# Patient Record
Sex: Female | Born: 1995 | Race: White | Hispanic: No | Marital: Single | Smoking: Never smoker
Health system: Southern US, Community
[De-identification: ages and names within clinical notes are randomized; demographics above are authoritative.]

## PROBLEM LIST (undated history)

## (undated) DIAGNOSIS — G90A Postural orthostatic tachycardia syndrome (POTS): Secondary | ICD-10-CM

## (undated) DIAGNOSIS — I498 Other specified cardiac arrhythmias: Secondary | ICD-10-CM

## (undated) DIAGNOSIS — R000 Tachycardia, unspecified: Secondary | ICD-10-CM

## (undated) DIAGNOSIS — I951 Orthostatic hypotension: Secondary | ICD-10-CM

## (undated) DIAGNOSIS — N390 Urinary tract infection, site not specified: Secondary | ICD-10-CM

## (undated) DIAGNOSIS — K3184 Gastroparesis: Secondary | ICD-10-CM

## (undated) DIAGNOSIS — R1115 Cyclical vomiting syndrome unrelated to migraine: Secondary | ICD-10-CM

## (undated) DIAGNOSIS — J45909 Unspecified asthma, uncomplicated: Secondary | ICD-10-CM

## (undated) DIAGNOSIS — J349 Unspecified disorder of nose and nasal sinuses: Secondary | ICD-10-CM

## (undated) DIAGNOSIS — Z9889 Other specified postprocedural states: Secondary | ICD-10-CM

## (undated) DIAGNOSIS — T8859XA Other complications of anesthesia, initial encounter: Secondary | ICD-10-CM

## (undated) DIAGNOSIS — Q796 Ehlers-Danlos syndrome, unspecified: Secondary | ICD-10-CM

## (undated) DIAGNOSIS — R112 Nausea with vomiting, unspecified: Secondary | ICD-10-CM

## (undated) DIAGNOSIS — R109 Unspecified abdominal pain: Secondary | ICD-10-CM

## (undated) HISTORY — PX: ADENOIDECTOMY: SHX3020

## (undated) HISTORY — DX: Unspecified disorder of nose and nasal sinuses: J34.9

## (undated) HISTORY — PX: WISDOM TOOTH EXTRACTION: SHX21

## (undated) HISTORY — PX: TONSILLECTOMY: SUR1361

## (undated) HISTORY — DX: Unspecified abdominal pain: R10.9

## (undated) HISTORY — DX: Other specified cardiac arrhythmias: I49.8

## (undated) HISTORY — PX: OTHER SURGICAL HISTORY: SHX169

---
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
## 2017-07-04 DIAGNOSIS — J45909 Unspecified asthma, uncomplicated: Secondary | ICD-10-CM | POA: Insufficient documentation

## 2017-07-04 DIAGNOSIS — Q796 Ehlers-Danlos syndrome, unspecified: Secondary | ICD-10-CM | POA: Insufficient documentation

## 2017-08-03 NOTE — Telephone Encounter (Signed)
Release of medical records.

## 2017-08-22 ENCOUNTER — Encounter: Payer: Self-pay | Admitting: Emergency Medicine

## 2017-08-22 DIAGNOSIS — I498 Other specified cardiac arrhythmias: Secondary | ICD-10-CM | POA: Diagnosis present

## 2017-08-22 DIAGNOSIS — Z87440 Personal history of urinary (tract) infections: Secondary | ICD-10-CM

## 2017-08-22 DIAGNOSIS — Z8249 Family history of ischemic heart disease and other diseases of the circulatory system: Secondary | ICD-10-CM | POA: Diagnosis not present

## 2017-08-22 DIAGNOSIS — Z30432 Encounter for removal of intrauterine contraceptive device: Secondary | ICD-10-CM

## 2017-08-22 DIAGNOSIS — R112 Nausea with vomiting, unspecified: Secondary | ICD-10-CM | POA: Diagnosis present

## 2017-08-22 DIAGNOSIS — E860 Dehydration: Secondary | ICD-10-CM | POA: Diagnosis present

## 2017-08-22 DIAGNOSIS — N3000 Acute cystitis without hematuria: Secondary | ICD-10-CM | POA: Diagnosis present

## 2017-08-22 LAB — COMPREHENSIVE METABOLIC PANEL
ALBUMIN: 5.1 g/dL — AB (ref 3.5–5.0)
ALT: 17 U/L (ref 14–54)
ANION GAP: 16 — AB (ref 5–15)
AST: 27 U/L (ref 15–41)
Alkaline Phosphatase: 75 U/L (ref 38–126)
BUN: 11 mg/dL (ref 6–20)
CHLORIDE: 100 mmol/L — AB (ref 101–111)
CO2: 20 mmol/L — ABNORMAL LOW (ref 22–32)
Calcium: 9.8 mg/dL (ref 8.9–10.3)
Creatinine, Ser: 0.87 mg/dL (ref 0.44–1.00)
GFR calc Af Amer: >60 mL/min (ref >60)
GLUCOSE: 112 mg/dL — AB (ref 65–99)
POTASSIUM: 3.6 mmol/L (ref 3.5–5.1)
Sodium: 136 mmol/L (ref 135–145)
Total Bilirubin: 1.4 mg/dL — ABNORMAL HIGH (ref 0.3–1.2)
Total Protein: 8.1 g/dL (ref 6.5–8.1)

## 2017-08-22 LAB — CBC
HEMATOCRIT: 43.3 % (ref 35.0–47.0)
HEMOGLOBIN: 14.7 g/dL (ref 12.0–16.0)
MCH: 29.6 pg (ref 26.0–34.0)
MCHC: 33.9 g/dL (ref 32.0–36.0)
MCV: 87.4 fL (ref 80.0–100.0)
Platelets: 321 10*3/uL (ref 150–440)
RBC: 4.96 MIL/uL (ref 3.80–5.20)
RDW: 12.9 % (ref 11.5–14.5)
WBC: 7.1 10*3/uL (ref 3.6–11.0)

## 2017-08-22 LAB — LIPASE, BLOOD: Lipase: 25 U/L (ref 11–51)

## 2017-08-22 NOTE — ED Triage Notes (Signed)
Pt reports she has had N/V x3 days as well as cramping. Pt reports she had IUD placed 2 weeks ago and the cramping feels related. Pt denies vaginal bleeding or discharge.

## 2017-08-23 ENCOUNTER — Encounter: Payer: Self-pay | Admitting: Emergency Medicine

## 2017-08-23 ENCOUNTER — Observation Stay
Admission: EM | Admit: 2017-08-23 | Discharge: 2017-08-26 | DRG: 392 | Disposition: A | Discharge status: Home / Self Care | Payer: 59 | Attending: Internal Medicine | Admitting: Internal Medicine

## 2017-08-23 ENCOUNTER — Emergency Department: Payer: 59

## 2017-08-23 ENCOUNTER — Other Ambulatory Visit: Payer: Self-pay

## 2017-08-23 DIAGNOSIS — R112 Nausea with vomiting, unspecified: Secondary | ICD-10-CM | POA: Diagnosis present

## 2017-08-23 DIAGNOSIS — N3000 Acute cystitis without hematuria: Secondary | ICD-10-CM

## 2017-08-23 DIAGNOSIS — R1030 Lower abdominal pain, unspecified: Secondary | ICD-10-CM

## 2017-08-23 HISTORY — DX: Tachycardia, unspecified: R00.0

## 2017-08-23 HISTORY — DX: Postural orthostatic tachycardia syndrome (POTS): G90.A

## 2017-08-23 HISTORY — DX: Other specified cardiac arrhythmias: I49.8

## 2017-08-23 HISTORY — DX: Orthostatic hypotension: I95.1

## 2017-08-23 LAB — TSH: TSH: 1.388 u[IU]/mL (ref 0.350–4.500)

## 2017-08-23 LAB — URINALYSIS, COMPLETE (UACMP) WITH MICROSCOPIC
Bilirubin Urine: NEGATIVE
Glucose, UA: NEGATIVE mg/dL
Ketones, ur: 80 mg/dL — AB
LEUKOCYTES UA: NEGATIVE
Nitrite: NEGATIVE
PH: 5 (ref 5.0–8.0)
PROTEIN: NEGATIVE mg/dL
Specific Gravity, Urine: 1.019 (ref 1.005–1.030)

## 2017-08-23 LAB — AMYLASE: Amylase: 54 U/L (ref 28–100)

## 2017-08-23 LAB — POCT PREGNANCY, URINE: Preg Test, Ur: NEGATIVE

## 2017-08-23 LAB — URINE DRUG SCREEN, QUALITATIVE (ARMC ONLY)
Amphetamines, Ur Screen: NOT DETECTED
BARBITURATES, UR SCREEN: NOT DETECTED
Benzodiazepine, Ur Scrn: NOT DETECTED
Cannabinoid 50 Ng, Ur ~~LOC~~: NOT DETECTED
Cocaine Metabolite,Ur ~~LOC~~: NOT DETECTED
MDMA (Ecstasy)Ur Screen: NOT DETECTED
METHADONE SCREEN, URINE: NOT DETECTED
OPIATE, UR SCREEN: NOT DETECTED
PHENCYCLIDINE (PCP) UR S: NOT DETECTED
Tricyclic, Ur Screen: NOT DETECTED

## 2017-08-23 LAB — LIPASE, BLOOD: Lipase: 26 U/L (ref 11–51)

## 2017-08-23 MED ORDER — ONDANSETRON HCL 4 MG/2ML IJ SOLN
4.0000 mg | Freq: Four times a day (QID) | INTRAMUSCULAR | Status: DC | PRN
  Administered 2017-08-23 – 2017-08-26 (×6): 4 mg via INTRAVENOUS
  Filled 2017-08-23 (×6): qty 2

## 2017-08-23 MED ORDER — METOCLOPRAMIDE HCL 5 MG/ML IJ SOLN
INTRAMUSCULAR | Status: AC
  Administered 2017-08-23: 10 mg via INTRAVENOUS
  Filled 2017-08-23: qty 2

## 2017-08-23 MED ORDER — HYDROCODONE-ACETAMINOPHEN 5-325 MG PO TABS: 1.0000 | ORAL_TABLET | ORAL | Status: DC | PRN

## 2017-08-23 MED ORDER — ACETAMINOPHEN 650 MG RE SUPP: 650.0000 mg | Freq: Four times a day (QID) | RECTAL | Status: DC | PRN

## 2017-08-23 MED ORDER — SODIUM CHLORIDE 0.9 % IV BOLUS (SEPSIS)
1000.0000 mL | Freq: Once | INTRAVENOUS | Status: AC
  Administered 2017-08-23: 1000 mL via INTRAVENOUS

## 2017-08-23 MED ORDER — ACETAMINOPHEN 325 MG PO TABS: 650.0000 mg | ORAL_TABLET | Freq: Four times a day (QID) | ORAL | Status: DC | PRN

## 2017-08-23 MED ORDER — SODIUM CHLORIDE 0.9 % IV SOLN
INTRAVENOUS | Status: DC
  Administered 2017-08-23 – 2017-08-25 (×7): via INTRAVENOUS

## 2017-08-23 MED ORDER — DEXTROSE 5 % IV SOLN
2.0000 g | INTRAVENOUS | Status: DC
  Administered 2017-08-24 – 2017-08-25 (×2): 2 g via INTRAVENOUS
  Filled 2017-08-23 (×2): qty 2

## 2017-08-23 MED ORDER — METOCLOPRAMIDE HCL 5 MG/ML IJ SOLN
10.0000 mg | Freq: Once | INTRAMUSCULAR | Status: AC
  Administered 2017-08-23: 10 mg via INTRAVENOUS

## 2017-08-23 MED ORDER — CEFTRIAXONE SODIUM IN DEXTROSE 20 MG/ML IV SOLN
1.0000 g | Freq: Once | INTRAVENOUS | Status: AC
  Administered 2017-08-23: 1 g via INTRAVENOUS
  Filled 2017-08-23: qty 50

## 2017-08-23 MED ORDER — PROMETHAZINE HCL 25 MG/ML IJ SOLN
25.0000 mg | Freq: Four times a day (QID) | INTRAMUSCULAR | Status: DC | PRN
  Administered 2017-08-23 – 2017-08-24 (×5): 25 mg via INTRAVENOUS
  Filled 2017-08-23 (×6): qty 1

## 2017-08-23 MED ORDER — ONDANSETRON HCL 4 MG PO TABS: 4.0000 mg | ORAL_TABLET | Freq: Four times a day (QID) | ORAL | Status: DC | PRN

## 2017-08-23 MED ORDER — IOPAMIDOL (ISOVUE-300) INJECTION 61%
75.0000 mL | Freq: Once | INTRAVENOUS | Status: AC | PRN
  Administered 2017-08-23: 75 mL via INTRAVENOUS

## 2017-08-23 MED ORDER — ONDANSETRON HCL 4 MG/2ML IJ SOLN
4.0000 mg | Freq: Once | INTRAMUSCULAR | Status: AC
  Administered 2017-08-23: 4 mg via INTRAVENOUS
  Filled 2017-08-23: qty 2

## 2017-08-23 MED ORDER — SODIUM CHLORIDE 0.9 % IV SOLN
8.0000 mg | Freq: Once | INTRAVENOUS | Status: AC
  Administered 2017-08-23: 8 mg via INTRAVENOUS
  Filled 2017-08-23: qty 4

## 2017-08-23 NOTE — ED Notes (Signed)
Pt reports pelvic pain that began 3 days ago, also states since then has had N/V. Pt states emesis is green in color at this time. Pt states she had IUD placed 2 weeks ago and reports "I don't know if that has something to do with it." Pt states hx of ovarian cyst rupture and POTS. Pt states yest she was told she had a UTI and was started on antibiotic yesterday. Pt alert and able to answer questions at this time. Pt's friends are at bedside and pt talking with them.

## 2017-08-23 NOTE — ED Notes (Signed)
Pt able to drink ginger ale, pt states she continues to have dry heaves however has not thrown up the ginger ale.

## 2017-08-23 NOTE — Progress Notes (Signed)
Same day rounding progress note  22 year old female patient with history of pots syndrome admitted with Intractable nausea and vomiting.  1.  Intractable nausea vomiting: unknown etio, zofran didn't help, added phenergan Mother concerned about IUD (Gyn Etio) and requestion 2nd opinion - d/w Dr Elesa MassedWard who will see pt - for now symptomatic mgmt. CT abd-Pelvis neg for acute patho - UDS neg - amylase, lipase normal 2.  Dehydration: continue IVFs till she can tolerate PO nutrition 3.  Acute cystitis: IV rocephin 4.  Pots syndrome: HR under control  D/W Mother at bedside.  Time spent: 35 mins

## 2017-08-23 NOTE — ED Notes (Signed)
Pt ambulated to toilet to urinate with little assistance.

## 2017-08-23 NOTE — ED Notes (Addendum)
Pt's mother called to inquire over wait and to voice concerns over pt's hx of pots; mother assured pt has been triaged and protocols completed, including labwork; also informed her that pt is going to next available exam room; pt is sitting in in w/c in lobby with no distress noted, talking with friends

## 2017-08-23 NOTE — ED Notes (Signed)
Pt reports she is dry heaving, pt has emesis bag by face however bag is empty at this time. EDP notified, see MAR for follow up.

## 2017-08-23 NOTE — Progress Notes (Signed)
Pharmacy Antibiotic Note  Tina Barr is a 22 y.o. female admitted on 08/23/2017 with UTI.  Pharmacy has been consulted for ceftriaxone dosing.  Plan: Ceftriaxone 2 grams q 24 hours ordered.  Height: 5\' 4"  (162.6 cm) Weight: 112 lb (50.8 kg) IBW/kg (Calculated) : 54.7  Temp (24hrs), Avg:97.9 F (36.6 C), Min:97.9 F (36.6 C), Max:97.9 F (36.6 C)  Recent Labs  Lab 08/22/17 2326  WBC 7.1  CREATININE 0.87    Estimated Creatinine Clearance: 83.4 mL/min (by C-G formula based on SCr of 0.87 mg/dL).    No Known Allergies  Antimicrobials this admission: Ceftriaxone 1/8  >>    >>   Dose adjustments this admission:   Microbiology results: No micro      1/8 UA: LE(-) NO2(-)  WBC 6-30 Thank you for allowing pharmacy to be a part of this patient's care.  Tina Barr S 08/23/2017 7:11 AM

## 2017-08-23 NOTE — H&P (Signed)
[facility] [place] [facility]   PATIENT NAME: Tina RedderMolly Barr    MR#:  [id number]  DATE OF BIRTH:  May 16, 1996  DATE OF ADMISSION:  08/23/2017  PRIMARY CARE PHYSICIAN: System, Pcp Not In   REQUESTING/REFERRING PHYSICIAN:   CHIEF COMPLAINT:   Chief Complaint  Patient presents with  . Emesis    HISTORY OF PRESENT ILLNESS: Tina RedderMolly Barr  is a 22 y.o. female with a known history of postural orthostatic tachycardia syndrome presented to the emergency room with nausea and vomiting for the last 2 days.  Patient has intractable nausea and vomiting.  Unable to eat any food and drink any fluids because of the nausea and vomiting.  She also had dysuria and was being treated with oral Bactrim for urinary tract infection as outpatient.  She [facility] [employer].  No complaints of any abdominal pain.  Generalized weakness present .  Appears dry and dehydrated.  Patient was worked up with CT abdomen which showed bladder wall thickening and cystitis.  Intrauterine device noted. hospitalist service was consulted.  PAST MEDICAL HISTORY:   Past Medical History:  Diagnosis Date  . POTS (postural orthostatic tachycardia syndrome)     PAST SURGICAL HISTORY:  Past Surgical History:  Procedure Laterality Date  . none      SOCIAL HISTORY:  Social History   Tobacco Use  . Smoking status: Never Smoker  . Smokeless tobacco: Never Used  Substance Use Topics  . Alcohol use: Yes    FAMILY HISTORY:  Family History  Problem Relation Age of Onset  . Heart disease Father   . Diabetes Mellitus II Neg Hx   . Hypertension Neg Hx     DRUG ALLERGIES: No Known Allergies  REVIEW OF SYSTEMS:   CONSTITUTIONAL: No fever, has weakness.  EYES: No blurred or double vision.  EARS, NOSE, AND THROAT: No tinnitus or ear pain.  RESPIRATORY: No cough, shortness of breath, wheezing or hemoptysis.  CARDIOVASCULAR: No chest pain, orthopnea, edema.   GASTROINTESTINAL: Has nausea, vomiting,  No diarrhea or abdominal pain.  GENITOURINARY: Has dysuria, no hematuria.  ENDOCRINE: No polyuria, nocturia,  HEMATOLOGY: No anemia, easy bruising or bleeding SKIN: No rash or lesion. MUSCULOSKELETAL: No joint pain or arthritis.   NEUROLOGIC: No tingling, numbness, weakness.  PSYCHIATRY: No anxiety or depression.   MEDICATIONS AT HOME:  Prior to Admission medications   Medication Sig Start Date End Date Taking? Authorizing Provider  Levonorgestrel (KYLEENA) 19.5 MG IUD Kyleena 17.5 mcg/24 hour (5 years) intrauterine device  Take 1 device by intrauterine route.   Yes [provider]  nitrofurantoin, macrocrystal-monohydrate, (MACROBID) 100 MG capsule Take 100 mg by mouth 2 (two) times daily. 08/21/17  Yes [provider]  ondansetron (ZOFRAN-ODT) 4 MG disintegrating tablet Take 4 mg by mouth every 8 (eight) hours. 08/12/17  Yes [provider]  metoprolol succinate (TOPROL-XL) 25 MG 24 hr tablet Take 1 tablet by mouth daily.    [provider]  NORTREL 1/35, 21, tablet Take 1 tablet by mouth daily. 07/18/17   [provider]      PHYSICAL EXAMINATION:   VITAL SIGNS: Blood pressure (!) 130/94, pulse (!) 108, temperature 97.9 F (36.6 C), temperature source Oral, resp. rate 18, height 5\' 4"  (1.626 m), weight 50.8 kg (112 lb), SpO2 99 %.  GENERAL:  22 y.o.-year-old patient lying in the bed with no acute distress.  EYES: Pupils equal, round, reactive to light and  accommodation. No scleral icterus. Extraocular muscles intact.  HEENT: Head atraumatic, normocephalic. Oropharynx dry and nasopharynx clear.  NECK:  Supple, no jugular venous distention. No thyroid enlargement, no tenderness.  LUNGS: Normal breath sounds bilaterally, no wheezing, rales,rhonchi or crepitation. No use of accessory muscles of respiration.  CARDIOVASCULAR: S1, S2 tachycardia noted. No murmurs, rubs, or gallops.  ABDOMEN: Soft,  nontender, nondistended. Bowel sounds present. No organomegaly or mass.  EXTREMITIES: No pedal edema, cyanosis, or clubbing.  NEUROLOGIC: Cranial nerves II through XII are intact. Muscle strength 5/5 in all extremities. Sensation intact. Gait not checked.  PSYCHIATRIC: The patient is alert and oriented x 3.  SKIN: No obvious rash, lesion, or ulcer.   LABORATORY PANEL:   CBC Recent Labs  Lab 08/22/17 2326  WBC 7.1  HGB 14.7  HCT 43.3  PLT 321  MCV 87.4  MCH 29.6  MCHC 33.9  RDW 12.9   ------------------------------------------------------------------------------------------------------------------  Chemistries  Recent Labs  Lab 08/22/17 2326  NA 136  K 3.6  CL 100*  CO2 20*  GLUCOSE 112*  BUN 11  CREATININE 0.87  CALCIUM 9.8  AST 27  ALT 17  ALKPHOS 75  BILITOT 1.4*   ------------------------------------------------------------------------------------------------------------------ estimated creatinine clearance is 83.4 mL/min (by C-G formula based on SCr of 0.87 mg/dL). ------------------------------------------------------------------------------------------------------------------ No results for input(s): TSH, T4TOTAL, T3FREE, THYROIDAB in the last 72 hours.  Invalid input(s): FREET3   Coagulation profile No results for input(s): INR, PROTIME in the last 168 hours. ------------------------------------------------------------------------------------------------------------------- No results for input(s): DDIMER in the last 72 hours. -------------------------------------------------------------------------------------------------------------------  Cardiac Enzymes No results for input(s): CKMB, TROPONINI, MYOGLOBIN in the last 168 hours.  Invalid input(s): CK ------------------------------------------------------------------------------------------------------------------ Invalid input(s):  POCBNP  ---------------------------------------------------------------------------------------------------------------  Urinalysis    Component Value Date/Time   COLORURINE YELLOW (A) 08/23/2017 0230   APPEARANCEUR HAZY (A) 08/23/2017 0230   LABSPEC 1.019 08/23/2017 0230   PHURINE 5.0 08/23/2017 0230   GLUCOSEU NEGATIVE 08/23/2017 0230   HGBUR SMALL (A) 08/23/2017 0230   BILIRUBINUR NEGATIVE 08/23/2017 0230   KETONESUR 80 (A) 08/23/2017 0230   PROTEINUR NEGATIVE 08/23/2017 0230   NITRITE NEGATIVE 08/23/2017 0230   LEUKOCYTESUR NEGATIVE 08/23/2017 0230     RADIOLOGY: Ct Abdomen Pelvis W Contrast  Result Date: 08/23/2017 CLINICAL DATA:  22 y/o F; pelvic pain beginning 3 days ago with nausea and vomiting. IUD placed 2 weeks ago. Currently being treated for urinary tract infection. EXAM: CT ABDOMEN AND PELVIS WITH CONTRAST TECHNIQUE: Multidetector CT imaging of the abdomen and pelvis was performed using the standard protocol following bolus administration of intravenous contrast. CONTRAST:  75mL ISOVUE-300 IOPAMIDOL (ISOVUE-300) INJECTION 61% COMPARISON:  None. FINDINGS: Lower chest: No acute abnormality. Hepatobiliary: No focal liver abnormality is seen. No gallstones, gallbladder wall thickening, or biliary dilatation. Pancreas: Unremarkable. No pancreatic ductal dilatation or surrounding inflammatory changes. Spleen: Normal in size without focal abnormality. Adrenals/Urinary Tract: Adrenal glands are unremarkable. Kidneys are normal, without renal calculi, focal lesion, or hydronephrosis. Mild wall thickening of the urinary bladder. Stomach/Bowel: Stomach is within normal limits. Appendix appears normal. No evidence of bowel wall thickening, distention, or inflammatory changes. Vascular/Lymphatic: No significant vascular findings are present. No enlarged abdominal or pelvic lymph nodes. Reproductive: Uterus and bilateral adnexa are unremarkable. IUD is well seated within the uterine fundus.  Other: No abdominal wall hernia or abnormality. No abdominopelvic ascites. Musculoskeletal: No acute or significant osseous findings. IMPRESSION: 1. Mild wall thickening of the urinary bladder may represent cystitis. 2. IUD well-seated in uterine fundus. 3. Otherwise unremarkable CT  of abdomen and pelvis. Electronically Signed   By: Mitzi Hansen M.D.   On: 08/23/2017 03:12    EKG: No orders found for this or any previous visit.  IMPRESSION AND PLAN: 22 year old female patient with history of pots syndrome presented to the emergency room with nausea and vomiting.  Admitting diagnosis 1.  Intractable nausea vomiting 2.  Dehydration 3.  Acute cystitis 4.  Pots syndrome Treatment plan Admit patient to medical floor observation bed IV fluid hydration Antiemetics IV Rocephin antibiotic Supportive care  All the records are reviewed and case discussed with ED provider. Management plans discussed with the patient, family and they are in agreement.  CODE STATUS: Code Status History    This patient does not have a recorded code status. Please follow your organizational policy for patients in this situation.       TOTAL TIME TAKING CARE OF THIS PATIENT: 50 minutes.    Ihor Austin M.D on 08/23/2017 at 6:48 AM  Between 7am to 6pm - Pager - [phone]  After 6pm go to [email or web address] - password [email or web address]  [facility]  Office  [phone]  CC: Primary care physician; System, Pcp Not In

## 2017-08-23 NOTE — ED Provider Notes (Signed)
[facility] Emergency Department Provider Note   ____________________________________________   First MD Initiated Contact with Patient 08/23/17 0134     (approximate)  I have reviewed the triage vital signs and the nursing notes.   HISTORY  Chief Complaint Emesis    HPI Tina Barr is a 22 y.o. female who comes into the hospital today with some nausea vomiting.  The patient states that it started 3 days ago.  She has an IUD and she reports that the cramps is in her lower abdomen near her IUD.  She denies any diarrhea and reports that her pain is currently a 5 out of 10 in intensity.  The patient states that she has vomited innumerable amounts of time and its green-looking emesis.  The patient has not had a bowel movement in the last few days.  She states that she has had a ruptured ovarian cyst in the past and some endometriosis.  The patient denies any sick contacts.  The patient states that she was diagnosed with a UTI yesterday and she is currently under treatment with Bactrim.  She is here today as she is been unable to keep anything down at home.   Past Medical History:  Diagnosis Date  . POTS (postural orthostatic tachycardia syndrome)     Patient Active Problem List   Diagnosis Date Noted  . Nausea & vomiting 08/23/2017    Past Surgical History:  Procedure Laterality Date  . none      Prior to Admission medications   Medication Sig Start Date End Date Taking? Authorizing Provider  Levonorgestrel (KYLEENA) 19.5 MG IUD Kyleena 17.5 mcg/24 hour (5 years) intrauterine device  Take 1 device by intrauterine route.   Yes [provider]  nitrofurantoin, macrocrystal-monohydrate, (MACROBID) 100 MG capsule Take 100 mg by mouth 2 (two) times daily. 08/21/17  Yes [provider]  ondansetron (ZOFRAN-ODT) 4 MG disintegrating tablet Take 4 mg by mouth every 8 (eight) hours. 08/12/17  Yes [provider]  metoprolol succinate  (TOPROL-XL) 25 MG 24 hr tablet Take 1 tablet by mouth daily.    [provider]  NORTREL 1/35, 21, tablet Take 1 tablet by mouth daily. 07/18/17   [provider]    Allergies Patient has no known allergies.  Family History  Problem Relation Age of Onset  . Heart disease Father   . Diabetes Mellitus II Neg Hx   . Hypertension Neg Hx     Social History Social History   Tobacco Use  . Smoking status: Never Smoker  . Smokeless tobacco: Never Used  Substance Use Topics  . Alcohol use: Yes  . Drug use: No    Review of Systems  Constitutional: No fever/chills Eyes: No visual changes. ENT: No sore throat. Cardiovascular: Denies chest pain. Respiratory: Denies shortness of breath. Gastrointestinal:  abdominal pain. ausea, vomiting.  No diarrhea.  No constipation. Genitourinary: Negative for dysuria. Musculoskeletal: Negative for back pain. Skin: Negative for rash. Neurological: Negative for headaches, focal weakness or numbness.   ____________________________________________   PHYSICAL EXAM:  VITAL SIGNS: ED Triage Vitals  Enc Vitals Group     BP 08/22/17 2330 123/66     Pulse Rate 08/22/17 2330 (!) 110     Resp 08/22/17 2330 18     Temp 08/22/17 2330 97.9 F (36.6 C)     Temp Source 08/22/17 2330 Oral     SpO2 08/22/17 2330 100 %     Weight 08/22/17 2330 112 lb (50.8 kg)  Height 08/22/17 2330 5\' 4"  (1.626 m)     Head Circumference --      Peak Flow --      Pain Score 08/23/17 0136 5     Pain Loc --      Pain Edu? --      Excl. in GC? --     Constitutional: Alert and oriented. Well appearing and in moderate distress. Eyes: Conjunctivae are normal. PERRL. EOMI. Head: Atraumatic. Nose: No congestion/rhinnorhea. Mouth/Throat: Mucous membranes are moist.  Oropharynx non-erythematous. Cardiovascular: Tachycardia, regular rhythm. Grossly normal heart sounds.  Good peripheral circulation. Respiratory: Normal respiratory effort.  No  retractions. Lungs CTAB. Gastrointestinal: Soft with some mild mid lower abdominal tenderness to palpation. No distention.  Positive bowel sounds Musculoskeletal: No lower extremity tenderness nor edema.   Neurologic:  Normal speech and language.  Skin:  Skin is warm, dry and intact. Psychiatric: Mood and affect are normal.   ____________________________________________   LABS (all labs ordered are listed, but only abnormal results are displayed)  Labs Reviewed  COMPREHENSIVE METABOLIC PANEL - Abnormal; Notable for the following components:      Result Value   Chloride 100 (*)    CO2 20 (*)    Glucose, Bld 112 (*)    Albumin 5.1 (*)    Total Bilirubin 1.4 (*)    Anion gap 16 (*)    All other components within normal limits  URINALYSIS, COMPLETE (UACMP) WITH MICROSCOPIC - Abnormal; Notable for the following components:   Color, Urine YELLOW (*)    APPearance HAZY (*)    Hgb urine dipstick SMALL (*)    Ketones, ur 80 (*)    Bacteria, UA RARE (*)    Squamous Epithelial / LPF 6-30 (*)    All other components within normal limits  LIPASE, BLOOD  CBC  POC URINE PREG, ED  POCT PREGNANCY, URINE   ____________________________________________  EKG  none ____________________________________________  RADIOLOGY  Ct Abdomen Pelvis W Contrast  Result Date: 08/23/2017 CLINICAL DATA:  22 y/o F; pelvic pain beginning 3 days ago with nausea and vomiting. IUD placed 2 weeks ago. Currently being treated for urinary tract infection. EXAM: CT ABDOMEN AND PELVIS WITH CONTRAST TECHNIQUE: Multidetector CT imaging of the abdomen and pelvis was performed using the standard protocol following bolus administration of intravenous contrast. CONTRAST:  75mL ISOVUE-300 IOPAMIDOL (ISOVUE-300) INJECTION 61% COMPARISON:  None. FINDINGS: Lower chest: No acute abnormality. Hepatobiliary: No focal liver abnormality is seen. No gallstones, gallbladder wall thickening, or biliary dilatation. Pancreas:  Unremarkable. No pancreatic ductal dilatation or surrounding inflammatory changes. Spleen: Normal in size without focal abnormality. Adrenals/Urinary Tract: Adrenal glands are unremarkable. Kidneys are normal, without renal calculi, focal lesion, or hydronephrosis. Mild wall thickening of the urinary bladder. Stomach/Bowel: Stomach is within normal limits. Appendix appears normal. No evidence of bowel wall thickening, distention, or inflammatory changes. Vascular/Lymphatic: No significant vascular findings are present. No enlarged abdominal or pelvic lymph nodes. Reproductive: Uterus and bilateral adnexa are unremarkable. IUD is well seated within the uterine fundus. Other: No abdominal wall hernia or abnormality. No abdominopelvic ascites. Musculoskeletal: No acute or significant osseous findings. IMPRESSION: 1. Mild wall thickening of the urinary bladder may represent cystitis. 2. IUD well-seated in uterine fundus. 3. Otherwise unremarkable CT of abdomen and pelvis. Electronically Signed   By: Mitzi HansenLance  Furusawa-Stratton M.D.   On: 08/23/2017 03:12    ____________________________________________   PROCEDURES  Procedure(s) performed: None  Procedures  Critical Care performed: No  ____________________________________________   INITIAL IMPRESSION /  ASSESSMENT AND PLAN / ED COURSE  As part of my medical decision making, I reviewed the following data within the electronic MEDICAL RECORD NUMBER Notes from prior ED visits and  Controlled Substance Database   This is a 22 year old female who comes into the hospital today with some nausea and vomiting.  The patient also has some lower abdominal pain.  Differential diagnosis includes colitis, gastroenteritis, bowel obstruction, pyelonephritis.  We did check some blood work on the patient which was unremarkable.  The patient did have an anion gap of 16 which is concerning for possible dehydration.  Her ketones were also 80.  She was tachycardic as well  on evaluation.  I initially gave the patient some Zofran and then I did give the patient some Reglan.  She received a liter of normal saline and we tried to do a p.o. trial.  The patient started having dry heaves after the p.o. trial.  I gave her Reglan but she still was having a lot of nausea.  The patient received a dose of ceftriaxone to help treat her UTI.  I feel that the patient may have some nausea and vomiting due to her urinary tract infection.  She received a CT scan which showed some thickening of her bladder concerning for cystitis.  Since patient is still having nausea and she will be admitted to the hospitalist service.      ____________________________________________   FINAL CLINICAL IMPRESSION(S) / ED DIAGNOSES  Final diagnoses:  Acute cystitis without hematuria  Intractable vomiting with nausea, unspecified vomiting type  Lower abdominal pain     ED Discharge Orders    None       Note:  This document was prepared using Dragon voice recognition software and may include unintentional dictation errors.    Rebecka Apley, MD 08/23/17 [phone]

## 2017-08-23 NOTE — ED Notes (Signed)
Patient transported to CT 

## 2017-08-23 NOTE — Progress Notes (Signed)
Notified Dr. Sherryll BurgerShah that patient's primary care physician had recommended zofran 8mg  instead of the phenergan. Per Dr. Sherryll BurgerShah okay to place 1 time dose of zofran 8mg  at the next time her PRN dose would be due to try.

## 2017-08-23 NOTE — ED Notes (Signed)
Pt aware urine sample is needed, verbalizes understanding.

## 2017-08-24 LAB — BASIC METABOLIC PANEL
ANION GAP: 11 (ref 5–15)
BUN: <5 mg/dL — ABNORMAL LOW (ref 6–20)
CHLORIDE: 105 mmol/L (ref 101–111)
CO2: 20 mmol/L — ABNORMAL LOW (ref 22–32)
Calcium: 8.9 mg/dL (ref 8.9–10.3)
Creatinine, Ser: 0.64 mg/dL (ref 0.44–1.00)
GFR calc Af Amer: >60 mL/min (ref >60)
GLUCOSE: 78 mg/dL (ref 65–99)
POTASSIUM: 3.4 mmol/L — AB (ref 3.5–5.1)
Sodium: 136 mmol/L (ref 135–145)

## 2017-08-24 LAB — URINE CULTURE
Culture: NO GROWTH
Special Requests: NORMAL

## 2017-08-24 LAB — URINALYSIS, ROUTINE W REFLEX MICROSCOPIC
Bilirubin Urine: NEGATIVE
GLUCOSE, UA: NEGATIVE mg/dL
Ketones, ur: 20 mg/dL — AB
Leukocytes, UA: NEGATIVE
Nitrite: NEGATIVE
PH: 5 (ref 5.0–8.0)
Protein, ur: NEGATIVE mg/dL
Specific Gravity, Urine: 1.01 (ref 1.005–1.030)

## 2017-08-24 LAB — CBC
HEMATOCRIT: 37.7 % (ref 35.0–47.0)
HEMOGLOBIN: 12.7 g/dL (ref 12.0–16.0)
MCH: 30 pg (ref 26.0–34.0)
MCHC: 33.7 g/dL (ref 32.0–36.0)
MCV: 89 fL (ref 80.0–100.0)
Platelets: 258 10*3/uL (ref 150–440)
RBC: 4.24 MIL/uL (ref 3.80–5.20)
RDW: 12.9 % (ref 11.5–14.5)
WBC: 7.3 10*3/uL (ref 3.6–11.0)

## 2017-08-24 LAB — MAGNESIUM: Magnesium: 1.7 mg/dL (ref 1.7–2.4)

## 2017-08-24 LAB — PHOSPHORUS: Phosphorus: 3.9 mg/dL (ref 2.5–4.6)

## 2017-08-24 LAB — HIV ANTIBODY (ROUTINE TESTING W REFLEX): HIV SCREEN 4TH GENERATION: NONREACTIVE

## 2017-08-24 MED ORDER — ENSURE ENLIVE PO LIQD: 237.0000 mL | Freq: Two times a day (BID) | ORAL | Status: DC

## 2017-08-24 MED ORDER — NORETHIN-ETH ESTRADIOL-FE 0.4-35 MG-MCG PO CHEW
1.0000 | CHEWABLE_TABLET | Freq: Every day | ORAL | Status: DC
  Administered 2017-08-25: 1 via ORAL
  Filled 2017-08-24: qty 1

## 2017-08-24 MED ORDER — METOCLOPRAMIDE HCL 5 MG/ML IJ SOLN
10.0000 mg | Freq: Four times a day (QID) | INTRAMUSCULAR | Status: DC
  Administered 2017-08-24 – 2017-08-26 (×9): 10 mg via INTRAVENOUS
  Filled 2017-08-24 (×9): qty 2

## 2017-08-24 MED ORDER — KETOROLAC TROMETHAMINE 15 MG/ML IJ SOLN
15.0000 mg | Freq: Four times a day (QID) | INTRAMUSCULAR | Status: DC
  Administered 2017-08-24 – 2017-08-25 (×4): 15 mg via INTRAVENOUS
  Filled 2017-08-24 (×4): qty 1

## 2017-08-24 NOTE — Progress Notes (Signed)
1        [facility] [place] [facility]   PATIENT NAME: Tina RedderMolly Barr    MR#:  [id number]  DATE OF BIRTH:  [birth year]  SUBJECTIVE:  CHIEF COMPLAINT:   Chief Complaint  Patient presents with  . Emesis  Patient was feeling somewhat better this morning but again worse this afternoon REVIEW OF SYSTEMS:  Review of Systems  Constitutional: Negative for chills, fever and weight loss.  HENT: Negative for nosebleeds and sore throat.   Eyes: Negative for blurred vision.  Respiratory: Negative for cough, shortness of breath and wheezing.   Cardiovascular: Negative for chest pain, orthopnea, leg swelling and PND.  Gastrointestinal: Positive for nausea and vomiting. Negative for abdominal pain, constipation, diarrhea and heartburn.  Genitourinary: Negative for dysuria and urgency.  Musculoskeletal: Negative for back pain.  Skin: Negative for rash.  Neurological: Negative for dizziness, speech change, focal weakness and headaches.  Endo/Heme/Allergies: Does not bruise/bleed easily.  Psychiatric/Behavioral: Negative for depression.    DRUG ALLERGIES:  No Known Allergies VITALS:  Blood pressure (!) 137/93, pulse (!) 108, temperature 98.2 F (36.8 C), resp. rate 14, height 5\' 4"  (1.626 m), weight 50.8 kg (112 lb), SpO2 99 %. PHYSICAL EXAMINATION:  Physical Exam  Constitutional: She is oriented to person, place, and time and well-developed, well-nourished, and in no distress.  HENT:  Head: Normocephalic and atraumatic.  Eyes: Conjunctivae and EOM are normal. Pupils are equal, round, and reactive to light.  Neck: Normal range of motion. Neck supple. No tracheal deviation present. No thyromegaly present.  Cardiovascular: Normal rate, regular rhythm and normal heart sounds.  Pulmonary/Chest: Effort normal and breath sounds normal. No respiratory distress. She has no wheezes. She exhibits no tenderness.  Abdominal: Soft. Bowel sounds are normal. She exhibits no  distension. There is no tenderness.  Musculoskeletal: Normal range of motion.  Neurological: She is alert and oriented to person, place, and time. No cranial nerve deficit.  Skin: Skin is warm and dry. No rash noted.  Psychiatric: Mood and affect normal.   LABORATORY PANEL:  Female CBC Recent Labs  Lab 08/24/17 0419  WBC 7.3  HGB 12.7  HCT 37.7  PLT 258   ------------------------------------------------------------------------------------------------------------------ Chemistries  Recent Labs  Lab 08/22/17 2326 08/24/17 0419  NA 136 136  K 3.6 3.4*  CL 100* 105  CO2 20* 20*  GLUCOSE 112* 78  BUN 11 <5*  CREATININE 0.87 0.64  CALCIUM 9.8 8.9  MG  --  1.7  AST 27  --   ALT 17  --   ALKPHOS 75  --   BILITOT 1.4*  --    RADIOLOGY:  No results found. ASSESSMENT AND PLAN:  22 year old female patient with history of pots syndrome admitted with Intractable nausea and vomiting.  1.  Intractable nausea vomiting: unknown etio, zofran, reglan & phenergan PRN - CT abd-Pelvis neg for acute patho - UDS neg - amylase, lipase normal -Initiate GYN assistance, IUD removed yesterday, patient was feeling much better this morning but again worse this afternoon per her mother.  Mother requesting CT head and GI consultation which I have asked her to hold off today and if patient is not better tomorrow or worse we will order such 2.  Dehydration: continue IVFs till she can tolerate PO nutrition 3.  Acute cystitis: IV rocephin 4.  Pots syndrome: HR under control  D/W Mother at bedside.     All the records are reviewed and case discussed with Care Management/Social Worker.  Management plans discussed with the patient, family (mother at bedside) and they are in agreement.  CODE STATUS: Full Code  TOTAL TIME TAKING CARE OF THIS PATIENT: 35 minutes.   More than 50% of the time was spent in counseling/coordination of care: YES  POSSIBLE D/C IN 1-2 DAYS, DEPENDING ON CLINICAL  CONDITION.   Delfino Lovett M.D on 08/24/2017 at 4:19 PM  Between 7am to 6pm - Pager - [phone]  After 6pm go to [email or web address] - Social research officer, government  [facility] [place] [facility]  Office  [phone]  CC: Primary care physician; System, Pcp Not In  Note: This dictation was prepared with Dragon dictation along with smaller phrase technology. Any transcriptional errors that result from this process are unintentional.

## 2017-08-24 NOTE — Consult Note (Addendum)
Consult History and Physical   SERVICE: Gynecology   Patient Name: Tina Barr Patient MRN:   [id number]  CC: intractable nausea and vomiting  HPI: Tina Barr is a 22 y.o. G0 with admission today for 3 day history of nausea and vomiting.  Recent hx of UTI s/p tx and medical history of Ehlers Danlos and POTS.   CT+ for thickened bladder wall, no other significant findings, fair amount of stool in the sigmoid. IUD noted in uterus at fundus. Reviewed by me.  Thus far workup has not shown obvious etiology of her NV.   She has long painful periods, gets nauseated 2 days prior to onset of menses, during which she remains nauseated.  She used to take q21day OCPs (Nortrel) and mid-December switched to the [place] IUD.  She cramped for about 1.5 weeks.   She had negative testing for GCCT in the last couple of months.  Is sexually active.    Review of Systems: positives in bold GEN:   fevers, chills, weight changes, appetite changes, fatigue, night sweats HEENT:  HA, vision changes, hearing loss, congestion, rhinorrhea, sinus pressure, dysphagia CV:   CP, palpitations PULM:  SOB, cough GI:  abd pain, N/V/D/C GU:  dysuria, urgency, frequency MSK:  arthralgias, myalgias, back pain, swelling SKIN:  rashes, color changes, pallor NEURO:  numbness, weakness, tingling, seizures, dizziness, tremors PSYCH:  depression, anxiety, behavioral problems, confusion  HEME/LYMPH:  easy bruising or bleeding ENDO:  heat/cold intolerance  Past Obstetrical History: OB History    No data available      Past Gynecologic History: No LMP recorded. Patient is not currently having periods (Reason: IUD).   Past Medical History: Past Medical History:  Diagnosis Date  . POTS (postural orthostatic tachycardia syndrome)     Past Surgical History:   Past Surgical History:  Procedure Laterality Date  . none    . TONSILLECTOMY  2009    Family History:  family history includes Heart disease in her  father; Skin cancer in her father and mother.  Social History:  Social History   Socioeconomic History  . Marital status: Single    Spouse name: Not on file  . Number of children: Not on file  . Years of education: Not on file  . Highest education level: Not on file  Social Needs  . Financial resource strain: Not on file  . Food insecurity - worry: Not on file  . Food insecurity - inability: Not on file  . Transportation needs - medical: Not on file  . Transportation needs - non-medical: Not on file  Occupational History  . Not on file  Tobacco Use  . Smoking status: Never Smoker  . Smokeless tobacco: Never Used  Substance and Sexual Activity  . Alcohol use: Yes    Comment: Just on weekends  . Drug use: No  . Sexual activity: No  Other Topics Concern  . Not on file  Social History Narrative  . Not on file    Home Medications:  Medications reconciled in EPIC  No current facility-administered medications on file prior to encounter.    Current Outpatient Medications on File Prior to Encounter  Medication Sig Dispense Refill  . Levonorgestrel (KYLEENA) 19.5 MG IUD Kyleena 17.5 mcg/24 hour (5 years) intrauterine device  Take 1 device by intrauterine route.    . nitrofurantoin, macrocrystal-monohydrate, (MACROBID) 100 MG capsule Take 100 mg by mouth 2 (two) times daily.  0  . ondansetron (ZOFRAN-ODT) 4 MG disintegrating tablet Take 4 mg  by mouth every 8 (eight) hours.  0  . metoprolol succinate (TOPROL-XL) 25 MG 24 hr tablet Take 1 tablet by mouth daily.    Marland Kitchen. NORTREL 1/35, 21, tablet Take 1 tablet by mouth daily.      Allergies:  No Known Allergies  Physical Exam:  Temp:  [98.6 F (37 C)] 98.6 F (37 C) (01/08 2035) Pulse Rate:  [92-114] 106 (01/08 2035) Resp:  [16-18] 16 (01/08 2035) BP: (109-144)/(84-100) 134/84 (01/08 2035) SpO2:  [95 %-100 %] 100 % (01/08 2035)   General Appearance:  Well developed, well nourished, no acute distress, alert and oriented,  cooperative and appears stated age HEENT:  Normocephalic atraumatic, extraocular movements intact, moist mucous membranes, neck supple with midline trachea and thyroid without masses Cardiovascular:  No peripheral edema Pulmonary:  Normal respiratory effort Abdomen:  soft, nontender, nondistended, no abnormal masses  Extremities:  no cyanosis or edema Skin:  normal coloration and turgor, no rashes, no suspicious skin lesions noted  Neurologic:  Cranial nerves 2-12 grossly intact, grossly equal strength and muscle tone, normal speech, no focal findings or movement disorder noted. Psychiatric:  Normal mood and affect, appropriate, no AH/VH Pelvic:  NEFG, no vulvar masses or lesions, normal vaginal mucosa, no vaginal bleeding or discharge, cervix slightly tender, bladder tender, IUD strings palpated, and per patient request IUD removed.  IUD removal:  Strings palpated and gentle traction given  Labs/Studies:   Results for orders placed or performed during the hospital encounter of 08/23/17 (from the past 24 hour(s))  Urinalysis, Complete w Microscopic     Status: Abnormal   Collection Time: 08/23/17  2:30 AM  Result Value Ref Range   Color, Urine YELLOW (A) YELLOW   APPearance HAZY (A) CLEAR   Specific Gravity, Urine 1.019 1.005 - 1.030   pH 5.0 5.0 - 8.0   Glucose, UA NEGATIVE NEGATIVE mg/dL   Hgb urine dipstick SMALL (A) NEGATIVE   Bilirubin Urine NEGATIVE NEGATIVE   Ketones, ur 80 (A) NEGATIVE mg/dL   Protein, ur NEGATIVE NEGATIVE mg/dL   Nitrite NEGATIVE NEGATIVE   Leukocytes, UA NEGATIVE NEGATIVE   RBC / HPF 0-5 0 - 5 RBC/hpf   WBC, UA 6-30 0 - 5 WBC/hpf   Bacteria, UA RARE (A) NONE SEEN   Squamous Epithelial / LPF 6-30 (A) NONE SEEN   Mucus PRESENT   Urine Drug Screen, Qualitative ([facility] only)     Status: None   Collection Time: 08/23/17  2:34 AM  Result Value Ref Range   Tricyclic, Ur Screen NONE DETECTED NONE DETECTED   Amphetamines, Ur Screen NONE DETECTED NONE DETECTED    MDMA (Ecstasy)Ur Screen NONE DETECTED NONE DETECTED   Cocaine Metabolite,Ur [place] NONE DETECTED NONE DETECTED   Opiate, Ur Screen NONE DETECTED NONE DETECTED   Phencyclidine (PCP) Ur S NONE DETECTED NONE DETECTED   Cannabinoid [address], Ur [place] NONE DETECTED NONE DETECTED   Barbiturates, Ur Screen NONE DETECTED NONE DETECTED   Benzodiazepine, Ur Scrn NONE DETECTED NONE DETECTED   Methadone Scn, Ur NONE DETECTED NONE DETECTED  Pregnancy, urine POC     Status: None   Collection Time: 08/23/17  2:38 AM  Result Value Ref Range   Preg Test, Ur NEGATIVE NEGATIVE  Lipase, blood     Status: None   Collection Time: 08/23/17  8:21 AM  Result Value Ref Range   Lipase 26 11 - 51 U/L  Amylase     Status: None   Collection Time: 08/23/17  8:21 AM  Result Value Ref Range   Amylase 54 28 - 100 U/L  TSH     Status: None   Collection Time: 08/23/17  8:21 AM  Result Value Ref Range   TSH 1.388 0.350 - 4.500 uIU/mL    Other Imaging: Ct Abdomen Pelvis W Contrast  Result Date: 08/23/2017 CLINICAL DATA:  22 y/o F; pelvic pain beginning 3 days ago with nausea and vomiting. IUD placed 2 weeks ago. Currently being treated for urinary tract infection. EXAM: CT ABDOMEN AND PELVIS WITH CONTRAST TECHNIQUE: Multidetector CT imaging of the abdomen and pelvis was performed using the standard protocol following bolus administration of intravenous contrast. CONTRAST:  75mL ISOVUE-300 IOPAMIDOL (ISOVUE-300) INJECTION 61% COMPARISON:  None. FINDINGS: Lower chest: No acute abnormality. Hepatobiliary: No focal liver abnormality is seen. No gallstones, gallbladder wall thickening, or biliary dilatation. Pancreas: Unremarkable. No pancreatic ductal dilatation or surrounding inflammatory changes. Spleen: Normal in size without focal abnormality. Adrenals/Urinary Tract: Adrenal glands are unremarkable. Kidneys are normal, without renal calculi, focal lesion, or hydronephrosis. Mild wall thickening of the urinary bladder. Stomach/Bowel:  Stomach is within normal limits. Appendix appears normal. No evidence of bowel wall thickening, distention, or inflammatory changes. Vascular/Lymphatic: No significant vascular findings are present. No enlarged abdominal or pelvic lymph nodes. Reproductive: Uterus and bilateral adnexa are unremarkable. IUD is well seated within the uterine fundus. Other: No abdominal wall hernia or abnormality. No abdominopelvic ascites. Musculoskeletal: No acute or significant osseous findings. IMPRESSION: 1. Mild wall thickening of the urinary bladder may represent cystitis. 2. IUD well-seated in uterine fundus. 3. Otherwise unremarkable CT of abdomen and pelvis. Electronically Signed   By: Mitzi Hansen M.D.   On: 08/23/2017 03:12     Assessment / Plan:   Nadyne Picha is a 22 y.o. with intractable NV and concern for relationship to recently placed IUD  1. Patient was on combined OCPs (continuous) prior to IUD placement.  It is wholly unlikely that her NV is related to the hormone within, however I can't say impossible.   2. What may be more likely is an inflammatory and prostaglandin response to having the device in place.  Although she doesn't complain of severe cramping or pain, perhaps she is having a systemic response to the foreign body.  She has nausea typically with and prior to her menses, so perhaps there is a relationship there.  Admittedly a stretch, but so far we have no other plausible etiology. 3. I offered her to remove the IUD just to see how her body responds.  My guess is if her symptoms are due to the presence of the IUD then improvement upon removal should be fairly quick.  She understands that if she doesn't improve, that the IUD can be replaced at a later time.  She also understands that removal may be coincidental with a natural course of improvement.   4. IUD removed, see procedure note.  Sent for culture. 5. Will restart OCPs tomorrow, knowing that this may confound the picture,  but would be better than starting a painful period on top of her already nauseated state. We only have 0.4/35 instead of 1/35.  Will order her normal tablets from her pharmacy - mom can pick up tomorrow and administer.  6. Ordered toradol since she vomited several times while I was in the room.  Q6h.  Also ordered reglan since she continued to vomit after admin of phenergan.   7. IF she doesn't improve quickly after removal of IUD, I would recommend a  head CT to look for causative lesion.     Will follow along with you.    Thank you for the opportunity to be involved with this patient's care.  ----- Ranae Plumber, MD Attending Obstetrician and Gynecologist [facility], Department of OB/GYN [facility]   75 minutes were spent caring for this patient, >50% face-to-face.

## 2017-08-24 NOTE — Progress Notes (Signed)
Initial Nutrition Assessment  DOCUMENTATION CODES:   Not applicable  INTERVENTION:   Ensure Enlive po BID, each supplement provides 350 kcal and 20 grams of protein  Recommend check Mg and P labs   NUTRITION DIAGNOSIS:   Inadequate oral intake related to acute illness as evidenced by meal completion < 25%.  GOAL:   Patient will meet greater than or equal to 90% of their needs  MONITOR:   PO intake, Supplement acceptance, Weight trends, Labs, I & O's  REASON FOR ASSESSMENT:   Malnutrition Screening Tool    ASSESSMENT:   22 year old female patient with history of pots syndrome admitted with Intractable nausea and vomiting.   Met with pt and pt's mother in room today. Pt reports poor appetite and oral intake for 3 days pta. 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 Pt's sister also has POTS and has been diagnosed with gastroparesis s/p gastric emptying study. Pt reports "slow digestion" and reports it takes her a long time to feel hungry after eating. Pt also reports intermittent nausea. Pt reports a UBW ~120lbs; pt reports a 8lb(7%) wt loss over the past week. Pt ate some cheerios  for breakfast this morning. Pt ordered white toast and applesuace for lunch. RD discussed with pt the importance of adequate protein intake to preserve lean muscle. Pt would like to have chocolate Ensure. Recommend check Mg and P labs as pt is at moderate refeeding risk. Pt with low potassium today; recommend monitor and supplement as needed per MD discretion.   Medications reviewed and include: reglan, NaCl @125ml /hr, ceftriaxone  Labs reviewed: K 3.4(L), BUN <5(L)  Nutrition-Focused physical exam completed. Findings are no fat depletion, mild muscle depletions in clavicles, and no edema.   Diet Order:  Diet regular Room service appropriate? Yes; Fluid consistency: Thin  EDUCATION NEEDS:   Education needs have been addressed  Skin:  Reviewed RN Assessment  Last BM:   1/6  Height:   Ht Readings from Last 1 Encounters:  08/22/17 5' 4"  (1.626 m) (45 %, Z= -0.12)*   * Growth percentiles are based on CDC (Girls, 2-20 Years) data.    Weight:   Wt Readings from Last 1 Encounters:  08/22/17 112 lb (50.8 kg) (18 %, Z= -0.90)*   * Growth percentiles are based on CDC (Girls, 2-20 Years) data.    Ideal Body Weight:  54.5 kg  BMI:  Body mass index is 19.22 kg/m.  Estimated Nutritional Needs:   Kcal:  1700-2000kcal/day  Protein:  61-71g/day   Fluid:  >1.7L/day   Koleen Distance MS, RD, LDN Pager #[phone] After Hours Pager: [phone]

## 2017-08-24 NOTE — Progress Notes (Signed)
Pt refused toradol and reglan, states she feels a little better and not nauseated at the moment. Ate a little bit of cheerios.

## 2017-08-25 NOTE — Progress Notes (Signed)
1        [facility] [place] [facility]   PATIENT NAME: Tina Barr    MR#:  [id number]  DATE OF BIRTH:  01/07/1996  SUBJECTIVE:  CHIEF COMPLAINT:   Chief Complaint  Patient presents with  . Emesis  improving n/v. She says she vomited once overnight but willing to try popsicle REVIEW OF SYSTEMS:  Review of Systems  Constitutional: Negative for chills, fever and weight loss.  HENT: Negative for nosebleeds and sore throat.   Eyes: Negative for blurred vision.  Respiratory: Negative for cough, shortness of breath and wheezing.   Cardiovascular: Negative for chest pain, orthopnea, leg swelling and PND.  Gastrointestinal: Positive for nausea and vomiting. Negative for abdominal pain, constipation, diarrhea and heartburn.  Genitourinary: Negative for dysuria and urgency.  Musculoskeletal: Negative for back pain.  Skin: Negative for rash.  Neurological: Negative for dizziness, speech change, focal weakness and headaches.  Endo/Heme/Allergies: Does not bruise/bleed easily.  Psychiatric/Behavioral: Negative for depression.   DRUG ALLERGIES:  No Known Allergies VITALS:  Blood pressure 135/90, pulse (!) 102, temperature 98.1 F (36.7 C), resp. rate 18, height 5\' 4"  (1.626 m), weight 49.6 kg (109 lb 4.8 oz), SpO2 99 %. PHYSICAL EXAMINATION:  Physical Exam  Constitutional: She is oriented to person, place, and time and well-developed, well-nourished, and in no distress.  HENT:  Head: Normocephalic and atraumatic.  Eyes: Conjunctivae and EOM are normal. Pupils are equal, round, and reactive to light.  Neck: Normal range of motion. Neck supple. No tracheal deviation present. No thyromegaly present.  Cardiovascular: Normal rate, regular rhythm and normal heart sounds.  Pulmonary/Chest: Effort normal and breath sounds normal. No respiratory distress. She has no wheezes. She exhibits no tenderness.  Abdominal: Soft. Bowel sounds are normal. She exhibits no  distension. There is no tenderness.  Musculoskeletal: Normal range of motion.  Neurological: She is alert and oriented to person, place, and time. No cranial nerve deficit.  Skin: Skin is warm and dry. No rash noted.  Psychiatric: Mood and affect normal.   LABORATORY PANEL:  Female CBC Recent Labs  Lab 08/24/17 0419  WBC 7.3  HGB 12.7  HCT 37.7  PLT 258   ------------------------------------------------------------------------------------------------------------------ Chemistries  Recent Labs  Lab 08/22/17 2326 08/24/17 0419  NA 136 136  K 3.6 3.4*  CL 100* 105  CO2 20* 20*  GLUCOSE 112* 78  BUN 11 <5*  CREATININE 0.87 0.64  CALCIUM 9.8 8.9  MG  --  1.7  AST 27  --   ALT 17  --   ALKPHOS 75  --   BILITOT 1.4*  --    RADIOLOGY:  No results found. ASSESSMENT AND PLAN:  22 year old female patient with history of pots syndrome admitted with Intractable nausea and vomiting.  1.  Intractable nausea vomiting: unknown etio, zofran, reglan & phenergan PRN - CT abd-Pelvis neg for acute patho - UDS neg - amylase, lipase normal -Appreciate GYN assistance, IUD removed on 1/8, patient was feeling much better y'day morning but again worse in afternoon per her mother.  - will consider CT head and GI consultation if she gets clinically worse  - start PO - patient agreeable to try popsicle 2.  Dehydration: continue IVFs till she can tolerate PO nutrition 3.  Acute cystitis: IV rocephin 4.  Pots syndrome: HR under control     All the records are reviewed and case discussed with Care Management/Social Worker. Management plans discussed with the patient, nursing and they  are in agreement.  CODE STATUS: Full Code  TOTAL TIME TAKING CARE OF THIS PATIENT: 35 minutes.   More than 50% of the time was spent in counseling/coordination of care: YES  POSSIBLE D/C IN 1-2 DAYS, DEPENDING ON CLINICAL CONDITION.   Delfino LovettVipul Carollynn Pennywell M.D on 08/25/2017 at 6:30 AM  Between 7am to 6pm - Pager -  [phone]  After 6pm go to [email or web address] - Social research officer, governmentpassword [email or web address]  [facility] [place] [facility]  Office  [phone]  CC: Primary care physician; System, Pcp Not In  Note: This dictation was prepared with Dragon dictation along with smaller phrase technology. Any transcriptional errors that result from this process are unintentional.

## 2017-08-25 NOTE — Progress Notes (Signed)
Obstetric and Gynecology  POD/HD #3  Subjective   Still having nausea, not as much vomiting.  Felt the best the morning after her IUD was removed. She was on toradol over the last 24hrs.  This was discontinued but not replaced with anything.  She remains on Reglan, which is her only anti-emetic at this time.      Objective  Objective:   Vitals:   08/24/17 1158 08/24/17 1951 08/24/17 2015 08/25/17 0539  BP: (!) 137/93  135/89 135/90  Pulse: (!) 108  (!) 110 (!) 102  Resp: 14  18 18   Temp: 98.2 F (36.8 C)  98.4 F (36.9 C) 98.1 F (36.7 C)  TempSrc:   Oral   SpO2: 99%  100% 99%  Weight:  49.6 kg (109 lb 4.8 oz)    Height:        General: NAD Cardiovascular: RRR, no murmurs Pulmonary: CTAB, normal respiratory effort Abdomen: Benign. Non-tender, +BS, no guarding.  Extremities: No erythema or cords, no calf tenderness, with normal peripheral pulses.  Labs:  Cultures: Results for orders placed or performed during the hospital encounter of 08/23/17  Urine Culture     Status: None   Collection Time: 08/23/17  2:34 AM  Result Value Ref Range Status   Specimen Description   Final    URINE, RANDOM Performed at [facility], [address] [place], [place] [address]    Special Requests   Final    Normal Performed at [facility], [address] [place], [place] [address]    Culture   Final    NO GROWTH Performed at [facility], [address] [place], [place] [address]    Report Status 08/24/2017 FINAL  Final  Anaerobic culture     Status: None (Preliminary result)   Collection Time: 08/24/17 12:15 AM  Result Value Ref Range Status   Specimen Description   Final    VAGINA IUD Performed at [facility], [address] [place], [place] [address]    Special Requests   Final    Normal Performed at [facility], [address] [place], [place] [address]    Gram Stain   Final    NO WBC SEEN RARE Romie MinusGRAM NEGATIVE RODS RARE  GRAM POSITIVE RODS    Culture   Final    CULTURE REINCUBATED FOR BETTER GROWTH Performed at [facility], [address] [place], [place] [address]    Report Status PENDING  Incomplete    Assessment   22 [facility]: 3 intractable NV  Plan   1. Consider PO anti-inflammatory. 2. Will f/u culture of IUD.   3. Continue OCP, continuous dosing. 4. F/u with me PRN.  [place] the [place].  ----- Ranae Plumberhelsea Paisyn Guercio, MD Attending Obstetrician and Gynecologist [facility], Department of OB/GYN [facility]

## 2017-08-26 MED ORDER — ONDANSETRON HCL 4 MG PO TABS: 4.0000 mg | ORAL_TABLET | Freq: Four times a day (QID) | ORAL | 0 refills | Status: DC | PRN

## 2017-08-26 MED ORDER — ONDANSETRON HCL 4 MG PO TABS: 4.0000 mg | ORAL_TABLET | Freq: Four times a day (QID) | ORAL | 0 refills | Status: AC | PRN

## 2017-08-26 MED ORDER — IBUPROFEN 400 MG PO TABS: 400.0000 mg | ORAL_TABLET | Freq: Four times a day (QID) | ORAL | 0 refills | Status: DC | PRN

## 2017-08-26 MED ORDER — IBUPROFEN 400 MG PO TABS: 400.0000 mg | ORAL_TABLET | Freq: Four times a day (QID) | ORAL | 0 refills | Status: AC | PRN

## 2017-08-26 MED ORDER — NORETHIN-ETH ESTRADIOL-FE 0.4-35 MG-MCG PO CHEW: 1.0000 | CHEWABLE_TABLET | Freq: Every day | ORAL | 11 refills | Status: DC

## 2017-08-26 NOTE — Discharge Instructions (Signed)
Nausea, Adult Feeling sick to your stomach (nausea) means that your stomach is upset or you feel like you have to throw up (vomit). Feeling sick to your stomach is usually not serious, but it may be an early sign of a more serious medical problem. As you feel sicker to your stomach, it can lead to throwing up (vomiting). If you throw up, or if you are not able to drink enough fluids, there is a risk of dehydration. Dehydration can make you feel tired and thirsty, have a dry mouth, and pee (urinate) less often. Older adults and people who have other diseases or a weak defense (immune) system have a higher risk of dehydration. The main goal of treating this condition is to:  Limit how often you feel sick to your stomach.  Prevent throwing up and dehydration.  Follow these instructions at home: Follow instructions from your doctor about how to care for yourself at home. Eating and drinking Follow these recommendations as told by your doctor:  Take an oral rehydration solution (ORS). This is a drink that is sold at pharmacies and stores.  Drink clear fluids in small amounts as you are able, such as: ? Water. ? Ice chips. ? Fruit juice that has water added (diluted fruit juice). ? Low-calorie sports drinks.  Eat bland, easy to digest foods in small amounts as you are able, such as: ? Bananas. ? Applesauce. ? Rice. ? Lean meats. ? Toast. ? Crackers.  Avoid drinking fluids that contain a lot of sugar or caffeine.  Avoid alcohol.  Avoid spicy or fatty foods.  General instructions  Drink enough fluid to keep your pee (urine) clear or pale yellow.  Wash your hands often. If you cannot use soap and water, use hand sanitizer.  Make sure that all people in your household wash their hands well and often.  Rest at home while you get better.  Take over-the-counter and prescription medicines only as told by your doctor.  Breathe slowly and deeply when you feel sick to your  stomach.  Watch your condition for any changes.  Keep all follow-up visits as told by your doctor. This is important. Contact a doctor if:  You have a headache.  You have new symptoms.  You feel sicker to your stomach.  You have a fever.  You feel light-headed or dizzy.  You throw up.  You are not able to keep fluids down. Get help right away if:  You have pain in your chest, neck, arm, or jaw.  You feel very weak or you pass out (faint).  You have throw up that is bright red or looks like coffee grounds.  You have bloody or black poop (stools), or poop that looks like tar.  You have a very bad headache, a stiff neck, or both.  You have very bad pain, cramping, or bloating in your belly.  You have a rash.  You have trouble breathing or you are breathing very quickly.  Your heart is beating very quickly.  Your skin feels cold and clammy.  You feel confused.  You have pain while peeing.  You have signs of dehydration, such as: ? Dark pee, or very little or no pee. ? Cracked lips. ? Dry mouth. ? Sunken eyes. ? Sleepiness. ? Weakness. These symptoms may be an emergency. Do not wait to see if the symptoms will go away. Get medical help right away. Call your local emergency services (911 in the [place] Do not drive yourself to   the hospital. This information is not intended to replace advice given to you by your health care provider. Make sure you discuss any questions you have with your health care provider. Document Released: 07/22/2011 Document Revised: 01/08/2016 Document Reviewed: 04/08/2015 Elsevier Interactive Patient Education  2018 [employer]  

## 2017-08-26 NOTE — Progress Notes (Signed)
Discharge order received. Patient is alert and oriented. Vital signs stable . No signs of acute distress. Discharge instructions given. Patient verbalized understanding. No other issues noted at this time.   

## 2017-08-28 ENCOUNTER — Emergency Department
Admission: EM | Admit: 2017-08-28 | Discharge: 2017-08-28 | Disposition: A | Discharge status: Home / Self Care | Payer: No Typology Code available for payment source | Attending: Emergency Medicine | Admitting: Emergency Medicine

## 2017-08-28 ENCOUNTER — Emergency Department: Payer: No Typology Code available for payment source

## 2017-08-28 DIAGNOSIS — I951 Orthostatic hypotension: Secondary | ICD-10-CM | POA: Insufficient documentation

## 2017-08-28 DIAGNOSIS — I498 Other specified cardiac arrhythmias: Secondary | ICD-10-CM

## 2017-08-28 DIAGNOSIS — G90A Postural orthostatic tachycardia syndrome (POTS): Secondary | ICD-10-CM

## 2017-08-28 DIAGNOSIS — R000 Tachycardia, unspecified: Secondary | ICD-10-CM | POA: Insufficient documentation

## 2017-08-28 DIAGNOSIS — R112 Nausea with vomiting, unspecified: Secondary | ICD-10-CM | POA: Insufficient documentation

## 2017-08-28 LAB — URINALYSIS REFLEX TO MICROSCOPIC EXAM - REFLEX TO CULTURE
Bilirubin, UA: NEGATIVE
Glucose, UA: NEGATIVE
Ketones UA: >=80 — AB
Leukocyte Esterase, UA: NEGATIVE
Nitrite, UA: NEGATIVE
Protein, UR: 100 — AB
Specific Gravity UA: 1.021 (ref 1.001–1.035)
Urine pH: 6 (ref 5.0–8.0)
Urobilinogen, UA: 2 mg/dL

## 2017-08-28 LAB — COMPREHENSIVE METABOLIC PANEL
ALT: 12 U/L (ref 0–55)
AST (SGOT): 21 U/L (ref 5–34)
Albumin/Globulin Ratio: 1.4 (ref 0.9–2.2)
Albumin: 4.8 g/dL (ref 3.5–5.0)
Alkaline Phosphatase: 87 U/L (ref 50–130)
BUN: 14 mg/dL (ref 7.0–19.0)
Bilirubin, Total: 0.7 mg/dL (ref 0.2–1.2)
CO2: 21 mEq/L — ABNORMAL LOW (ref 22–29)
Calcium: 10.2 mg/dL (ref 8.5–10.5)
Chloride: 95 mEq/L — ABNORMAL LOW (ref 100–111)
Creatinine: 0.9 mg/dL (ref 0.6–1.0)
Globulin: 3.5 g/dL (ref 2.0–3.6)
Glucose: 97 mg/dL (ref 70–100)
Potassium: 4.3 mEq/L (ref 3.5–5.1)
Protein, Total: 8.3 g/dL (ref 6.0–8.3)
Sodium: 134 mEq/L — ABNORMAL LOW (ref 136–145)

## 2017-08-28 LAB — I-STAT CG4 VENOUS CARTRIDGE
Lactic Acid I-Stat: 0.8 mmol/L (ref 0.2–2.0)
i-STAT Base Excess Venous: -1 mEq/L
i-STAT FIO2: 21
i-STAT HCO3 Bicarbonate Venous: 25.7 mEq/L
i-STAT O2 Saturation Venous: 19 %
i-STAT Patient Temperature: 98.7
i-STAT Total CO2 Venous: 27 mEq/L
i-STAT pCO2 Venous: 48.3
i-STAT pH Venous: 7.334
i-STAT pO2 Venous: 16

## 2017-08-28 LAB — POCT PREGNANCY TEST, URINE HCG: POCT Pregnancy HCG Test, UR: NEGATIVE

## 2017-08-28 LAB — CBC AND DIFFERENTIAL
Absolute NRBC: 0 10*3/uL
Basophils Absolute Automated: 0.07 10*3/uL (ref 0.00–0.20)
Basophils Automated: 0.5 %
Eosinophils Absolute Automated: 0.05 10*3/uL (ref 0.00–0.70)
Eosinophils Automated: 0.4 %
Hematocrit: 47.5 % — ABNORMAL HIGH (ref 37.0–47.0)
Hgb: 16.2 g/dL — ABNORMAL HIGH (ref 12.0–16.0)
Immature Granulocytes Absolute: 0.05 10*3/uL
Immature Granulocytes: 0.4 %
Lymphocytes Absolute Automated: 1.36 10*3/uL (ref 0.50–4.40)
Lymphocytes Automated: 10 %
MCH: 30.1 pg (ref 28.0–32.0)
MCHC: 34.1 g/dL (ref 32.0–36.0)
MCV: 88.1 fL (ref 80.0–100.0)
MPV: 10 fL (ref 9.4–12.3)
Monocytes Absolute Automated: 0.94 10*3/uL (ref 0.00–1.20)
Monocytes: 6.9 %
Neutrophils Absolute: 11.07 10*3/uL — ABNORMAL HIGH (ref 1.80–8.10)
Neutrophils: 81.8 %
Nucleated RBC: 0 /100 WBC (ref 0.0–1.0)
Platelets: 283 10*3/uL (ref 140–400)
RBC: 5.39 10*6/uL (ref 4.20–5.40)
RDW: 13 % (ref 12–15)
WBC: 13.54 10*3/uL — ABNORMAL HIGH (ref 3.50–10.80)

## 2017-08-28 LAB — LIPASE: Lipase: 18 U/L (ref 8–78)

## 2017-08-28 MED ORDER — FAMOTIDINE 10 MG/ML IV SOLN (WRAP)
20.0000 mg | Freq: Once | INTRAVENOUS | Status: AC
Start: 2017-08-28 — End: 2017-08-28
  Administered 2017-08-28: 20 mg via INTRAVENOUS
  Filled 2017-08-28: qty 2

## 2017-08-28 MED ORDER — ONDANSETRON HCL 4 MG/2ML IJ SOLN
4.0000 mg | Freq: Once | INTRAMUSCULAR | Status: AC
Start: 2017-08-28 — End: 2017-08-28
  Administered 2017-08-28: 4 mg via INTRAVENOUS
  Filled 2017-08-28: qty 2

## 2017-08-28 MED ORDER — METOCLOPRAMIDE HCL 5 MG/ML IJ SOLN
10.0000 mg | Freq: Once | INTRAMUSCULAR | Status: AC
Start: 2017-08-28 — End: 2017-08-28
  Administered 2017-08-28: 10 mg via INTRAVENOUS
  Filled 2017-08-28: qty 2

## 2017-08-28 MED ORDER — SODIUM CHLORIDE 0.9 % IV BOLUS
1000.0000 mL | Freq: Once | INTRAVENOUS | Status: AC
Start: 2017-08-28 — End: 2017-08-28
  Administered 2017-08-28: 1000 mL via INTRAVENOUS

## 2017-08-28 MED ORDER — ONDANSETRON 4 MG PO TBDP
4.0000 mg | ORAL_TABLET | Freq: Four times a day (QID) | ORAL | 0 refills | Status: AC | PRN
Start: 2017-08-28 — End: 2017-09-04

## 2017-08-28 MED ORDER — METOCLOPRAMIDE HCL 10 MG PO TABS
10.0000 mg | ORAL_TABLET | Freq: Three times a day (TID) | ORAL | 0 refills | Status: DC | PRN
Start: 2017-08-28 — End: 2017-10-20

## 2017-08-28 NOTE — Discharge Instructions (Signed)
Dear [name]:    Thank you for choosing the [facility] Emergency Department, the premier emergency department in the [place] area.  I hope your visit today was EXCELLENT.    Specific instructions for your visit today:    Return to ER for abdominal pain, fever, vomiting despite zofran/reglan, or for other concerning symptoms.    Follow-up with Dr. Lawana Pai tomorrow as scheduled.  See GI specialist recommended above or as recommended by Dr. Lawana Pai.    About your prescribed medications:  Take Zofran (ondansetron) as needed for nausea.  Reglan as needed for nausea not resolved by Zofran.       If you do not continue to improve or your condition worsens, please contact your doctor or return immediately to the Emergency Department.    Sincerely,  Hady Niemczyk, Ed Blalock, MD  Attending Emergency Physician  [facility] Emergency Department    ONSITE PHARMACY  Our full service onsite pharmacy is located in the ER waiting room.  Open 7 days a week from 9 am to 11 pm.  We accept all major insurances and prices are competitive with major retailers.  Ask your provider to print your prescriptions down to the pharmacy to speed you on your way home.    OBTAINING A PRIMARY CARE APPOINTMENT    Primary care physicians (PCPs, also known as primary care doctors) are either internists or family medicine doctors. Both types of PCPs focus on health promotion, disease prevention, patient education and counseling, and treatment of acute and chronic medical conditions.    Call for an appointment with a primary care doctor.  Ask to see who is taking new patients.     [facility]  telephone:  [phone]  [email or web address]    DOCTOR REFERRALS  Call [phone] (available 24 hours a day, 7 days a week) if you need any further referrals and we can help you find a primary care doctor or specialist.  Also, available online at:  [email or web address]    YOUR CONTACT INFORMATION  Before leaving please  check with registration to make sure we have an up-to-date contact number.  You can call registration at [phone] to update your information.  For questions about your hospital bill, please call [phone].  For questions about your Emergency Dept Physician bill please call [phone].      FREE HEALTH SERVICES  If you need help with health or social services, please call 2-1-1 for a free referral to resources in your area.  2-1-1 is a free service connecting people with information on health insurance, free clinics, pregnancy, mental health, dental care, food assistance, housing, and substance abuse counseling.  Also, available online at:  http://www.[facility].org    MEDICAL RECORDS AND TESTS  Certain laboratory test results do not come back the same day, for example urine cultures.   We will contact you if other important findings are noted.  Radiology films are often reviewed again to ensure accuracy.  If there is any discrepancy, we will notify you.      Please call [phone] to pick up a complimentary CD of any radiology studies performed.  If you or your doctor would like to request a copy of your medical records, please call [phone].      ORTHOPEDIC INJURY   Please know that significant injuries can exist even when an initial x-ray is read as normal or negative.  This can occur because some fractures (broken bones) are not initially visible on x-rays.  For this  reason, close outpatient follow-up with your primary care doctor or bone specialist (orthopedist) is required.    MEDICATIONS AND FOLLOWUP  Please be aware that some prescription medications can cause drowsiness.  Use caution when driving or operating machinery.    The examination and treatment you have received in our Emergency Department is provided on an emergency basis, and is not intended to be a substitute for your primary care physician.  It is important that your doctor checks you again and that you report any new or  remaining problems at that time.      24 HOUR PHARMACIES  The nearest 24 hour pharmacy is:    [facility]  [address]  [place], [place] [address]  [phone]      ASSISTANCE WITH INSURANCE    Affordable Care Act  [facility])  Call to start or finish an application, compare plans, enroll or ask a question.  [phone]  TTY: [phone]  Web:  Healthcare.gov    Help Enrolling in [facility]  [employer] [place]  361-095-4699 (TOLL-FREE)  814-730-7105 (TTY)  Web:  Http://www.coverva.org    Local Help Enrolling in the [facility]  [place] [facility]  [phone] (MAIN)  Email:  health-help@nvfs .org  Web:  BlackjackMyths.is  Address:  [address] [place], [place] [address]    SEDATING MEDICATIONS  Sedating medications include strong pain medications (e.g. narcotics), muscle relaxers, benzodiazepines (used for anxiety and as muscle relaxers), Benadryl/diphenhydramine and other antihistamines for allergic reactions/itching, and other medications.  If you are unsure if you have received a sedating medication, please ask your physician or nurse.  If you received a sedating medication: DO NOT drive a car. DO NOT operate machinery. DO NOT perform jobs where you need to be alert.  DO NOT drink alcoholic beverages while taking this medicine.     If you get dizzy, sit or lie down at the first signs. Be careful going up and down stairs.  Be extra careful to prevent falls.     Never give this medicine to others.     Keep this medicine out of reach of children.     Do not take or save old medicines. Throw them away when outdated.     Keep all medicines in a cool, dry place. DO NOT keep them in your bathroom medicine cabinet or in a cabinet above the stove.    MEDICATION REFILLS  Please be aware that we cannot refill any prescriptions through the ER. If you need further treatment from what is provided at your ER visit, please follow up with your primary care doctor or your pain management  specialist.    FREESTANDING EMERGENCY DEPARTMENTS OF [facility]  Did you know [facility] has two freestanding ERs located just a few miles away?  [place] [facility] [place] and [facility] [facility] [place] have short wait times, easy free parking directly in front of the building and top patient satisfaction scores - and the same Board Certified Emergency Medicine doctors as [facility].              Bridget [name]  [id number]  [id number]  [phone]  08/28/2017    Discharge Instructions    As always, you are the most important factor in your recovery.  Please follow these instructions carefully.  If you have problems that we have not discussed, CALL OR VISIT YOUR DOCTOR RIGHT AWAY.     If you can't reach your  doctor, return to the emergency department.    I Ether Griffins understand the written and discussed instructions.  My questions have been answered.  I acknowledge receipt of these instructions.     Patient or responsible person:         Patient's Signature               Physician or Nurse

## 2017-08-28 NOTE — ED Provider Notes (Signed)
[facility] EMERGENCY DEPARTMENT H&P      Visit date: 08/28/2017      CLINICAL SUMMARY           Diagnosis:    .     Final diagnoses:   Non-intractable vomiting with nausea, unspecified vomiting type   POTS (postural orthostatic tachycardia syndrome)         MDM Notes:      Patient with a long history of intermittent vomiting here with approximate 2 weeks of worsening vomiting and poor oral intake following recent discharge from an admission for the management of this at a hospital in [place].  Patient's symptoms did improve significantly here with fluids, Zofran and Reglan.  She is tolerating oral intake including calorie-containing fluids and crackers and firmly does not want to be admitted to the hospital.  The patient did have some mild right lower quadrant discomfort on some exams and an ultrasound was done to evaluate the appendix, but this was nondiagnostic.  She did have a CT scan on Monday that showed a normal-appearing appendix.  Suspicion for appendicitis is low and risks of further CT scan outweigh benefits at this time, though I discussed Return precautions with the patient and her mother here the patient has a follow-up appointment with her primary doctor tomorrow and I recommended gastroenterologist for follow-up as well.         Disposition:         Discharge         Discharge Prescriptions     Medication Sig Dispense Auth. Provider    ondansetron (ZOFRAN ODT) 4 MG disintegrating tablet Take 1 tablet (4 mg total) by mouth every 6 (six) hours as needed for Nausea.for up to 7 days 20 tablet Avamarie Crossley, Ed Blalock, MD    metoclopramide (REGLAN) 10 MG tablet Take 1 tablet (10 mg total) by mouth 3 (three) times daily as needed (nausea). 15 tablet Feliciana Narayan, Ed Blalock, MD                      CLINICAL INFORMATION        HPI:      Chief Complaint: Emesis and Extremity Weakness  .    Bridget [name] is a 22 y.o. female with h/o of POTS and Ehlers-Danlos syndrome, who  presents with persistent intractable nausea and vomiting for 2 weeks associated with abdominal pain and heartburn.  No diarrhea, dysuria, or vaginal discharge.  Last dose of Zofran 4mg  at 10:15am today.     Had IUD placed on December 19 to help with her periods, which often caused her severe pain and intractable vomiting.  After IUD placement, reports nausea, vomiting, and cramping for 1 week until they resolved.  Nausea and vomiting returned on January 6 and she went to her school nurse, who diagnosed her with UTI.    Went to ER on January 7 for persistent vomiting and was admitted to hospital [facility] [place]) for 5 days.  Was on IV antibiotics for 1-2 days and finished oral antibiotics on January 10.  Was taking Zofran with no significant relief.  CT of abdomen was negative there.  One day in hospital she took Toradol and Reglan with significant relief of nausea and felt much better, but was still unable to tolerate PO.  However, did not take those two medications again and nausea and vomiting returned.    She had her IUD removed at hospital, but that did not relieve symptoms.  Her  period started the day after it was removed. Patient was discharged on Friday from hospital and came back here, where she lives.     Pediatrician: Dr. Lind [name]  Cardiologist: Dr. Lequita [name]  Has no GI doctor.    History obtained from: patient, family          ROS:      Positive and negative ROS elements as per HPI.  All other systems reviewed and negative.      Physical Exam:      Pulse 81  BP 123/84  Resp 18  SpO2 92 %  Temp 98.5 F (36.9 C)    Constitutional: vital signs reviewed, thin, appears uncomfortable  Eyes: no conjunctival injection  Head: normocephalic, atraumatic  ENT: somewhat dry mucous membranes, no injection  Cardiovascular: normal rate, no murmurs  Respiratory: no respiratory distress, lungs clear  GI: soft, no focal TTP  GU: No CMT, No adnexal masses or tenderness  Musculoskeletal:     Neck: normal  movement, no swelling     Back: No CVA TTP     Upper extremities: normal movement     Lower extremities: no edema, no calf tenderness  Integ/Skin: warm, dry  Neuro: Alert, speech normal  Psych: flat affect in the setting of discomfort               PAST HISTORY        Primary Care Provider: Lind Covert, MD        PMH/PSH:    .     Past Medical History:   Diagnosis Date   . Abdominal pain    . Cellulitis and abscess of foot    . Joint pain    . POTS (postural orthostatic tachycardia syndrome)    . Sinus trouble    . Skin rash        She has a past surgical history that includes Tonsillectomy.      Social/Family History:      She reports that she has never smoked. She has never used smokeless tobacco. She reports that she does not drink alcohol or use drugs.    Family History   Problem Relation Age of Onset   . Cancer Mother          Listed Medications on Arrival:    .     Home Medications     Med List Status:  In Progress Set By: Marlaine Hind, RN at 08/28/2017 11:32 AM                JOLESSA 0.15-0.03 MG per tablet                                                                                              Allergies: She has No Known Allergies.            VISIT INFORMATION        Clinical Course in the ED:          ED Course as of Aug 28 1633   Sun Aug 28, 2017   1208 Discussed with her pediatrician,  who recommends admission to hospitalist service.   [SR]      ED Course User Index  [SR] Rodeo, Scott     Patient reassessed several times.  She did improve here and was able to drink liquid, including Gatorade and eat a few crackers.  Improvement seemed most related to Reglan.    I was able to review the read of the patient's CT scan on Monday anterior everywhere, and it shows a visualized normal appendix.  Given some intermittent right lower quadrant tenderness (with no pain) here and risk-benefit profile, I did do a right lower quadrant ultrasound, but this was nondiagnostic.    Discussing with patient and family  after the study, options for treatment including CT scan, admission discussed along with risks and benefits.  Patient firmly does not want to be admitted to the hospital and given that she has demonstrated ability to keep herself hydrated here and is tolerating more calories than she had been previously, this is acceptable.  I agree that the risks of CT scan outweighs benefits at this time, but the patient has a follow-up tomorrow ordering with her pediatrician and we discussed appendicitis return precautions and need for his reevaluation of this tomorrow.      Medications Given in the ED:    .     ED Medication Orders     Start Ordered     Status Ordering Provider    08/28/17 1156 08/28/17 1155  sodium chloride 0.9 % bolus 1,000 mL  Once     Route: Intravenous  Ordered Dose: 1,000 mL     Last MAR action:  Stopped Tam Savoia LEWIS    08/28/17 1156 08/28/17 1155  ondansetron (ZOFRAN) injection 4 mg  Once     Route: Intravenous  Ordered Dose: 4 mg     Last MAR action:  Given Suzan Slick LEWIS    08/28/17 1156 08/28/17 1155  metoclopramide (REGLAN) injection 10 mg  Once     Route: Intravenous  Ordered Dose: 10 mg     Last MAR action:  Given Suzan Slick LEWIS    08/28/17 1156 08/28/17 1155  famotidine (PEPCID) injection 20 mg  Once     Route: Intravenous  Ordered Dose: 20 mg     Last MAR action:  Given Keny Donald LEWIS            Procedures:      Procedures      Interpretations:      O2 sat-           saturation: 92 %; Oxygen use: room air; Interpretation: Normal                   RESULTS        Lab Results:      Results     Procedure Component Value Units Date/Time    UA Reflex to Micro - Reflex to Culture [[id number]]  (Abnormal) Collected:  08/28/17 1210     Updated:  08/28/17 1308     Urine Type Other     Color, UA Yellow     Clarity, UA Hazy     Specific Gravity UA 1.021     Urine pH 6.0     Leukocyte Esterase, UA Negative     Nitrite, UA Negative     Protein, UR 100 (A)     Glucose, UA Negative      Ketones UA >=80 (A)     Urobilinogen, UA 2.0 mg/dL  Bilirubin, UA Negative     Blood, UA Small (A)     RBC, UA 3 - 5 /hpf      WBC, UA 0 - 5 /hpf      Squamous Epithelial Cells, Urine 0 - 5 /hpf      Urine Mucus Present    Narrative:       Recent UTI, vomiting  Replace urinary catheter prior to obtaining the urine culture  if it has been in place for greater than or equal to 14  days:->No  Indications for U/A Reflex to Micro - Reflex to  Culture:->Other (please specify in Comments)    Comprehensive metabolic panel [[id number]]  (Abnormal) Collected:  08/28/17 1210    Specimen:  Blood Updated:  08/28/17 1248     Glucose 97 mg/dL      BUN 47.8 mg/dL      Creatinine 0.9 mg/dL      Sodium 295 (L) mEq/L      Potassium 4.3 mEq/L      Chloride 95 (L) mEq/L      CO2 21 (L) mEq/L      Calcium 10.2 mg/dL      Protein, Total 8.3 g/dL      Albumin 4.8 g/dL      AST (SGOT) 21 U/L      ALT 12 U/L      Alkaline Phosphatase 87 U/L      Bilirubin, Total 0.7 mg/dL      Globulin 3.5 g/dL      Albumin/Globulin Ratio 1.4    Narrative:       Recent UTI, vomiting  Replace urinary catheter prior to obtaining the urine culture  if it has been in place for greater than or equal to 14  days:->No  Indications for U/A Reflex to Micro - Reflex to  Culture:->Other (please specify in Comments)    Lipase [[id number]] Collected:  08/28/17 1210    Specimen:  Blood Updated:  08/28/17 1248     Lipase 18 U/L     Narrative:       Recent UTI, vomiting  Replace urinary catheter prior to obtaining the urine culture  if it has been in place for greater than or equal to 14  days:->No  Indications for U/A Reflex to Micro - Reflex to  Culture:->Other (please specify in Comments)    CBC with differential [[id number]]  (Abnormal) Collected:  08/28/17 1210    Specimen:  Blood from Blood Updated:  08/28/17 1227     WBC 13.54 (H) x10 3/uL      Hgb 16.2 (H) g/dL      Hematocrit 84.1 (H) %      Platelets 283 x10 3/uL      RBC 5.39 x10 6/uL      MCV 88.1 fL      MCH  30.1 pg      MCHC 34.1 g/dL      RDW 13 %      MPV 10.0 fL      Neutrophils 81.8 %      Lymphocytes Automated 10.0 %      Monocytes 6.9 %      Eosinophils Automated 0.4 %      Basophils Automated 0.5 %      Immature Granulocyte 0.4 %      Nucleated RBC 0.0 /100 WBC      Neutrophils Absolute 11.07 (H) x10 3/uL      Abs Lymph Automated 1.36 x10 3/uL  Abs Mono Automated 0.94 x10 3/uL      Abs Eos Automated 0.05 x10 3/uL      Absolute Baso Automated 0.07 x10 3/uL      Absolute Immature Granulocyte 0.05 x10 3/uL      Absolute NRBC 0.00 x10 3/uL     Narrative:       Recent UTI, vomiting  Replace urinary catheter prior to obtaining the urine culture  if it has been in place for greater than or equal to 14  days:->No  Indications for U/A Reflex to Micro - Reflex to  Culture:->Other (please specify in Comments)    i-Stat CG4 Venous CartrIDge [[id number]] Collected:  08/28/17 1217     Updated:  08/28/17 1220     i-STAT pH Venous 7.334     i-STAT pCO2 Venous 48.3     i-STAT pO2 Venous 16.0     i-STAT HCO3 Bicarbonate Venous 25.7 mEq/L      i-STAT Total CO2 Venous 27.0 mEq/L      i-STAT Base Excess Venous -1.0 mEq/L      i-STAT O2 Saturation Venous 19.0 %      i-STAT Lactic acid 0.8 mmol/L      i-STAT Patient Temperature 98.7 F     i-STAT FIO2 21     i-STAT Allen's Test NA     i-STAT Draw Site Venous    Urine HCG, POC/ Qualitative [[id number]] Collected:  08/28/17 1210    Specimen:  Urine Updated:  08/28/17 1210     POCT QC Pass     POCT Pregnancy HCG Test, UR Negative     Comment: Negative Value is Normal in Healthy Males or Healthy non-pregnant Females              Radiology Results:      US Abdomen Limited Appendix   Final Result      The appendix is not visualized. This does not exclude an acute   appendicitis. Clinical correlation and further evaluation with a CT scan   as warranted.      Stephannie Peters, MD    08/28/2017 3:29 PM                  Scribe Attestation:      I was acting as a Neurosurgeon for Tuere Nwosu, Ed Blalock,  MD on [name],[name]  Treatment Team: Scribe: Roderic Ovens     I am the first provider for this patient and I personally performed the services documented. Treatment Team: Scribe: Rodeo, Lorin Picket is scribing for me on [name],[name]. This note and the patient instructions accurately reflect work and decisions made by me.  Elanah Osmanovic, Ed Blalock, MD          Destry Bezdek, Ed Blalock, MD  08/28/17 [phone]

## 2017-08-29 LAB — ANAEROBIC CULTURE
GRAM STAIN: NONE SEEN
SPECIAL REQUESTS: NORMAL

## 2017-08-29 NOTE — Discharge Summary (Signed)
[facility] - [place] at [facility]   PATIENT NAME: Tina RedderMolly Barr    MR#:  [id number]  DATE OF BIRTH:  1996/07/31  DATE OF ADMISSION:  08/23/2017   ADMITTING PHYSICIAN: Tina AustinPavan Pyreddy, MD  DATE OF DISCHARGE: 08/26/2017 10:34 AM  PRIMARY CARE PHYSICIAN: System, Pcp Not In   ADMISSION DIAGNOSIS:  Lower abdominal pain [R10.30] Acute cystitis without hematuria [N30.00] Intractable vomiting with nausea, unspecified vomiting type [R11.2] DISCHARGE DIAGNOSIS:  Active Problems:   Nausea & vomiting   Nausea and vomiting  SECONDARY DIAGNOSIS:   Past Medical History:  Diagnosis Date  . POTS (postural orthostatic tachycardia syndrome)    HOSPITAL COURSE:  22 year old female patient with history of pots syndrome admitted with Intractable nausea and vomiting.  1.Intractable nausea vomiting: unknown etio, zofran, reglan & phenergan PRN - CT abd-Pelvis neg for acute patho - UDS neg - amylase, lipase normal -Appreciate GYN assistance, IUD removed on 1/8, patient is feeling much better. - she tolerated diet.  2.Dehydration: resolved with hydration. 3.Acute cystitis: treated 4.Pots syndrome: HR under control DISCHARGE CONDITIONS:  stable CONSULTS OBTAINED:   DRUG ALLERGIES:  No Known Allergies DISCHARGE MEDICATIONS:   Allergies as of 08/26/2017   No Known Allergies     Medication List    STOP taking these medications   KYLEENA 19.5 MG Iud Generic drug:  Levonorgestrel   metoprolol succinate 25 MG 24 hr tablet Commonly known as:  TOPROL-XL   nitrofurantoin (macrocrystal-monohydrate) 100 MG capsule Commonly known as:  MACROBID   ondansetron 4 MG disintegrating tablet Commonly known as:  ZOFRAN-ODT     TAKE these medications   ibuprofen 400 MG tablet Commonly known as:  ADVIL,MOTRIN Take 1 tablet (400 mg total) by mouth every 6 (six) hours as needed.   NORTREL 1/35 (21) tablet Generic drug:  norethindrone-ethinyl estradiol 1/35 Take 1  tablet by mouth daily.   ondansetron 4 MG tablet Commonly known as:  ZOFRAN Take 1 tablet (4 mg total) by mouth every 6 (six) hours as needed for nausea. Notes to patient:  Last dose given today at 8am        DISCHARGE INSTRUCTIONS:   DIET:  Regular diet DISCHARGE CONDITION:  Good ACTIVITY:  Activity as tolerated OXYGEN:  Home Oxygen: No.  Oxygen Delivery: room air DISCHARGE LOCATION:  home   If you experience worsening of your admission symptoms, develop shortness of breath, life threatening emergency, suicidal or homicidal thoughts you must seek medical attention immediately by calling 911 or calling your MD immediately  if symptoms less severe.  You Must read complete instructions/literature along with all the possible adverse reactions/side effects for all the Medicines you take and that have been prescribed to you. Take any new Medicines after you have completely understood and accpet all the possible adverse reactions/side effects.   Please note  You were cared for by a hospitalist during your hospital stay. If you have any questions about your discharge medications or the care you received while you were in the hospital after you are discharged, you can call the unit and asked to speak with the hospitalist on call if the hospitalist that took care of you is not available. Once you are discharged, your primary care physician will handle any further medical issues. Please note that NO REFILLS for any discharge medications will be authorized once you are discharged, as it is imperative that you return to your primary care physician (or establish a relationship with a primary care physician if you  do not have one) for your aftercare needs so that they can reassess your need for medications and monitor your lab values.    On the day of Discharge:  VITAL SIGNS:  Blood pressure 128/89, pulse (!) 101, temperature 98.6 F (37 C), temperature source Oral, resp. rate 18, height 5'  4" (1.626 m), weight 49.6 kg (109 lb 4.8 oz), SpO2 99 %. PHYSICAL EXAMINATION:  GENERAL:  22 y.o.-year-old patient lying in the bed with no acute distress.  EYES: Pupils equal, round, reactive to light and accommodation. No scleral icterus. Extraocular muscles intact.  HEENT: Head atraumatic, normocephalic. Oropharynx and nasopharynx clear.  NECK:  Supple, no jugular venous distention. No thyroid enlargement, no tenderness.  LUNGS: Normal breath sounds bilaterally, no wheezing, rales,rhonchi or crepitation. No use of accessory muscles of respiration.  CARDIOVASCULAR: S1, S2 normal. No murmurs, rubs, or gallops.  ABDOMEN: Soft, non-tender, non-distended. Bowel sounds present. No organomegaly or mass.  EXTREMITIES: No pedal edema, cyanosis, or clubbing.  NEUROLOGIC: Cranial nerves II through XII are intact. Muscle strength 5/5 in all extremities. Sensation intact. Gait not checked.  PSYCHIATRIC: The patient is alert and oriented x 3.  SKIN: No obvious rash, lesion, or ulcer.  DATA REVIEW:   CBC Recent Labs  Lab 08/24/17 0419  WBC 7.3  HGB 12.7  HCT 37.7  PLT 258    Chemistries  Recent Labs  Lab 08/22/17 2326 08/24/17 0419  NA 136 136  K 3.6 3.4*  CL 100* 105  CO2 20* 20*  GLUCOSE 112* 78  BUN 11 <5*  CREATININE 0.87 0.64  CALCIUM 9.8 8.9  MG  --  1.7  AST 27  --   ALT 17  --   ALKPHOS 75  --   BILITOT 1.4*  --      Follow-up Information    Barr, Tina Fender, MD. Schedule an appointment as soon as possible for a visit in 1 week(s).   Specialty:  Obstetrics and Gynecology Contact information: [address] [place] [address] [phone]           Management plans discussed with the patient, family and they are in agreement.  CODE STATUS: Prior   TOTAL TIME TAKING CARE OF THIS PATIENT: 45 minutes.    Tina Barr M.D on 08/29/2017 at 2:57 PM  Between 7am to 6pm - Pager - [phone]  After 6pm go to [email or web address] - Social research officer, government  [facility]  Office  [phone]  CC: Primary care physician; System, Pcp Not In   Note: This dictation was prepared with Dragon dictation along with smaller phrase technology. Any transcriptional errors that result from this process are unintentional.

## 2017-10-20 ENCOUNTER — Ambulatory Visit: Payer: No Typology Code available for payment source | Attending: Internal Medicine

## 2017-10-20 NOTE — Pre-Procedure Instructions (Signed)
Requested  last progress note cardiology hx POTS since age 22  Dr  [facility]  [place] office [phone] has followed

## 2017-10-21 NOTE — Pre-Procedure Instructions (Signed)
Called cardiology [facility] last ekg progress note echo  [id number] [phone]

## 2017-11-09 ENCOUNTER — Encounter: Payer: Self-pay | Admitting: Pain Medicine

## 2017-11-09 NOTE — Anesthesia Preprocedure Evaluation (Deleted)
Anesthesia Evaluation    AIRWAY    Mallampati: II    TM distance: >3 FB  Neck ROM: full  Mouth Opening:full  Planned to use difficult airway equipment: No CARDIOVASCULAR    cardiovascular exam normal and normal       DENTAL         PULMONARY    pulmonary exam normal     OTHER FINDINGS                  Relevant Problems   No relevant active problems       PSS Anesthesia Comments: POTS, Asthma, Complication of anesthesia?        Anesthesia Plan      general                     intravenous induction   Detailed anesthesia plan: general IV      Post Op: other  Post op pain management: per surgeon                       Signed by: Juanell Fairly 11/09/17 2:46 PM

## 2017-11-09 NOTE — Anesthesia Preprocedure Evaluation (Signed)
Relevant Problems   No relevant active problems       PSS Anesthesia Comments: Asthma, POTS, Abdominal pain, Hx of complication of anesthesia, ?, STOP BANG=0        Anesthesia Plan      general                     intravenous induction   Detailed anesthesia plan: general IV  Monitors/Adjuncts: other    Post Op: other  Post op pain management: per surgeon        Plan discussed with CRNA.                   Signed by: Juanell Fairly 11/09/17 2:48 PM

## 2017-11-10 ENCOUNTER — Ambulatory Visit
Admission: RE | Admit: 2017-11-10 | Payer: No Typology Code available for payment source | Source: Ambulatory Visit | Admitting: Internal Medicine

## 2017-11-10 ENCOUNTER — Encounter: Admission: RE | Payer: Self-pay | Source: Ambulatory Visit

## 2017-11-10 HISTORY — DX: Other specified postprocedural states: Z98.890

## 2017-11-10 HISTORY — DX: Nausea with vomiting, unspecified: R11.2

## 2017-11-10 HISTORY — DX: Unspecified asthma, uncomplicated: J45.909

## 2017-11-10 HISTORY — DX: Other complications of anesthesia, initial encounter: T88.59XA

## 2017-11-10 SURGERY — DONT USE, USE 1095-ESOPHAGOGASTRODUODENOSCOPY (EGD), DIAGNOSTIC
Anesthesia: Monitor Anesthesia Care | Site: Abdomen

## 2017-12-30 ENCOUNTER — Emergency Department
Admission: EM | Admit: 2017-12-30 | Discharge: 2017-12-30 | Disposition: A | Discharge status: Home / Self Care | Payer: 59 | Attending: Emergency Medicine | Admitting: Emergency Medicine

## 2017-12-30 ENCOUNTER — Other Ambulatory Visit: Payer: Self-pay

## 2017-12-30 ENCOUNTER — Encounter: Payer: Self-pay | Admitting: *Deleted

## 2017-12-30 DIAGNOSIS — R1110 Vomiting, unspecified: Secondary | ICD-10-CM | POA: Diagnosis not present

## 2017-12-30 DIAGNOSIS — Z5321 Procedure and treatment not carried out due to patient leaving prior to being seen by health care provider: Secondary | ICD-10-CM | POA: Insufficient documentation

## 2017-12-30 DIAGNOSIS — R1030 Lower abdominal pain, unspecified: Secondary | ICD-10-CM | POA: Diagnosis present

## 2017-12-30 NOTE — ED Triage Notes (Signed)
Pt to ED reporting abd pain and flank pain since Wednesday. Pt started taking antibiotics yesterday. Pt was reported to have been vomiting constantly and had received 2L of NS at school. Mother reports pt has a hx of intractable vomiting that she has had to be admitted to the hospital for. PT was given Zofran and continues to gag in triage. No vomit at this time.

## 2017-12-30 NOTE — ED Notes (Signed)
Mother and daughter report that pt was able to keep antibiotic down and even though pt is gaging she is not throwing up. Mother and daughter have talked and have decided they want to go home and wait it out there. mother verbalized she would bring daughter back if she was unable to keep antibiotic down. Pt is ambulatory and in NAD.

## 2018-01-03 ENCOUNTER — Emergency Department
Admission: EM | Admit: 2018-01-03 | Discharge: 2018-01-03 | Disposition: A | Discharge status: Home / Self Care | Payer: No Typology Code available for payment source | Attending: Emergency Medicine | Admitting: Emergency Medicine

## 2018-01-03 DIAGNOSIS — G43A1 Cyclical vomiting, intractable: Secondary | ICD-10-CM | POA: Insufficient documentation

## 2018-01-03 DIAGNOSIS — R1115 Cyclical vomiting syndrome unrelated to migraine: Secondary | ICD-10-CM

## 2018-01-03 DIAGNOSIS — Q796 Ehlers-Danlos syndrome: Secondary | ICD-10-CM | POA: Insufficient documentation

## 2018-01-03 LAB — CBC AND DIFFERENTIAL
Absolute NRBC: 0 10*3/uL (ref 0.00–0.00)
Basophils Absolute Automated: 0.05 10*3/uL (ref 0.00–0.08)
Basophils Automated: 0.6 %
Eosinophils Absolute Automated: 0.05 10*3/uL (ref 0.00–0.44)
Eosinophils Automated: 0.6 %
Hematocrit: 50.1 % — ABNORMAL HIGH (ref 34.7–43.7)
Hgb: 16.6 g/dL — ABNORMAL HIGH (ref 11.4–14.8)
Immature Granulocytes Absolute: 0.03 10*3/uL (ref 0.00–0.07)
Immature Granulocytes: 0.4 %
Lymphocytes Absolute Automated: 2.64 10*3/uL (ref 0.42–3.22)
Lymphocytes Automated: 32.7 %
MCH: 29.9 pg (ref 25.1–33.5)
MCHC: 33.1 g/dL (ref 31.5–35.8)
MCV: 90.1 fL (ref 78.0–96.0)
MPV: 10 fL (ref 8.9–12.5)
Monocytes Absolute Automated: 0.63 10*3/uL (ref 0.21–0.85)
Monocytes: 7.8 %
Neutrophils Absolute: 4.67 10*3/uL (ref 1.10–6.33)
Neutrophils: 57.9 %
Nucleated RBC: 0 /100 WBC (ref 0.0–0.0)
Platelets: 340 10*3/uL (ref 142–346)
RBC: 5.56 10*6/uL — ABNORMAL HIGH (ref 3.90–5.10)
RDW: 12 % (ref 11–15)
WBC: 8.07 10*3/uL (ref 3.10–9.50)

## 2018-01-03 LAB — RAPID DRUG SCREEN, URINE
Barbiturate Screen, UR: NEGATIVE
Benzodiazepine Screen, UR: NEGATIVE
Cannabinoid Screen, UR: POSITIVE — AB
Cocaine, UR: NEGATIVE
Opiate Screen, UR: POSITIVE — AB
PCP Screen, UR: NEGATIVE
Urine Amphetamine Screen: NEGATIVE

## 2018-01-03 LAB — URINALYSIS REFLEX TO MICROSCOPIC EXAM - REFLEX TO CULTURE
Bilirubin, UA: NEGATIVE
Glucose, UA: NEGATIVE
Ketones UA: >=80 — AB
Leukocyte Esterase, UA: NEGATIVE
Nitrite, UA: NEGATIVE
Protein, UR: 100 — AB
Specific Gravity UA: 1.023 (ref 1.001–1.035)
Urine pH: 6 (ref 5.0–8.0)
Urobilinogen, UA: 2 mg/dL

## 2018-01-03 LAB — COMPREHENSIVE METABOLIC PANEL
ALT: 13 U/L (ref 0–55)
AST (SGOT): 17 U/L (ref 5–34)
Albumin/Globulin Ratio: 1.6 (ref 0.9–2.2)
Albumin: 5.1 g/dL — ABNORMAL HIGH (ref 3.5–5.0)
Alkaline Phosphatase: 75 U/L (ref 37–106)
BUN: 10 mg/dL (ref 7.0–19.0)
Bilirubin, Total: 1.2 mg/dL (ref 0.2–1.2)
CO2: 24 mEq/L (ref 22–29)
Calcium: 10.4 mg/dL (ref 8.5–10.5)
Chloride: 98 mEq/L — ABNORMAL LOW (ref 100–111)
Creatinine: 0.9 mg/dL (ref 0.6–1.0)
Globulin: 3.1 g/dL (ref 2.0–3.6)
Glucose: 79 mg/dL (ref 70–100)
Potassium: 3.4 mEq/L — ABNORMAL LOW (ref 3.5–5.1)
Protein, Total: 8.2 g/dL (ref 6.0–8.3)
Sodium: 139 mEq/L (ref 136–145)

## 2018-01-03 LAB — POCT PREGNANCY TEST, URINE HCG: POCT Pregnancy HCG Test, UR: NEGATIVE

## 2018-01-03 LAB — LIPASE: Lipase: 28 U/L (ref 8–78)

## 2018-01-03 LAB — GFR: EGFR: >60

## 2018-01-03 MED ORDER — METOCLOPRAMIDE HCL 5 MG/ML IJ SOLN
10.00 mg | Freq: Once | INTRAMUSCULAR | Status: DC
Start: 2018-01-03 — End: 2018-01-03

## 2018-01-03 MED ORDER — SODIUM CHLORIDE 0.9 % IV BOLUS
1000.00 mL | Freq: Once | INTRAVENOUS | Status: AC
Start: 2018-01-03 — End: 2018-01-03
  Administered 2018-01-03: 14:00:00 1000 mL via INTRAVENOUS

## 2018-01-03 NOTE — ED Notes (Signed)
Bed: S C4  Expected date:   Expected time:   Means of arrival:   Comments:

## 2018-01-03 NOTE — Discharge Instructions (Signed)
Dear Ms. Dorothea Ogle:    Thank you for choosing the [facility] Emergency Department, the premier emergency department in the [place] area.  I hope your visit today was EXCELLENT.    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    Cyclic vomiting syndrome (CVS) is when people suffer from repeated episodes of vomiting or nausea. They have periods of normal health in between episodes. Episodes of nausea and vomiting can last for hours or sometimes days. This condition is most commonly diagnosed in children, but is now being diagnosed in adults more often. People will usually have many tests by a doctor before they are diagnosed with this condition.     It is not known what causes cyclic vomiting syndrome. The diagnosis of CVS has been linked to migraines, food allergies and chronic use of marijuana. In some cases, treatment of these disorders has improved symptoms of CVS. The treatment of CVS is to control the symptoms. Most of the time it can be treated at home with medication for nausea and drinking fluids. Rarely, you will need to be admitted to the hospital to get IV fluids and IV medications    Having this disease can be very hard. People often have problems in everyday life, work or school. People are often fatigued (tired) and very thirsty (dehydration) during episodes of CVS. You might need another exam or more tests to find out why you have these symptoms. At this time, your symptoms do not seem to be caused by anything dangerous. You do not need to stay in the hospital.    Though we don't believe your condition is dangerous right now, it is important to be careful. Sometimes a problem that seems mild can become serious later. This is why it is very important that you return here or go to the nearest Emergency Department if you are not improving or your symptoms are getting worse.    Some things you can try at home  to improve symptoms are:   Nausea medications.   Drinking small amounts of fluid often to avoid dehydration.    Avoiding low blood sugar (hypoglycemia).   Rest.   Trying to get rid of stressful things in your life.   Staying away from any recreational drugs, especially marijuana.   Staying away from narcotic pain medications.    You should come back here or go to the nearest Emergency Department or follow up with your doctor in 2 days.    YOU SHOULD SEEK MEDICAL ATTENTION IMMEDIATELY, EITHER HERE OR AT THE NEAREST EMERGENCY DEPARTMENT, IF ANY OF THE FOLLOWING OCCUR:     You cannot keep fluids down or you have symptoms of dehydration. These can be sunken eyes, fast heart rate, lightheadedness or dizziness.   You do not feel better after treatment.   You have severe abdominal (belly) pain or pain that is getting worse.   You have chest pain or shortness of breath.    If you can't follow up with your doctor, or if at any time you feel you need to be rechecked or seen again, come back here or go to the nearest emergency department.                 If you do not continue to improve or your condition worsens, please contact your doctor or return immediately to the Emergency Department.    Sincerely,  Merrilee Seashore, MD  Attending Emergency Physician  [facility] Emergency Department    ONSITE PHARMACY  Our full service onsite pharmacy is located in the ER waiting room.  Open 7 days a week from 9 am to 9 pm.  We accept all major insurances and prices are competitive with major retailers.  Ask your provider to print your prescriptions down to the pharmacy to speed you on your way home.    OBTAINING A PRIMARY CARE APPOINTMENT    Primary care physicians (PCPs, also known as primary care doctors) are either internists or family medicine doctors. Both types of PCPs focus on health promotion, disease prevention, patient education and counseling, and treatment of acute and chronic medical  conditions.    Call for an appointment with a primary care doctor.  Ask to see who is taking new patients.     [facility]  telephone:  [phone]  [email or web address]    DOCTOR REFERRALS  Call [phone] (available 24 hours a day, 7 days a week) if you need any further referrals and we can help you find a primary care doctor or specialist.  Also, available online at:  [email or web address]    YOUR CONTACT INFORMATION  Before leaving please check with registration to make sure we have an up-to-date contact number.  You can call registration at [phone] to update your information.  For questions about your hospital bill, please call [phone].  For questions about your Emergency Dept Physician bill please call [phone].      FREE HEALTH SERVICES  If you need help with health or social services, please call 2-1-1 for a free referral to resources in your area.  2-1-1 is a free service connecting people with information on health insurance, free clinics, pregnancy, mental health, dental care, food assistance, housing, and substance abuse counseling.  Also, available online at:  http://www.[facility].org    MEDICAL RECORDS AND TESTS  Certain laboratory test results do not come back the same day, for example urine cultures.   We will contact you if other important findings are noted.  Radiology films are often reviewed again to ensure accuracy.  If there is any discrepancy, we will notify you.      Please call [phone] to pick up a complimentary CD of any radiology studies performed.  If you or your doctor would like to request a copy of your medical records, please call [phone].      ORTHOPEDIC INJURY   Please know that significant injuries can exist even when an initial x-ray is read as normal or negative.  This can occur because some fractures (broken bones) are not initially visible on x-rays.  For this reason, close outpatient follow-up with your primary care  doctor or bone specialist (orthopedist) is required.    MEDICATIONS AND FOLLOWUP  Please be aware that some prescription medications can cause drowsiness.  Use caution when driving or operating machinery.    The examination and treatment you have received in our Emergency Department is provided on an emergency basis, and is not intended to be a substitute for your primary care physician.  It is important that your doctor checks you again and that you report any new or remaining problems at that time.      24 HOUR PHARMACIES  The nearest 24 hour pharmacy is:    [facility]  [address]  [place], [place] [address]  [phone]      ASSISTANCE WITH INSURANCE  Affordable Care Act  (ACA)  Call to start or finish an application, compare plans, enroll or ask a question.  [phone]  TTY: ([phone]  Web:  Healthcare.gov    Help Enrolling in [facility]  [employer] [place]  414 096 5236 (TOLL-FREE)  ([phone] (TTY)  Web:  Http://www.coverva.org    Local Help Enrolling in the [facility]  [place] [facility]  [phone] (MAIN)  Email:  health-help@nvfs .org  Web:  BlackjackMyths.is  Address:  [address], [address] [place], [place] [address]    SEDATING MEDICATIONS  Sedating medications include strong pain medications (e.g. narcotics), muscle relaxers, benzodiazepines (used for anxiety and as muscle relaxers), Benadryl/diphenhydramine and other antihistamines for allergic reactions/itching, and other medications.  If you are unsure if you have received a sedating medication, please ask your physician or nurse.  If you received a sedating medication: DO NOT drive a car. DO NOT operate machinery. DO NOT perform jobs where you need to be alert.  DO NOT drink alcoholic beverages while taking this medicine.     If you get dizzy, sit or lie down at the first signs. Be careful going up and down stairs.  Be extra careful to prevent falls.     Never give this medicine to others.     Keep  this medicine out of reach of children.     Do not take or save old medicines. Throw them away when outdated.     Keep all medicines in a cool, dry place. DO NOT keep them in your bathroom medicine cabinet or in a cabinet above the stove.    MEDICATION REFILLS  Please be aware that we cannot refill any prescriptions through the ER. If you need further treatment from what is provided at your ER visit, please follow up with your primary care doctor or your pain management specialist.    FREESTANDING EMERGENCY DEPARTMENTS OF [facility]  Did you know [facility] has two freestanding ERs located just a few miles away?   ER of [place] and [place] [facility] of [place] have short wait times, easy free parking directly in front of the building and top patient satisfaction scores - and the same Board Certified Emergency Medicine doctors as [facility].

## 2018-01-03 NOTE — ED Provider Notes (Signed)
[address] [facility] EMERGENCY DEPARTMENT H&P      Visit date: 01/03/2018      CLINICAL SUMMARY           Diagnosis:    .     Final diagnoses:   Intractable cyclical vomiting with nausea         MDM Notes:    Bridget [name] is a 22 y.o. 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 Similar episodes of cyclic vomiting in the past. Has had recent CT which was negative for appendicitis, bowel obstruction and acute findings 2 days ago.  Patient also had negative pelvic swabs and work-up 2 days ago. Vital signs stable, afebrile.  Exam as below, abdomen benign, no tenderness. Concern for possible cyclic vomiting versus gastric motility issues versus chronic cannabinoid hyperemesis. Labs and UA unremarkable. Symptoms controlled with IVF and meds.  No indication for repeat imaging in ED today.  Patient tolerated PO. Pt and mom comfortable with discharge and continued home antinausea medication regimen.  Patient has GI to follow-up with and plan for gastric motility studies. Also encouraged cessation of THC and keep GI follow up and small meals with bland foods, and good PO hydration. Patient agrees with plan, follow up and understands return precautions.         Disposition:         Discharge         Discharge Prescriptions     None                      CLINICAL INFORMATION        HPI:      Chief Complaint: Emesis  .    Bridget [name] is a 22 y.o. 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 This is the pts 4th presentation for medical evaluation this week. Has received 5 L of IVFs for rehydration and has tried zofran, phenergan and now on reglan. Per mother she has a h/o cyclic vomiting episodes and has been evaluated by GI who is concerned she has a gastric motility issue but has not yet undergone gastric emptying study. Last episode of intractable vomiting was 5 months ago and resolved over time with zofran. Pt also  endorses moderate aching constant back and abdominal pain that has been persistent for the duration of her sxs and started after multiple episodes of vomiting. States this pain feels better when she takes a hot shower. Back pain is lumbar and bilateral, no radiation. Abdominal pain is epigastric and dull in nature. Denies fevers, coffee ground emesis, hematemesis, dysuria, hematuria, melena, hematochezia, lack of gas passage, vaginal discharge. Currently on period.     Of note pt has paperwork from OSH visit this week showing negative pelvic with swabs, negative CTAP w/ con and negative UA. Urine positive for THC, pt endorses chronic marijuana use 2-3 times a week.       History obtained from: Patient          ROS:      Positive and negative ROS elements as per HPI.  All other systems reviewed and negative.      Physical Exam:      Pulse (!) 114  BP 118/75  Resp 17  SpO2 96 %  Temp 97.8 F (36.6 C)    Physical Exam   Constitutional: She is oriented to person, place, and time. She appears well-developed and well-nourished.  HENT:   Head: Normocephalic and atraumatic.   Nose: Nose normal.   Mouth/Throat: Uvula is midline and oropharynx is clear and moist.   Eyes: Pupils are equal, round, and reactive to light. Conjunctivae and EOM are normal.   Neck: Trachea normal, normal range of motion, full passive range of motion without pain and phonation normal. Neck supple.   Cardiovascular: Normal rate, regular rhythm and intact distal pulses.    Pulmonary/Chest: Effort normal and breath sounds normal. No respiratory distress.   Abdominal: Soft. Normal appearance and bowel sounds are normal. She exhibits no distension and no mass. There is no tenderness. There is no rebound and no guarding.   Musculoskeletal: Normal range of motion.   Neurological: She is alert and oriented to person, place, and time. She has normal strength. No cranial nerve deficit or sensory deficit. Coordination and gait normal.   Skin: Skin is  warm, dry and intact.   Psychiatric: She has a normal mood and affect.   Nursing note and vitals reviewed.               PAST HISTORY        Primary Care Provider: Lind Covert, MD        PMH/PSH:    .     Past Medical History:   Diagnosis Date   . Abdominal pain    . Asthma     mild seasonal controlled   . Complication of anesthesia    . Post-operative nausea and vomiting    . POTS (postural orthostatic tachycardia syndrome)     [facility]   . Sinus trouble     seasonal       She has a past surgical history that includes Tonsillectomy and Wisdom tooth extraction.      Social/Family History:      She reports that she has never smoked. She has never used smokeless tobacco. She reports that she does not drink alcohol or use drugs.    Family History   Problem Relation Age of Onset   . Cancer Mother          Listed Medications on Arrival:    .     Home Medications     Med List Status:  Complete Set By: Jonelle Sidle, RN at 01/03/2018  1:43 PM        No Medications         Allergies: She is allergic to latex.            VISIT INFORMATION        Clinical Course in the ED:                 Medications Given in the ED:    .     ED Medication Orders     Start Ordered     Status Ordering Provider    01/03/18 1428 01/03/18 1427  metoclopramide (REGLAN) injection 10 mg  Once     Route: Intravenous  Ordered Dose: 10 mg     Ordered DYNIN, Emelin Dascenzo    01/03/18 1326 01/03/18 1325  sodium chloride 0.9 % bolus 1,000 mL  Once     Route: Intravenous  Ordered Dose: 1,000 mL     Last MAR action:  New Bag GUSTAFSON, LEAH B            Procedures:      Procedures      Interpretations:      O2 sat-  saturation: 96 %; Oxygen use: room air; Interpretation: Normal      Monitor -         interpreted by me: sinus tachycardia at 114.              RESULTS        Lab Results:      Results     Procedure Component Value Units Date/Time    UA Reflex to Micro - Reflex to Culture [[id number]]  (Abnormal) Collected:   01/03/18 1347     Updated:  01/03/18 1415     Urine Type Urine, Clean Ca     Color, UA Amber (A)     Clarity, UA Cloudy (A)     Specific Gravity UA 1.023     Urine pH 6.0     Leukocyte Esterase, UA Negative     Nitrite, UA Negative     Protein, UR 100 (A)     Glucose, UA Negative     Ketones UA >=80 (A)     Urobilinogen, UA 2.0 mg/dL      Bilirubin, UA Negative     Blood, UA Small (A)     RBC, UA 3 - 5 /hpf      WBC, UA 0 - 5 /hpf      Squamous Epithelial Cells, Urine 0 - 5 /hpf      Urine Mucus Present    Narrative:       Replace urinary catheter prior to obtaining the urine culture  if it has been in place for greater than or equal to 14  days:->N/A No Foley  Indications for U/A Reflex to Micro - Reflex to  Culture:->Suprapubic Pain/Tenderness or Dysuria    GFR [[id number]] Collected:  01/03/18 1341     Updated:  01/03/18 1412     EGFR >60.0    Comprehensive metabolic panel [[id number]]  (Abnormal) Collected:  01/03/18 1341    Specimen:  Blood Updated:  01/03/18 1412     Glucose 79 mg/dL      BUN 29.5 mg/dL      Creatinine 0.9 mg/dL      Sodium 284 mEq/L      Potassium 3.4 (L) mEq/L      Chloride 98 (L) mEq/L      CO2 24 mEq/L      Calcium 10.4 mg/dL      Protein, Total 8.2 g/dL      Albumin 5.1 (H) g/dL      AST (SGOT) 17 U/L      ALT 13 U/L      Alkaline Phosphatase 75 U/L      Bilirubin, Total 1.2 mg/dL      Globulin 3.1 g/dL      Albumin/Globulin Ratio 1.6    Rapid drug screen, urine [[id number]]  (Abnormal) Collected:  01/03/18 1341    Specimen:  Urine Updated:  01/03/18 1409     Amphetamine Screen, UR Negative     Barbiturate Screen, UR Negative     Benzodiazepine Screen, UR Negative     Cannabinoid Screen, UR Positive (A)     Cocaine, UR Negative     Opiate Screen, UR Positive (A)     PCP Screen, UR Negative    CBC with differential [[id number]]  (Abnormal) Collected:  01/03/18 1341    Specimen:  Blood from Blood Updated:  01/03/18 1358     WBC 8.07 x10 3/uL      Hgb 16.6 (H) g/dL  Hematocrit 50.1 (H) %       Platelets 340 x10 3/uL      RBC 5.56 (H) x10 6/uL      MCV 90.1 fL      MCH 29.9 pg      MCHC 33.1 g/dL      RDW 12 %      MPV 10.0 fL      Neutrophils 57.9 %      Lymphocytes Automated 32.7 %      Monocytes 7.8 %      Eosinophils Automated 0.6 %      Basophils Automated 0.6 %      Immature Granulocyte 0.4 %      Nucleated RBC 0.0 /100 WBC      Neutrophils Absolute 4.67 x10 3/uL      Abs Lymph Automated 2.64 x10 3/uL      Abs Mono Automated 0.63 x10 3/uL      Abs Eos Automated 0.05 x10 3/uL      Absolute Baso Automated 0.05 x10 3/uL      Absolute Immature Granulocyte 0.03 x10 3/uL      Absolute NRBC 0.00 x10 3/uL     Urine HCG, POC/ Qualitative [[id number]] Collected:  01/03/18 1348    Specimen:  Urine Updated:  01/03/18 1350     POCT QC Pass     POCT Pregnancy HCG Test, UR Negative     Comment: Negative Value is Normal in Healthy Males or Healthy non-pregnant Females              Radiology Results:      No orders to display               Scribe Attestation:      I was acting as a Neurosurgeon for Merrilee Seashore, MD on [name],[name]  Treatment Team: Scribe: Lujean Rave     I am the first provider for this patient and I personally performed the services documented. Treatment Team: Scribe: Lujean Rave is scribing for me on [name],[name]. This note and the patient instructions accurately reflect work and decisions made by me.  Merrilee Seashore, MD          Merrilee Seashore, MD  01/06/18 [phone]

## 2018-01-03 NOTE — ED Provider Notes (Signed)
[facility] EMERGENCY DEPARTMENT RESIDENT H&P       CLINICAL INFORMATION        HPI:      Chief Complaint: Emesis  .    Bridget [name] is a 22 y.o. female with ehlers-danlos syndrome and POTS presents with 1 wk of persistent n/v. Unable to keep down liquids. This is the pts 4th presentation for medical evaluation this week. Has received 5 L of IVFs for rehydration and has tried zofran, phenergan and now on reglan. Per mother she has a h/o cyclic vomiting episodes and has been evaluated by GI who is concerned she has a gastric motility issue but has not yet undergone gastric emptying study. Last episode of intractable vomiting was 5 months ago and resolved over time with zofran. Pt also endorses back and abdominal pain that has been constant for the duration of her sxs and started after multiple episodes of vomiting. States this pain feels better when she takes a hot shower. Back pain is lumbar and bilateral, no radiation. Abdominal pain is epigastric and dull in nature. Denies fevers, dysuria, hematuria, sexual activity. Currently on period.     Of note pt has paperwork from this week showing negative pelvic with swabs, negative CTAP w/ con and negative UA. Urine positive for THC, pt endorses chronic marijuana use 2-3 times a week.     History obtained from: patient, family             ROS:      Review of Systems   Constitutional: Positive for activity change, appetite change and fatigue. Negative for chills, fever and unexpected weight change.   HENT: Negative for congestion and sore throat.    Eyes: Negative for pain and redness.   Respiratory: Negative for cough, chest tightness and shortness of breath.    Cardiovascular: Negative for chest pain and palpitations.   Gastrointestinal: Positive for abdominal pain, nausea and vomiting. Negative for abdominal distention, blood in stool, constipation and diarrhea.   Genitourinary: Negative for difficulty urinating, dysuria, flank pain, frequency,  hematuria, vaginal bleeding and vaginal discharge.   Musculoskeletal: Positive for back pain.   Skin: Negative for rash.   Neurological: Negative for dizziness, syncope, weakness, light-headedness and headaches.         Physical Exam:      Pulse (!) 114  BP 118/75  Resp 17  SpO2 96 %  Temp 97.8 F (36.6 C)    Physical Exam   Constitutional: She is oriented to person, place, and time. She appears well-developed.   Thin body habitus    HENT:   Head: Normocephalic and atraumatic.   Mouth/Throat: Oropharynx is clear and moist.   Eyes: Pupils are equal, round, and reactive to light. Conjunctivae and EOM are normal.   Neck: Normal range of motion.   Cardiovascular: Regular rhythm.    Tachy to 110s   Pulmonary/Chest: Effort normal and breath sounds normal. No respiratory distress.   Abdominal: Soft. She exhibits no distension. There is tenderness. There is no guarding.   Epigastric tenderness to palpation   Musculoskeletal: Normal range of motion. She exhibits no edema.   Lymphadenopathy:     She has no cervical adenopathy.   Neurological: She is alert and oriented to person, place, and time.   Skin: Skin is warm and dry. No rash noted.   Psychiatric: She has a normal mood and affect.               PAST HISTORY  Primary Care Provider: Lind Covert, MD        PMH/PSH:    .     Past Medical History:   Diagnosis Date   . Abdominal pain    . Asthma     mild seasonal controlled   . Complication of anesthesia    . Post-operative nausea and vomiting    . POTS (postural orthostatic tachycardia syndrome)     [facility]   . Sinus trouble     seasonal       She has a past surgical history that includes Tonsillectomy and Wisdom tooth extraction.      Social/Family History:      She reports that she has never smoked. She has never used smokeless tobacco. She reports that she does not drink alcohol or use drugs.    Family History   Problem Relation Age of Onset   . Cancer Mother          Listed  Medications on Arrival:    .     Home Medications     None on File         Allergies: She is allergic to latex.            VISIT INFORMATION        Reassessments/Clinical Course:      Pt states nausea is well controlled at this time - took phenergan and reglan pta. Concern for THC related cyclic vomiting discussed with daughter and mother however they do not agree with this concern stating pt uses marijuana even when she is not vomiting. Have GI doctor to follow up with. Plan for labs and IVF hydration. Encouraged cessation of THC and GI follow up for gastric motility study.       Conversations with Other Providers:              Medications Given in the ED:    .     ED Medication Orders     Start Ordered     Status Ordering Provider    01/03/18 1326 01/03/18 1325  sodium chloride 0.9 % bolus 1,000 mL  Once     Route: Intravenous  Ordered Dose: 1,000 mL     Ordered Cici Rodriges B            Procedures:      Procedures      Assessment/Plan:      22 yo F with h/o POTS and ehlers danlos syndrome presents with 1 wk of n/v. Similar episodes of cyclic vomiting in the past. Has GI to follow up with. Concern for chronic THC use contributing to sxs.            Diego Cory, MD  Resident  01/03/18 1355       Diego Cory, MD  Resident  01/03/18 [phone]

## 2018-01-07 ENCOUNTER — Emergency Department
Admission: EM | Admit: 2018-01-07 | Discharge: 2018-01-07 | Disposition: A | Discharge status: Home / Self Care | Payer: No Typology Code available for payment source | Attending: Emergency Medical Services | Admitting: Emergency Medical Services

## 2018-01-07 DIAGNOSIS — R112 Nausea with vomiting, unspecified: Secondary | ICD-10-CM | POA: Insufficient documentation

## 2018-01-07 DIAGNOSIS — Z91040 Latex allergy status: Secondary | ICD-10-CM | POA: Insufficient documentation

## 2018-01-07 DIAGNOSIS — Q796 Ehlers-Danlos syndrome: Secondary | ICD-10-CM | POA: Insufficient documentation

## 2018-01-07 DIAGNOSIS — F1220 Cannabis dependence, uncomplicated: Secondary | ICD-10-CM | POA: Insufficient documentation

## 2018-01-07 DIAGNOSIS — Z8719 Personal history of other diseases of the digestive system: Secondary | ICD-10-CM | POA: Insufficient documentation

## 2018-01-07 DIAGNOSIS — I498 Other specified cardiac arrhythmias: Secondary | ICD-10-CM | POA: Insufficient documentation

## 2018-01-07 LAB — URINALYSIS, REFLEX TO MICROSCOPIC EXAM IF INDICATED
Bilirubin, UA: NEGATIVE
Blood, UA: NEGATIVE
Glucose, UA: NEGATIVE
Ketones UA: 80 — AB
Leukocyte Esterase, UA: NEGATIVE
Nitrite, UA: NEGATIVE
Protein, UR: 100 — AB
Specific Gravity UA: 1.027 (ref 1.001–1.035)
Urine pH: 6 (ref 5.0–8.0)
Urobilinogen, UA: 2 mg/dL

## 2018-01-07 LAB — HEPATIC FUNCTION PANEL
ALT: 12 U/L (ref 0–55)
AST (SGOT): 14 U/L (ref 5–34)
Albumin/Globulin Ratio: 1.7 (ref 0.9–2.2)
Albumin: 4.8 g/dL (ref 3.5–5.0)
Alkaline Phosphatase: 76 U/L (ref 37–106)
Bilirubin Direct: 0.4 mg/dL (ref 0.0–0.5)
Bilirubin Indirect: 0.7 mg/dL (ref 0.2–1.0)
Bilirubin, Total: 1.1 mg/dL (ref 0.2–1.2)
Globulin: 2.9 g/dL (ref 2.0–3.6)
Protein, Total: 7.7 g/dL (ref 6.0–8.3)

## 2018-01-07 LAB — HCG, SERUM, QUALITATIVE: Hcg Qualitative: NEGATIVE

## 2018-01-07 LAB — RAPID DRUG SCREEN, URINE
Barbiturate Screen, UR: NEGATIVE
Benzodiazepine Screen, UR: NEGATIVE
Cannabinoid Screen, UR: POSITIVE — AB
Cocaine, UR: NEGATIVE
Opiate Screen, UR: NEGATIVE
PCP Screen, UR: NEGATIVE
Urine Amphetamine Screen: NEGATIVE

## 2018-01-07 LAB — CBC AND DIFFERENTIAL
Absolute NRBC: 0 10*3/uL (ref 0.00–0.00)
Basophils Absolute Automated: 0.07 10*3/uL (ref 0.00–0.08)
Basophils Automated: 0.9 %
Eosinophils Absolute Automated: 0.04 10*3/uL (ref 0.00–0.44)
Eosinophils Automated: 0.5 %
Hematocrit: 48.6 % — ABNORMAL HIGH (ref 34.7–43.7)
Hgb: 17 g/dL — ABNORMAL HIGH (ref 11.4–14.8)
Immature Granulocytes Absolute: 0.02 10*3/uL (ref 0.00–0.07)
Immature Granulocytes: 0.3 %
Lymphocytes Absolute Automated: 2.1 10*3/uL (ref 0.42–3.22)
Lymphocytes Automated: 26.9 %
MCH: 30.6 pg (ref 25.1–33.5)
MCHC: 35 g/dL (ref 31.5–35.8)
MCV: 87.4 fL (ref 78.0–96.0)
MPV: 10.1 fL (ref 8.9–12.5)
Monocytes Absolute Automated: 0.68 10*3/uL (ref 0.21–0.85)
Monocytes: 8.7 %
Neutrophils Absolute: 4.9 10*3/uL (ref 1.10–6.33)
Neutrophils: 62.7 %
Nucleated RBC: 0 /100 WBC (ref 0.0–0.0)
Platelets: 303 10*3/uL (ref 142–346)
RBC: 5.56 10*6/uL — ABNORMAL HIGH (ref 3.90–5.10)
RDW: 12 % (ref 11–15)
WBC: 7.81 10*3/uL (ref 3.10–9.50)

## 2018-01-07 LAB — BASIC METABOLIC PANEL
Anion Gap: 21 — ABNORMAL HIGH (ref 5.0–15.0)
BUN: 14 mg/dL (ref 7–19)
CO2: 19 mEq/L — ABNORMAL LOW (ref 22–29)
Calcium: 9.9 mg/dL (ref 8.5–10.5)
Chloride: 96 mEq/L — ABNORMAL LOW (ref 100–111)
Creatinine: 0.9 mg/dL (ref 0.6–1.0)
Glucose: 98 mg/dL (ref 70–100)
Potassium: 3.6 mEq/L (ref 3.5–5.1)
Sodium: 136 mEq/L (ref 136–145)

## 2018-01-07 LAB — TROPONIN I: Troponin I: <0.01 ng/mL (ref 0.00–0.09)

## 2018-01-07 LAB — GFR: EGFR: >60

## 2018-01-07 LAB — LIPASE: Lipase: 16 U/L (ref 8–78)

## 2018-01-07 MED ORDER — FAMOTIDINE 10 MG/ML IV SOLN (WRAP)
20.00 mg | Freq: Once | INTRAVENOUS | Status: AC
Start: 2018-01-07 — End: 2018-01-07
  Administered 2018-01-07: 14:00:00 20 mg via INTRAVENOUS
  Filled 2018-01-07: qty 2

## 2018-01-07 MED ORDER — SODIUM CHLORIDE 0.9 % IV BOLUS
1000.00 mL | Freq: Once | INTRAVENOUS | Status: AC
Start: 2018-01-07 — End: 2018-01-07
  Administered 2018-01-07: 14:00:00 1000 mL via INTRAVENOUS

## 2018-01-07 MED ORDER — SODIUM CHLORIDE 0.9 % IV BOLUS
1000.00 mL | Freq: Once | INTRAVENOUS | Status: DC
Start: 2018-01-07 — End: 2018-01-07

## 2018-01-07 MED ORDER — KETOROLAC TROMETHAMINE 15 MG/ML IJ SOLN
15.00 mg | Freq: Once | INTRAMUSCULAR | Status: AC
Start: 2018-01-07 — End: 2018-01-07
  Administered 2018-01-07: 14:00:00 15 mg via INTRAVENOUS
  Filled 2018-01-07: qty 1

## 2018-01-07 MED ORDER — METOCLOPRAMIDE HCL 10 MG PO TABS
10.00 mg | ORAL_TABLET | Freq: Four times a day (QID) | ORAL | 0 refills | Status: DC
Start: 2018-01-07 — End: 2020-02-01

## 2018-01-07 MED ORDER — METOCLOPRAMIDE HCL 5 MG/ML IJ SOLN
10.00 mg | Freq: Once | INTRAMUSCULAR | Status: AC
Start: 2018-01-07 — End: 2018-01-07
  Administered 2018-01-07: 14:00:00 10 mg via INTRAVENOUS
  Filled 2018-01-07: qty 2

## 2018-01-07 NOTE — Discharge Instructions (Signed)
Dear [name]:    I appreciate your choosing the [facility] for your healthcare needs, and hope your visit today was EXCELLENT.    Instructions:  Please follow-up with Gastroenterology as you are scheduled to do so. You may also follow up with your primary care physician in 1-2 days.       Cannabinoid Hyperemesis Syndrome    You were diagnosed with cannabinoid hyperemesis syndrome.    This is a condition in which a patient often has severe vomiting after long-term use of marijuana. Marijuana is also known as cannabis and tetrahydrocannabinol (THC). It is the most used illegal drug in the [place]. It can be eaten or smoked.    Some effects of using marijuana are:    Mood changes.   Sedation (make you sleepy). It can also cause stimulation (increase in activity).   Impaired judgment and/or memory.   Increased appetite and heart rate.    Decreased blood pressure.    Nausea (feeling sick), vomiting (throwing up), and abdominal (belly) pain.     Long-term use of marijuana can cause symptoms like permanent memory problems. It can also lead to chronic (long-term) nausea, vomiting, and abdominal pain. This is true even though short-term marijuana use often helps with these problems when medically supervised.    Cannabinoid hyperemesis syndrome is the diagnosis when the patient has the following:    Long-term use of marijuana.   Nausea and vomiting that will not go away.   Vomiting that is cyclic (coming and going on a regular schedule or cycle).   Improvement of symptoms after staying away from marijuana.    The only treatment of cannabinoid hyperemesis syndrome is to stop using marijuana. Some patients say that their symptoms get better by taking a hot shower, but this is just a temporary fix.    You are safe to go home. It is important to stop using marijuana to help resolve your nausea, vomiting, and belly pain.     We don't think your condition is dangerous right  now. Still, it is important to be careful. Sometimes a problem that seems small can get serious later. This is why it is very important to come back here or go to the nearest Emergency Department if you don't get better or your symptoms get worse.    Take all medicines as prescribed. Your doctor may prescribe pain medications to treat yourpain and/or nausea. You can also use over-the-counter medicines like acetaminophen (Tylenol) or anti-inflammatory medicinelike ibuprofen (Advil, Motrin) or naproxen (Aleve, Naprosyn). It is important to follow the directions for taking these medications.    Keep all your doctor's appointments. Following up with your doctor or the referral doctor is very important.    YOU SHOULD SEEK MEDICAL ATTENTION IMMEDIATELY, EITHER HERE OR AT THE NEAREST EMERGENCY DEPARTMENT, IF ANY OF THE FOLLOWING OCCUR:     You are vomiting (throwing up) and can't keep fluids down.   You have a faster heart rate, feel lightheaded, or pass out (lose consciousness).   You are vomiting blood or have severe chest pain after vomiting.   You have a fever (temperature higher than 100.73F or 38C), chills, or belly pain.    If you can't follow up with your doctor, or if at any time you feel you need to be rechecked or seen again, come back here or go to the nearest emergency department.  Return to the Emergency Department for any worsening symptoms or concerns.    Below is some information that our patients often find helpful.    We wish you good health and please do not hesitate to contact us if we can ever be of any assistance.    Sincerely,  Harden Mo, MD  [facility]    ________________________________________________________________    If you do not continue to improve or your condition worsens, please contact your doctor or return immediately to the Emergency Department.    Thank you for choosing [facility] for your emergency care  needs.  We strive to provide EXCELLENT care to you and your family.      DOCTOR REFERRALS  Call [phone] if you need any further referrals and we can help you find a primary care doctor or specialist.  Also, available online at:  [email or web address]    YOUR CONTACT INFORMATION  Before leaving please check with registration to make sure we have an up-to-date contact number.  You can call registration at [phone] to update your information.  For questions about your hospital bill, please call [phone].  For questions about your Emergency Dept Physician bill please call [phone].      FREE HEALTH SERVICES  If you need help with health or social services, please call 2-1-1 for a free referral to resources in your area.  2-1-1 is a free service connecting people with information on health insurance, free clinics, pregnancy, mental health, dental care, food assistance, housing, and substance abuse counseling.  Also, available online at:  http://www.[facility].org    MEDICAL RECORDS AND TESTS  Certain laboratory test results do not come back the same day, for example urine cultures.   We will contact you if other important findings are noted.  Radiology films are often reviewed again to ensure accuracy.  If there is any discrepancy, we will notify you.      Please call [phone] to pick up a complimentary CD of any radiology studies performed.  If you or your doctor would like to request a copy of your medical records, please call [phone].      ORTHOPEDIC INJURY   Please know that significant injuries can exist even when an initial x-ray is read as normal or negative.  This can occur because some fractures (broken bones) are not initially visible on x-rays.  For this reason, close outpatient follow-up with your primary care doctor or bone specialist (orthopedist) is required.    MEDICATIONS AND FOLLOWUP  Please be aware that some prescription medications can cause  drowsiness.  Use caution when driving or operating machinery.    The examination and treatment you have received in our Emergency Department is provided on an emergency basis, and is not intended to be a substitute for your primary care physician.  It is important that your doctor checks you again and that you report any new or remaining problems at that time.      24 [facility] PHARMACIES  [facility] - [address], [place], [place] [address] (1.4 miles, 7 minutes)  [facility] - [address] [place], [place] [address] (6.5 miles, 13 minutes)  Handout with directions available on request

## 2018-01-07 NOTE — ED Provider Notes (Signed)
Physician/Midlevel provider first contact with patient: 01/07/18 1358         EMERGENCY DEPARTMENT HISTORY AND PHYSICAL EXAM    Date: 01/07/18  Patient Name: Bridget [name],Bridget [name]  Attending Physician: Bridget East, MD  Patient DOB:  09-Aug-1996  MRN:  [id number]  Room:  17/B17        History of Presenting Illness     Chief Complaint:    Chief Complaint   Patient presents with   . Abdominal Pain   . Emesis       Historian:  Patient    22 y.o. female with h/o cannabis hyperemesis syndrome, POTS, Bridget [name] p/w acute on chronic cyclical vomiting which worsened in severity and frequency last week. Pt has had intermittent days-long periods of this vomiting since January 2019. Associated sxs include malaise, abd pain, and nausea. No fevers or chills. Pt has taken Zofran in the past with relief, but states since the vomiting worsened it has given no relief. Showering does occassionally give some relief, though not always. Mother states in the past IV Phenergan and antacids have given relief. Pt went to see GI yesterday and has endoscopy scheduled for next month. Mother p/w multiple theories of what pt may have to include delayed gastric emptying and mast cell disorder. Pt was seen at [facility] ED for same 3 days ago. Last THC use 10 days ago per pt, though mother and pt disagree on this point.         PMD: Bridget Covert, MD      Past Medical History     Past Medical History:   Diagnosis Date   . Abdominal pain    . Asthma     mild seasonal controlled   . Complication of anesthesia    . Post-operative nausea and vomiting    . POTS (postural orthostatic tachycardia syndrome)     [facility]   . Sinus trouble     seasonal       Past Surgical History     Past Surgical History:   Procedure Laterality Date   . TONSILLECTOMY     . WISDOM TOOTH EXTRACTION         Family History     Family History   Problem Relation Age of Onset   . Cancer Mother        Social History     Social History     Social History   .  Marital status: Single     Spouse name: N/[name]   . Number of children: N/[name]   . Years of education: N/[name]     Social History Main Topics   . Smoking status: Never Smoker   . Smokeless tobacco: Never Used   . Alcohol use No   . Drug use: No   . Sexual activity: No      Comment: takes the Pill continuously     Other Topics Concern   . Poor School Performance No   . International Travel In Past 12 Mos No     Social History Narrative   . No narrative on file       Allergies     Allergies   Allergen Reactions   . Latex      Itching irritation swelling condoms       Home Medications     Home medications reviewed by ED MD     Discharge Medication List as of 01/07/2018  4:02 PM  Review of Systems     Constitutional: + malaise, No fever  Eyes: No discharge   ENT: No ST  CV:  No CP   Resp:  No SOB or cough  GI: + nausea, + vomiting, + abd pain   GU: No dysuria  Skin: No rash  Neuro:  No HA  Psych:  No behavior changes  All other systems reviewed and negative    Physical Exam     BP 117/72   Pulse 97   Temp 98.3 F (36.8 C)   Resp 18   Wt 45.9 kg   LMP 12/24/2017   SpO2 98%   BMI 17.37 kg/m     CONSTITUTIONAL Patient is afebrile, Vital signs reviewed. Tachycardic.   HEAD Atraumatic, Normocephalic.  EYES No discharge from eyes, Sclera are normal.  NECK   Normal ROM, Cervical spine nontender  RESPIRATORY CHEST Chest is nontender, Breath sounds normal, No respiratory distress.  CARDIOVASCULAR Tachycardic rate and regular rhtyhm, Heart sounds normal.  ABDOMEN Mild diffuse abd tenderness, No peritoneal signs, No distension  BACK   There is no CVA tenderness, There is no tenderness to palpation  UPPER EXTREMITY No cyanosis, No edema  LOWER EXTREMITY No cyanosis, No edema  NEURO GCS is 15, No focal motor deficits, No focal sensory deficits.  SKIN Skin is warm, Skin is dry.  PSYCHIATRIC Normal affect, Normal insight        Monitors, EKG     EKG (interpreted by ED physician): NSR at [name] rate of 89 bpm. NSTE    Cardiac Monitor  (interpreted by ED physician):  na      Orders Placed During This Encounter     Orders Placed This Encounter   Procedures   . Basic Metabolic Panel   . CBC and differential   . Lipase   . Hepatic function panel (LFT)   . UA, Reflex to Microscopic (pts  3 + yrs)   . GFR   . hCG, Qualitative (Pos/Neg)   . Troponin I   . Rapid drug screen, urine   . ED Unit Sec Comm Order   . ECG 12 lead   . Saline lock IV         ED Medications Administered     ED Medication Orders     Start Ordered     Status Ordering Provider    01/07/18 1415 01/07/18 1414    Once     Route: Intravenous  Ordered Dose: 1,000 mL     Discontinued Zahir Eisenhour L    01/07/18 1415 01/07/18 1414  metoclopramide (REGLAN) injection 10 mg  Once     Route: Intravenous  Ordered Dose: 10 mg     Last MAR action:  Given Kanijah Groseclose L    01/07/18 1415 01/07/18 1414  ketorolac (TORADOL) injection 15 mg  Once     Route: Intravenous  Ordered Dose: 15 mg     Last MAR action:  Given Yulissa Needham L    01/07/18 1415 01/07/18 1414  famotidine (PEPCID) injection 20 mg  Once     Route: Intravenous  Ordered Dose: 20 mg     Last MAR action:  Given Jayant Kriz L    01/07/18 1356 01/07/18 1355  sodium chloride 0.9 % bolus 1,000 mL  Once     Route: Intravenous  Ordered Dose: 1,000 mL     Last MAR action:  [employer], [place] L  Data Review     Nursing Records Reviewed and Agree: Yes  Laboratory results reviewed by ED provider: if applicable yes  Radiologic study results reviewed by ED provider:  If applicable yes      I, Bridget East, MD, personally performed the services documented. Bridget [name] is scribing for me on Bridget [name],Bridget [name]. I reviewed and confirm the accuracy of the information in this medical record.    I, Bridget [name], am serving as [name] Neurosurgeon to document services personally performed by Bridget East, MD, based on the provider's statements to me.     Credentials: Bridget [name], scribe    Rendering Provider: Blanche East, MD      Diagnostic Study Results      Labs     Results     Procedure Component Value Units Date/Time    UA, Reflex to Microscopic (pts  3 + yrs) [[id number]]  (Abnormal) Collected:  01/07/18 1518    Specimen:  Urine Updated:  01/07/18 1550     Urine Type Clean Catch     Color, UA Yellow     Clarity, UA Hazy     Specific Gravity UA 1.027     Urine pH 6.0     Leukocyte Esterase, UA Negative     Nitrite, UA Negative     Protein, UR 100 ([name])     Glucose, UA Negative     Ketones UA 80 ([name])     Urobilinogen, UA 2.0 mg/dL      Bilirubin, UA Negative     Blood, UA Negative     RBC, UA 0 - 2 /hpf      WBC, UA 6 - 10 ([name]) /hpf      Squamous Epithelial Cells, Urine 0 - 5 /hpf      Urine Mucus Present    Rapid drug screen, urine [[id number]]  (Abnormal) Collected:  01/07/18 1518    Specimen:  Urine Updated:  01/07/18 1549     Amphetamine Screen, UR Negative     Barbiturate Screen, UR Negative     Benzodiazepine Screen, UR Negative     Cannabinoid Screen, UR Positive ([name])     Cocaine, UR Negative     Opiate Screen, UR Negative     PCP Screen, UR Negative    Troponin I [[id number]] Collected:  01/07/18 1416    Specimen:  Blood Updated:  01/07/18 1448     Troponin I <0.01 ng/mL     Basic Metabolic Panel [[id number]]  (Abnormal) Collected:  01/07/18 1416    Specimen:  Blood Updated:  01/07/18 1445     Glucose 98 mg/dL      BUN 14 mg/dL      Creatinine 0.9 mg/dL      Calcium 9.9 mg/dL      Sodium 433 mEq/L      Potassium 3.6 mEq/L      Chloride 96 (L) mEq/L      CO2 19 (L) mEq/L      Anion Gap 21.0 (H)    Lipase [[id number]] Collected:  01/07/18 1416    Specimen:  Blood Updated:  01/07/18 1445     Lipase 16 U/L     Hepatic function panel (LFT) [[id number]] Collected:  01/07/18 1416    Specimen:  Blood Updated:  01/07/18 1445     Bilirubin, Total 1.1 mg/dL      Bilirubin, Direct 0.4 mg/dL      Bilirubin, Indirect 0.7 mg/dL      AST (SGOT) 14  U/L      ALT 12 U/L      Alkaline Phosphatase 76 U/L      Protein, Total 7.7 g/dL      Albumin 4.8 g/dL      Globulin 2.9 g/dL       Albumin/Globulin Ratio 1.7    GFR [[id number]] Collected:  01/07/18 1416     Updated:  01/07/18 1445     EGFR >60.0    hCG, Qualitative (Pos/Neg) [[id number]] Collected:  01/07/18 1416    Specimen:  Blood Updated:  01/07/18 1434     Hcg Qualitative Negative    CBC and differential [[id number]]  (Abnormal) Collected:  01/07/18 1416    Specimen:  Blood from Blood Updated:  01/07/18 1425     WBC 7.81 x10 3/uL      Hgb 17.0 (H) g/dL      Hematocrit 84.6 (H) %      Platelets 303 x10 3/uL      RBC 5.56 (H) x10 6/uL      MCV 87.4 fL      MCH 30.6 pg      MCHC 35.0 g/dL      RDW 12 %      MPV 10.1 fL      Neutrophils 62.7 %      Lymphocytes Automated 26.9 %      Monocytes 8.7 %      Eosinophils Automated 0.5 %      Basophils Automated 0.9 %      Immature Granulocyte 0.3 %      Nucleated RBC 0.0 /100 WBC      Neutrophils Absolute 4.90 x10 3/uL      Abs Lymph Automated 2.10 x10 3/uL      Abs Mono Automated 0.68 x10 3/uL      Abs Eos Automated 0.04 x10 3/uL      Absolute Baso Automated 0.07 x10 3/uL      Absolute Immature Granulocyte 0.02 x10 3/uL      Absolute NRBC 0.00 x10 3/uL           Radiologic Studies  Radiology Results (24 Hour)     ** No results found for the last 24 hours. **      .        Procedures     na    MDM and Clinical Notes     MDM:    Hx, PEx, and imaging were done to evaluate pt. No imaging. VS have returned to normal. Pt tolerating PO though does appear slightly dehydrated. Wants to go home. Pt will f/u with their GI doctor.     Consults/Reevaluation:     2:00 PM On review of record, pt was seen at [facility] ER 3 days ago as per their note pt has [name] h/o cyclical vomiting. She had paperwork at that time which showed negative pelvic swabs, negative CT abd pelvis, positive THC, Pt endorses chronic marijuana use 2-3x/week.        4:06 PM Re-eval:   Feeling much better, VS stable,  no acute distress, looks well.     Counseled re dx  Counseled re follow up  Answered all questions  Counseled red flags and signs and sxs  to return for.  Comfortable with follow up and discharge plan      Return Precautions    The patient is aware that this evaluation is only [name] screening for emergent conditions related to his or her symptoms and presentation.  I discussed the need for prompt follow-up.  Patient demonstrates verbal understanding.  Patient advised to return to the ED for any worsening symptoms, uncontrolled pain if applicable, worsening fevers, or any changes in their condition prompting concern and need for repeat evaluation and/or additional management.    4:18 PM D/w Dr. Cleophus Molt, gastroenterologist at [facility], who said okay to d/c and will f/u with pt.    Diagnosis and Disposition   Diagnosis/Clinical Impression:  1. Non-intractable vomiting with nausea, unspecified vomiting type        Disposition  ED Disposition     ED Disposition Condition Date/Time Comment    Discharge  Sat Jan 07, 2018  4:02 PM Ether Griffins discharge to home/self care.    Condition at disposition: Stable          Prescriptions    Discharge Medication List as of 01/07/2018  4:02 PM      START taking these medications    Details   metoclopramide (REGLAN) 10 MG tablet Take 1 tablet (10 mg total) by mouth 4 (four) times daily, Starting Sat 01/07/2018, Print               Critical Care     Critical care exclusive of time spent performing procedures.    Total time:  na          Signout If Applicable     Patient signed out to:  na    Signout notes:  na             Harden Mo, MD  01/07/18 [phone]

## 2018-01-08 LAB — ECG 12-LEAD
Atrial Rate: 89 {beats}/min
P Axis: 67 degrees
P-R Interval: 124 ms
Q-T Interval: 358 ms
QRS Duration: 92 ms
QTC Calculation (Bezet): 435 ms
R Axis: 67 degrees
T Axis: 68 degrees
Ventricular Rate: 89 {beats}/min

## 2018-01-13 ENCOUNTER — Ambulatory Visit: Payer: No Typology Code available for payment source | Attending: Internal Medicine

## 2018-01-23 ENCOUNTER — Ambulatory Visit
Admission: RE | Admit: 2018-01-23 | Discharge: 2018-01-23 | Disposition: A | Discharge status: Home / Self Care | Payer: No Typology Code available for payment source | Source: Ambulatory Visit | Attending: Gastroenterology | Admitting: Gastroenterology

## 2018-01-23 ENCOUNTER — Encounter
Admission: RE | Disposition: A | Discharge status: Home / Self Care | Payer: Self-pay | Source: Ambulatory Visit | Attending: Gastroenterology

## 2018-01-23 ENCOUNTER — Ambulatory Visit: Payer: Self-pay

## 2018-01-23 ENCOUNTER — Ambulatory Visit: Payer: No Typology Code available for payment source | Admitting: Certified Registered"

## 2018-01-23 DIAGNOSIS — R112 Nausea with vomiting, unspecified: Secondary | ICD-10-CM | POA: Insufficient documentation

## 2018-01-23 DIAGNOSIS — K219 Gastro-esophageal reflux disease without esophagitis: Secondary | ICD-10-CM

## 2018-01-23 DIAGNOSIS — J45909 Unspecified asthma, uncomplicated: Secondary | ICD-10-CM | POA: Insufficient documentation

## 2018-01-23 DIAGNOSIS — K449 Diaphragmatic hernia without obstruction or gangrene: Secondary | ICD-10-CM | POA: Insufficient documentation

## 2018-01-23 HISTORY — PX: EGD: SHX3789

## 2018-01-23 SURGERY — DONT USE, USE 1095-ESOPHAGOGASTRODUODENOSCOPY (EGD), DIAGNOSTIC
Anesthesia: Anesthesia General | Site: Abdomen | Wound class: Clean Contaminated

## 2018-01-23 MED ORDER — SODIUM CHLORIDE 0.9 % IV SOLN
INTRAVENOUS | Status: DC
Start: 2018-01-23 — End: 2018-01-23

## 2018-01-23 MED ORDER — PROPOFOL 10 MG/ML IV EMUL (WRAP)
INTRAVENOUS | Status: DC | PRN
Start: 2018-01-23 — End: 2018-01-23
  Administered 2018-01-23: 30 mg via INTRAVENOUS
  Administered 2018-01-23: 20 mg via INTRAVENOUS
  Administered 2018-01-23: 60 mg via INTRAVENOUS
  Administered 2018-01-23: 20 mg via INTRAVENOUS
  Administered 2018-01-23: 60 mg via INTRAVENOUS
  Administered 2018-01-23: 30 mg via INTRAVENOUS
  Administered 2018-01-23: 20 mg via INTRAVENOUS

## 2018-01-23 MED ORDER — LIDOCAINE HCL 2 % IJ SOLN
INTRAMUSCULAR | Status: DC | PRN
Start: 2018-01-23 — End: 2018-01-23
  Administered 2018-01-23: 40 mg via INTRAVENOUS

## 2018-01-23 MED ORDER — PROPOFOL 10 MG/ML IV EMUL (WRAP)
INTRAVENOUS | Status: AC
Start: 2018-01-23 — End: ?
  Filled 2018-01-23: qty 20

## 2018-01-23 MED ORDER — LACTATED RINGERS IV SOLN
INTRAVENOUS | Status: DC
Start: 2018-01-23 — End: 2018-01-23

## 2018-01-23 SURGICAL SUPPLY — 45 items
BLOCK BITE MAXI 60FR LF STRD STRAP SDPRT (Procedure Accessories) ×1
BLOCK BITE OD60 FR STURDY STRAP SIDEPORT (Procedure Accessories) ×1
BLOCK BITE OD60 FR STURDY STRAP SIDEPORT DENTAL RETENTION RIM MAXI (Procedure Accessories) ×1 IMPLANT
CATHETER BALLOON DILATATION CRE 2.8 MM (Balloons)
CATHETER BALLOON DILATATION CRE PEBAX (Balloons)
CATHETER OD10-11-12 MM ODSEC6 FR L180 CM CRE BALLOON DILATATION L8 CM (Balloons) IMPLANT
CATHETER OD10-11-12 MM ODSEC6 FR L180 CM CREâ„¢ BALLOON DILATATION L8 CM (Balloons) IMPLANT
CATHETER OD15-16.5-18 MM ODSEC6 FR L180 CM CRE BALLOON DILATATION L8 (Balloons) IMPLANT
CATHETER OD15-16.5-18 MM ODSEC6 FR L180 CM CREâ„¢ BALLOON DILATATION L8 (Balloons) IMPLANT
CATHETER OD6 FR ODSEC12-13.5-15 MM L180 CM CRE BALLOON DILATATION L8 (Balloons) IMPLANT
CATHETER OD6 FR ODSEC12-13.5-15 MM L180 CM CREâ„¢ BALLOON DILATATION L8 (Balloons) IMPLANT
CATHETER OD6-7-8 MM ODSEC6 FR L180 CM CRE BALLOON DILATATION L8 CM (Balloons) IMPLANT
CATHETER OD6-7-8 MM ODSEC6 FR L180 CM CREâ„¢ BALLOON DILATATION L8 CM (Balloons) IMPLANT
DILATOR ESCP PEBAX 2.8MM CRE 10-11-12MM (Balloons)
DILATOR ESCP PEBAX 2.8MM CRE 15-16.5-18 (Balloons)
DILATOR ESCP PEBAX 2.8MM CRE 6-7-8MM 6FR (Balloons)
DILATOR ESCP PEBAX CRE 6FR 12-13.5-15MM (Balloons)
ELECTRODE ADULT PATIENT RETURN L9 FT REM POLYHESIVE ACRYLIC FOAM (Procedure Accessories) IMPLANT
ELECTRODE PATIENT RETURN L9 FT VALLEYLAB (Procedure Accessories)
ELECTRODE PT RTN RM PHSV ACRL FM C30- LB (Procedure Accessories)
FORCEPS BIOPSY L240 CM LARGE CAPACITY (Instrument) ×1
FORCEPS BIOPSY L240 CM MICROMESH TEETH STREAMLINE CATHETER NEEDLE (Instrument) ×1 IMPLANT
FORCEPS BX SS LG CPC RJ 4 2.4MM 240CM (Instrument) ×1
GLOVE EXAM LARGE NITRILE CHEMOTHERAPY POWDER FREE SENSE OATMEAL (Glove) ×2 IMPLANT
GLOVE EXAM LARGE NITRILE POWDER FREE SENSE OATMEAL (Glove) ×2 IMPLANT
GLOVE EXAM NITRILE RESTORE LG (Glove) ×2
GLV EXAM NITRILE RESTORE LG (Glove) ×4
GOWN ISL PP PE REG LG LF FULL BCK NK TIE (Gown) ×4
GOWN ISOLATION REGULAR LARGE FULL BACK NECK TIE ELASTIC CUFF (Gown) ×2 IMPLANT
NEEDLE CARR-LOCKE INJECT 25GX5 (Needles) IMPLANT
PROBE ELECTROSURGICAL L220 CM FLEXIBLE (Procedure Accessories)
PROBE ELECTROSURGICAL L220 CM FLEXIBLE STRAIGHT FIRE OD2.3 MM FIAPC (Procedure Accessories) IMPLANT
PROBE ESURG FIAPC 2.3MM 220CM STRL FLXB (Procedure Accessories)
SNARE ESCP MIC CPTVTR 13MM 240IN STRL (GE Lab Supplies)
SNARE SMALL HEXAGON CAPTIVATOR STIFF ENDOSCOPIC POLYPECTOMY (GE Lab Supplies) IMPLANT
SPONGE GAUZE L4 IN X W4 IN 16 PLY (Dressing) ×1
SPONGE GAUZE L4 IN X W4 IN 16 PLY MAXIMUM ABSORBENT USP TYPE VII (Dressing) ×1 IMPLANT
SPONGE GZE CTTN CRTY 4X4IN LF NS 16 PLY (Dressing) ×1
SYRINGE 50 ML GRADUATE NONPYROGENIC DEHP (Syringes, Needles)
SYRINGE 50 ML GRADUATE NONPYROGENIC DEHP FREE PVC FREE BD MEDICAL (Syringes, Needles) IMPLANT
SYRINGE INFL 60ML ALN II STRL GA DISP (Syringes, Needles)
SYRINGE INFLATION 60 ML GAUGE CRE (Syringes, Needles) IMPLANT
SYRINGE MED 50ML LF STRL GRAD N-PYRG (Syringes, Needles)
WATER STERILE PLASTIC POUR BOTTLE 250 ML (Irrigation Solutions) ×1 IMPLANT
WATER STRL 250ML LF PLS PR BTL (Irrigation Solutions) ×1

## 2018-01-23 NOTE — Transfer of Care (Signed)
Anesthesia Transfer of Care Note    Patient: Bridget [name]    Procedures performed: Procedure(s) with comments:  EGD - EGD  Q1=N/A    Anesthesia type: General TIVA    Patient location:Phase II PACU    Last vitals:   Vitals:    01/23/18 1358   BP: 108/72   Pulse: (!) 106   Resp: 14   Temp: 36.9 C (98.5 F)   SpO2: 94%       Post pain: Patient not complaining of pain, continue current therapy      Mental Status:awake    Respiratory Function: tolerating room air    Cardiovascular: stable    Nausea/Vomiting: patient not complaining of nausea or vomiting    Hydration Status: adequate    Post assessment: no apparent anesthetic complications   P/t VSS, spont breathing, taken to phase 2 pacu on RA, attached to monitors in pacu, report given to pacu RN, VSS and spont breathing in pacu.    Signed by: Lelynd Poer Reather Converse Yareli Carthen  01/23/18 3:07 PM

## 2018-01-23 NOTE — H&P (Signed)
GI PRE PROCEDURE NOTE    Proceduralist Comments:   Review of Systems and Past Medical / Surgical History performed: Yes     Indications:Nausea and Vomiting    Previous Adverse Reaction to Anesthesia or Sedation (if yes, describe): No    Physical Exam / Laboratory Data (If applicable)   Airway Classification: Class II    General: Alert and cooperative  Lungs: Lungs clear to auscultation  Cardiac: RRR, normal S1S2.    Abdomen: Soft, non tender. Normal active bowel sounds  Other:     No labs drawn    American Society of Anesthesiologists (ASA) Physical Status Classification:   ASA 2 - Patient with mild systemic disease with no functional limitations    Planned Sedation:   Deep sedation with anesthesia    Attestation:   Bridget [name] has been reassessed immediately prior to the procedure and is an appropriate candidate for the planned sedation and procedure. Risks, benefits and alternatives to the planned procedure and sedation have been explained to the patient or guardian:  yes        Signed by: Jacqulyn Cane

## 2018-01-23 NOTE — Discharge Instructions (Signed)
Endoscopy Discharge Instructions  General Instructions:  1. Following sedation, your judgement, perception, and coordination are considered impaired. Even though you may feel awake and alert, you are considered legally intoxicated. Therefore, until the next morning;   Do not Drive   Do not operate appliances or equipment that requires reaction time (e.g. stove, electrical tools, machinery)   Do not sign legal documents or be involved in important decisions.   Do not smoke if alone   Do not drink alcoholic beverages   Go directly home and rest for several hours before resuming your routine activities.   It is highly recommended to have a responsible adult stay with you for the next 24 hours    2. Tenderness, swelling or pain may occur at the IV site where you received sedation. If you experience this, apply warm soaks to the area. Notify your physician if this persists.    Instructions Specific To Procedures - Report To Physician Any Of The Following:    Upper Endoscopy   1. Pain in Chest   2. Nausea/vomitting   3. Fevers/Chills within 24 hours after procedure. Temp>101deg F   4. Severe and persistent abdominal pain and bloating     In Addition:   Mild throat soreness may follow this procedure. Warm salt water gargling or    lozenges of your choice will most likely relieve your discomfort or cold drinks and   popsicles.     Additional Discharge Instructions  Your diet after the procedure: Avoid spicy, greasy or fried for the remainder of the day. Nothing red colored to eat or drink for 24 hours.   Special Instructions: none  Prescriptions given: No  Patient education literature given; Yes      If you have questions or problems contact your MD immediately. If you need immediate attention, call your MD, 911 and/or go to nearest emergency room.

## 2018-01-23 NOTE — Anesthesia Postprocedure Evaluation (Signed)
Anesthesia Post Evaluation    Patient: Bridget [name]    Procedure(s) with comments:  EGD - EGD  Q1=N/A    Anesthesia type: general    Last Vitals:   Vitals:    01/23/18 1530   BP: 95/63   Pulse: 83   Resp:    Temp:    SpO2: 99%       Anesthesia Post Evaluation:                               Comments: Anesthesia Post Evaluation    Laymond Purser    Procedures performed: Procedure(s) with comments:  Procedure(s) with comments:  EGD - EGDQ1=N/A      Anesthesia type: TIVA  Patient location:Phase II PACU  --------------------            01/23/18               1530     --------------------   BP:       95/63      Pulse:      83       Resp:                Temp:                SpO2:      99%      --------------------        Post pain: Patient not complaining of pain, continue current therapy     Mental Status:awake    Respiratory Function: tolerating room air    Cardiovascular: stable    Nausea/Vomiting: patient not complaining of nausea or vomiting    Hydration Status: adequate    Post assessment: no apparent anesthetic complications              Signed by: Gaylyn Rong, 01/23/2018 3:43 PM

## 2018-01-23 NOTE — Anesthesia Preprocedure Evaluation (Signed)
Anesthesia Evaluation    AIRWAY    Mallampati: I    TM distance: >3 FB  Neck ROM: full  Mouth Opening:full   CARDIOVASCULAR    cardiovascular exam normal       DENTAL         PULMONARY    pulmonary exam normal     OTHER FINDINGS                  Relevant Problems   No relevant active problems               Anesthesia Plan    ASA 1     general               (Risk and benefits of anesthesia explained to patient. Questions answered, consent obtained.  )      intravenous induction   Detailed anesthesia plan: general IV            informed consent obtained                   Signed by: Shuntel Fishburn 01/23/18 2:18 PM

## 2018-01-24 ENCOUNTER — Other Ambulatory Visit: Payer: Self-pay | Admitting: Internal Medicine

## 2018-01-24 DIAGNOSIS — K3184 Gastroparesis: Secondary | ICD-10-CM

## 2018-02-06 ENCOUNTER — Ambulatory Visit: Payer: No Typology Code available for payment source

## 2018-04-14 ENCOUNTER — Ambulatory Visit (INDEPENDENT_AMBULATORY_CARE_PROVIDER_SITE_OTHER): Payer: No Typology Code available for payment source

## 2018-04-14 ENCOUNTER — Ambulatory Visit (INDEPENDENT_AMBULATORY_CARE_PROVIDER_SITE_OTHER): Payer: No Typology Code available for payment source | Admitting: Nurse Practitioner

## 2018-04-14 VITALS — BP 120/80 | HR 73 | Temp 99.0°F | Ht 64.0 in | Wt 111.0 lb

## 2018-04-14 DIAGNOSIS — S59901A Unspecified injury of right elbow, initial encounter: Secondary | ICD-10-CM

## 2018-04-14 DIAGNOSIS — W2209XA Striking against other stationary object, initial encounter: Secondary | ICD-10-CM

## 2018-04-14 NOTE — Patient Instructions (Signed)
Elbow Sprain    A sprain is a tearing of the ligaments that hold a joint together. This may take up to 6 weeks to fully heal, depending on how severe it is. Moderate to severe sprains are treated with a sling or splint. Minor sprains can be treated without any special support.  Home care  The following guidelines will help you care for your injury at home:   Keep your arm elevated to reduce pain and swelling. When sitting or lying down keep your arm above the level of your heart. You can do this by placing your arm on a pillow that rests on your chest or on a pillow at your side. This is most important during the first 2 days (48 hours) after injury.   Put an ice pack on the injured area. Do this for 20 minutes every 1 to 2 hours the first day. You can make an ice pack by wrapping a plastic bag of ice cubes in a thin towel. As the ice melts, be careful that the splint doesn't get wet. Continue using the ice pack 3 to 4 times a day for the next 2 days. Then use the ice pack as needed to ease pain and swelling.   If you were given a plaster or fiberglasssplint,leave it on as advised, or until you see your healthcare provider. Keep it dry at all times. Bathe with your splint out of the water. Protect it with a large plastic bag, rubber-banded at the top end. If a fiberglass splint gets wet, you can dry it with a hair dryer. Once the splint is removed, move your elbow through its full range of motion several times a day. This will prevent stiffness.   If you were given aslingonly, begin gradual range-of-motion exercises after the first few days, unless told otherwise. This will prevent stiffness in the elbow. Stop wearing the sling once the pain is better.   You may use acetaminophen or ibuprofen to control pain, unless another pain medicine was prescribed.If you have chronic liver or kidney disease, talk with your healthcare provider before using these medicines. Also talk with your provider if you've had a  stomach ulcer or gastrointestinal bleeding.  Follow-up care  Follow up with your doctor as directed.  Any X-rays you had today don't show any broken bones, breaks, or fractures. Sometimes fractures don't show up on the first X-ray. Bruises and sprains can sometimes hurt as much as a fracture. These injuries can take time to heal completely. If your symptoms don't improve or they get worse, talk with your healthcare provider. You may need a repeat X-ray or other tests.  When to seek medical advice  Call your healthcare provider right awayif any of these occur:   The plaster splint becomes wet or soft   The fiberglass splint remains wet for more than 24 hours   Bad odor from the splint or wound fluid stains the splint   Splint cracks   Tightness or pain in the elbow gets worse   Fingers become swollen, cold, blue, numb, or tingly   You are less able to move the elbow, hand or fingers   Area around splint becomes red, swollen, or irritated   Fever of 101F (38.3C) or higher, or as directed by your healthcare provider  Date Last Reviewed: 12/15/2015   2000-2019 [employer] [address], [place] [address]. All rights reserved. This information is not intended as a substitute for professional medical   care. Always follow your healthcare professional's instructions.

## 2018-04-14 NOTE — Progress Notes (Signed)
[facility]  PROGRESS NOTE     Patient: Bridget [name]   Date: 04/14/2018   MRN: [id number]       Bridget [name] is a 22 y.o. female      HISTORY     Chief Complaint   Patient presents with   . Elbow Pain     Onset 04/14/18, R elbow pain from slamming her elbow a wooden steps. Pt cant strech arm. OTC aleve.         Pt with c/o right elbow pain after hitting it on a hard surface one hour ago.  Pt reports decreased rom, no numbness or tingling.  Pt did not ice area or take any otc pain relief        Review of Systems   Constitutional: Positive for activity change (due to decreased rom of right arm). Negative for fatigue.   Musculoskeletal:        Right elbow pain, full rom but with extreme pain with extension,    Skin: Negative for color change and wound.   Neurological: Negative for weakness and numbness.   Hematological: Does not bruise/bleed easily.       History:  Past Medical History:   Diagnosis Date   . Abdominal pain    . Asthma     mild seasonal controlled   . Complication of anesthesia    . Post-operative nausea and vomiting    . POTS (postural orthostatic tachycardia syndrome)     [facility]/stable for many yrs   . Sinus trouble     seasonal       Past Surgical History:   Procedure Laterality Date   . EGD N/A 01/23/2018    Procedure: EGD;  Surgeon: Jacqulyn Cane, MD;  Location: [facility];  Service: Gastroenterology;  Laterality: N/A;  EGD  Q1=N/A   . TONSILLECTOMY     . WISDOM TOOTH EXTRACTION         Family History   Problem Relation Age of Onset   . Cancer Mother        Social History   Substance Use Topics   . Smoking status: Never Smoker   . Smokeless tobacco: Never Used   . Alcohol use No       History reviewed.        Current Outpatient Prescriptions:   .  metoclopramide (REGLAN) 10 MG tablet, Take 1 tablet (10 mg total) by mouth 4 (four) times daily, Disp: 30 tablet, Rfl: 0  .  Multiple Vitamin (MULTIVITAMIN) capsule, Take 1 capsule by mouth  daily, Disp: , Rfl:   .  NORTREL 1/35, 21, 1-35 MG-MCG per tablet, TAKE 1 TABLET BY MOUTH CONTINUOUSLY, Disp: , Rfl: 3    Allergies   Allergen Reactions   . Latex      Itching irritation swelling condoms       Medications and Allergies reviewed.    PHYSICAL EXAM     Vitals:    04/14/18 1917   BP: 120/80   Pulse: 73   Temp: 99 F (37.2 C)   TempSrc: Tympanic   Weight: 50.3 kg (111 lb)   Height: 1.626 m (5\' 4" )       Physical Exam   Constitutional: She is oriented to person, place, and time. She appears well-developed and well-nourished. No distress.   HENT:   Head: Normocephalic.   Eyes: Pupils are equal, round, and reactive to light.   Cardiovascular: Intact distal  pulses.    Musculoskeletal: She exhibits tenderness. She exhibits no edema or deformity.   Right elbow with pain, full rom but with pain when extending.  No edema, ecchymosis, erythema or deformity present   Neurological: She is alert and oriented to person, place, and time.   Skin: Skin is warm and dry. She is not diaphoretic. No erythema.   Psychiatric: She has a normal mood and affect. Her behavior is normal. Judgment and thought content normal.         UCC COURSE       Results     ** No results found for the last 24 hours. **            Xr Elbow Right Ap Lateral And Obliques    Result Date: 04/14/2018  XR ELBOW RIGHT AP LATERAL AND OBLIQUES CLINICAL INDICATION:   impact to right elbow, decreased rom COMPARISON: None FINDINGS:  3  views were obtained. There is no fracture or dislocation. The bony architecture is normal.  No soft tissue calcification is noted and no radiopaque foreign body is demonstrated.       No acute fracture or dislocation. [place]  Teferra, MD 04/14/2018 7:49 PM        No orders of the defined types were placed in this encounter.        PROCEDURES     Procedures       ASSESSMENT     Encounter Diagnosis   Name Primary?   . Elbow injury, right, initial encounter Yes          SSESSMENT    PLAN      X-ray results discussed with  patient.  Recommend rest, ice, elevation, and motrin for discomfort.  If no improvement after 2 weeks f/u with ortho    Discussed results and diagnosis with patient/family.  Reviewed warning signs for worsening condition, as well as, indications for follow-up with pmd and return to urgent care clinic.   Patient/family expressed understanding of instructions.    Orders Placed This Encounter   Procedures   . XR Elbow Right AP Lateral And Obliques         An After Visit Summary was printed and given to the patient.      Signed,  Leonarda Salon, DNP FNP

## 2018-05-15 ENCOUNTER — Other Ambulatory Visit (INDEPENDENT_AMBULATORY_CARE_PROVIDER_SITE_OTHER): Payer: Self-pay | Admitting: Physician Assistant

## 2018-07-03 ENCOUNTER — Other Ambulatory Visit (INDEPENDENT_AMBULATORY_CARE_PROVIDER_SITE_OTHER): Payer: Self-pay | Admitting: Physician Assistant

## 2018-10-19 ENCOUNTER — Other Ambulatory Visit (INDEPENDENT_AMBULATORY_CARE_PROVIDER_SITE_OTHER): Payer: Self-pay | Admitting: Family Medicine

## 2018-10-20 DIAGNOSIS — K30 Functional dyspepsia: Secondary | ICD-10-CM | POA: Insufficient documentation

## 2018-10-20 DIAGNOSIS — N809 Endometriosis, unspecified: Secondary | ICD-10-CM | POA: Insufficient documentation

## 2018-10-20 DIAGNOSIS — I498 Other specified cardiac arrhythmias: Secondary | ICD-10-CM | POA: Insufficient documentation

## 2018-10-20 DIAGNOSIS — G90A Postural orthostatic tachycardia syndrome (POTS): Secondary | ICD-10-CM | POA: Insufficient documentation

## 2018-12-21 DIAGNOSIS — Z720 Tobacco use: Secondary | ICD-10-CM | POA: Insufficient documentation

## 2019-02-13 ENCOUNTER — Telehealth (INDEPENDENT_AMBULATORY_CARE_PROVIDER_SITE_OTHER): Payer: No Typology Code available for payment source | Admitting: Family Medicine

## 2019-02-13 ENCOUNTER — Encounter (INDEPENDENT_AMBULATORY_CARE_PROVIDER_SITE_OTHER): Payer: Self-pay | Admitting: Family Medicine

## 2019-02-13 VITALS — Temp 98.4°F | Ht 63.5 in | Wt 112.0 lb

## 2019-02-13 DIAGNOSIS — J3489 Other specified disorders of nose and nasal sinuses: Secondary | ICD-10-CM

## 2019-02-13 DIAGNOSIS — J302 Other seasonal allergic rhinitis: Secondary | ICD-10-CM

## 2019-02-13 NOTE — Patient Instructions (Signed)
Allergic Rhinitis  Allergic rhinitis is an allergic reaction that affects the nose, and often the eyes. It's often known asnasal allergies. Nasal allergies are often due to things in the environment that are breathed in. Depending what you are sensitive to, nasal allergies may occur only during certain seasons, or they may occur year round. Common indoor allergens include house dust mites, mold, cockroaches, and pet dander. Outdoor allergens include pollen from trees, grasses, and weeds.   Symptoms include a drippy, stuffy, and itchy nose. They also include sneezing and red and itchy eyes. You may feel tired more often. Severe allergies may also affect your breathing and trigger a condition called asthma.   Tests can be done to see what allergens are affecting you. You may be referred to an allergy specialist for testing and further evaluation.   Home care  Your healthcare provider may prescribe medicines to help relieve allergy symptoms. These may include oral medicines, nasal sprays, or eye drops.   Ask your provider for advice on how to stay away from substances that you are allergic to.Below are a few tips for each type of allergen.   Pet dander:   Do not have pets with fur and feathers.   If you have a pet, keep it out of your bedroom and off upholstered furniture.  Pollen:   When pollen counts are high, keep windows of your car and home closed. If possible, use an air conditioner instead.   Wear a filter mask when mowing or doing yard work.  House dust mites:   Wash bedding every week in warm water and detergent and dry on a hot setting.   Cover the mattress, box spring, and pillows with allergy covers.   If possible, sleep in a room with no carpet, curtains, or upholstered furniture.  Cockroaches:   Store food in sealed containers.   Remove garbage from the home promptly.   Fix water leaks.  Mold:   Keep humidity low by using a dehumidifier or air conditioner. Keep the dehumidifier and air  conditioner clean and free of mold.   Clean moldy areas with bleach and water. Don't mix bleach with other cleaners.  In general:   Vacuum once or twice a week. If possible, use a vacuum with a high-efficiency particulate air (HEPA) filter.   Don't smoke. Stay away from cigarette smoke. Cigarette smoke is an irritant that can make symptoms worse.  Follow-up care  Follow up as advised by the healthcare provider or our staff. If you were referred to an allergy specialist, make this appointment promptly.   When to seek medical advice  Call your healthcare provider or get medical care right away if the following occur:    Coughing   Fever of 100.4F (38C) or higher, or as directed by your healthcare provider   Raised red bumps (hives)   Continuing symptoms, new symptoms, or worsening symptoms  Call 911  Call 911if you have:    Trouble breathing   Severe swelling of the face or severe itching of the eyes or mouth   Wheezing or shortness of breath   Chest tightness   Dizziness or lightheadedness   Feeling of doom   Stomach pain, bloating, vomiting, or diarrhea  [employer] last reviewed this educational content on 05/16/2018   2000-2020 [employer] [address], [place] [address]. All rights reserved. This information is not intended as a substitute for professional medical care. Always follow your healthcare professional's instructions.

## 2019-02-13 NOTE — Progress Notes (Signed)
[facility] [place] [facility]                       Date of Virtual Visit: 02/13/2019 11:17 AM        Patient ID: Bridget [name] is a 23 y.o. female.  Attending Physician: Reynold Bowen, MD       Telemedicine Eligibility:    State Location:  [x]  [place]  []  [place]  []  [place] []  [place]  []  Other:    Physical Location:  [x]  Home  []         []        []          []  Other:    Patient Identity Verification:  [x]  State Issued ID  []  Insurance Eligibility Check  []  Other:    Physical Address Verification: (for 911)  [x]  Yes  []  No    Personal identity shared with patient:  []  Yes  []  No    Education on nature of video visit shared with patient:  []  Yes  []  No    Emergency plan agreed upon with patient:  [x]  Yes  []  No    If the patient had not had this virtual visit, what would they have done?  []         []         []        []          []  Other:    Visit terminated since not appropriate for virtual care:  []  N/A  []  Reason:         Chief Complaint:    Sinusitis (x 1 wk)               HPI:    Due to current pandemic of COVID 19, patient being seen through a virtual visit to minimize infectious disease risk to themselves and our medical office staff.    Pt endorses sinus pain and headache x 1 week. Endorses clear nasal drainage. Feels like pain and headache feel worse. Endorses nasal congestion, rhinorrhea, postnasal drainage, and bilateral ear fullness. Denies fevers, chills, diaphoresis, cough, sore throat. States her sinus infections typically occur after a URI and has green nasal discharge, but did not have that prior to this episode. Endorses allergy symptoms, but states that testing was negative for this; does not regularly use allergy medications. States that nasal saline rinses do not typically improve symptoms.              Problem List:    Patient Active Problem List   Diagnosis   . Abdominal pain, right lower quadrant   . Nausea alone             Current Meds:     Current Outpatient Medications   Medication Sig Dispense Refill   . metoclopramide (REGLAN) 10 MG tablet Take 1 tablet (10 mg total) by mouth 4 (four) times daily 30 tablet 0   . Multiple Vitamin (MULTIVITAMIN) capsule Take 1 capsule by mouth daily     . NORTREL 1/35, 21, 1-35 MG-MCG per tablet TAKE 1 TABLET BY MOUTH CONTINUOUSLY  3     No current facility-administered medications for this visit.           Allergies:    Allergies   Allergen Reactions   . Latex      Itching irritation swelling condoms  Past Surgical History:    Past Surgical History:   Procedure Laterality Date   . EGD N/A 01/23/2018    Procedure: EGD;  Surgeon: Jacqulyn Cane, MD;  Location: [facility];  Service: Gastroenterology;  Laterality: N/A;  EGD  Q1=N/A   . TONSILLECTOMY     . WISDOM TOOTH EXTRACTION             Family History:    Family History   Problem Relation Age of Onset   . Cancer Mother            Social History:    Social History     Tobacco Use   . Smoking status: Never Smoker   . Smokeless tobacco: Never Used   Substance Use Topics   . Alcohol use: No   . Drug use: Yes     Comment: "occasional pot"          The following sections were reviewed this encounter by the provider:   Tobacco  Allergies  Meds  Problems  Med Hx  Surg Hx  Fam Hx             Vital Signs:    Temp 98.4 F (36.9 C) (Oral) Comment (Src): stated  Ht 1.613 m (5' 3.5") Comment: stated  Wt 50.8 kg (112 lb) Comment: stated  BMI 19.53 kg/m          ROS:    Review of Systems per HPI          Physical Exam:    Physical Exam  Constitutional:       General: She is not in acute distress.     Appearance: Normal appearance. She is normal weight. She is not ill-appearing, toxic-appearing or diaphoretic.   HENT:      Head: Normocephalic.   Eyes:      General: No scleral icterus.     Conjunctiva/sclera: Conjunctivae normal.   Pulmonary:      Effort: Pulmonary effort is normal. No respiratory distress.   Neurological:      Mental Status: She is  alert.   Psychiatric:         Mood and Affect: Mood normal.         Behavior: Behavior normal.         Thought Content: Thought content normal.         Judgment: Judgment normal.               Assessment/ Plan:    1. Sinus pain    2. Seasonal allergic rhinitis, unspecified trigger  Likely seasonal allergies; trial of OTC allergy medication, flonase, and nasal saline rinses.   If no improvement/ worsening of symptoms to call back for trial of abx.             Follow-up:    Return in about 4 weeks (around 03/13/2019) for Annual exam, 1st pap .         Reynold Bowen, MD

## 2019-03-16 ENCOUNTER — Telehealth (INDEPENDENT_AMBULATORY_CARE_PROVIDER_SITE_OTHER): Payer: No Typology Code available for payment source | Admitting: Family

## 2019-03-16 ENCOUNTER — Encounter (INDEPENDENT_AMBULATORY_CARE_PROVIDER_SITE_OTHER): Payer: Self-pay | Admitting: Family

## 2019-03-16 DIAGNOSIS — L089 Local infection of the skin and subcutaneous tissue, unspecified: Secondary | ICD-10-CM

## 2019-03-16 MED ORDER — CEPHALEXIN 500 MG PO CAPS
500.00 mg | ORAL_CAPSULE | Freq: Two times a day (BID) | ORAL | 0 refills | Status: AC
Start: 2019-03-16 — End: 2019-03-26

## 2019-03-16 NOTE — Progress Notes (Signed)
[facility] [place] [facility]                       Date of Virtual Visit: 03/16/2019 3:50 PM        Patient ID: Bridget [name] is a 23 y.o. female.  Attending Physician: Levy Sjogren, NP       Telemedicine Eligibility:      Do you give [place] permission to submit this claim to your insurance:    [x]  Yes   []  No    State Location:  [x]  [place]  []  [place]  []  [address] [place] []  [place]  []  Other:    Physical Location:  [x]  Home  []         []        []          []  Other:    Patient Identity Verification:  [x]  State Issued ID  []  Insurance Eligibility Check  []  Other:    Physical Address Verification: (for 911)  [x]  Yes  []  No    Personal identity shared with patient:  [x]  Yes  []  No    Education on nature of video visit shared with patient:  [x]  Yes  []  No    Emergency plan agreed upon with patient:  [x]  Yes  []  No    If the patient had not had this virtual visit, what would they have done?  []         []         []        []          [x]  Other:    Visit terminated since not appropriate for virtual care:  [x]  N/A  []  Reason:               Chief Complaint:    Rash (Poss infection on Lt pinky)               HPI:    Due to current pandemic of COVID 19, patient being seen through a virtual visit to minimize infectious disease risk to themselves and our medical office staff.    Due to current pandemic of COVID 19, patient being seen through a virtual visit to minimize infectious disease risk to themselves and our medical office staff.      Pt reports that in between pinky and 4th tow of Lt foot (mostly on pinky.)   Some white, some blisters with pus. Reports it has been about 5 days since she noticed what started as a "rash"  tx like athletes foot but has worsened since starting to use the spray..   Not itchy, just painful            Problem List:    Patient Active Problem List   Diagnosis   . Abdominal pain, right lower quadrant   . Nausea and vomiting   . Chronic urinary tract  infection   . Asthma   . Delayed gastric emptying   . Ehlers-Danlos syndrome   . Endometriosis   . Postural orthostatic tachycardia syndrome   . Tobacco user             Current Meds:    Current Outpatient Medications   Medication Sig Dispense Refill   . norethindrone-ethinyl estradiol (ORTHO-NOVUM 1/35) 1-35 MG-MCG per tablet Take 1 tablet by mouth     . albuterol (ProAir HFA) 108 (90 Base) MCG/ACT inhaler ProAir HFA 90 mcg/actuation aerosol inhaler     .  cephalexin (KEFLEX) 500 MG capsule Take 1 capsule (500 mg total) by mouth 2 (two) times daily for 10 days 20 capsule 0   . fluticasone (Flovent HFA) 110 MCG/ACT inhaler Flovent HFA 110 mcg/actuation aerosol inhaler     . metoclopramide (REGLAN) 10 MG tablet Take 1 tablet (10 mg total) by mouth 4 (four) times daily 30 tablet 0   . ondansetron (ZOFRAN-ODT) 4 MG disintegrating tablet PLACE ONE TABLET UNDER THE TONGUE EVERY 6 TO 8 HOURS     . promethazine (PHENERGAN) 12.5 MG suppository PLACE 1 SUPPOSITORY RECTALLY EVERY 6 HOURS AS NEEDED FOR NAUSEA  0     No current facility-administered medications for this visit.           Allergies:    Allergies   Allergen Reactions   . Latex Itching     Itching irritation swelling condoms  Itching irritation swelling condoms  Itching irritation swelling condoms             Past Surgical History:    Past Surgical History:   Procedure Laterality Date   . EGD N/A 01/23/2018    Procedure: EGD;  Surgeon: Jacqulyn Cane, MD;  Location: [facility];  Service: Gastroenterology;  Laterality: N/A;  EGD  Q1=N/A   . TONSILLECTOMY     . WISDOM TOOTH EXTRACTION             Family History:    Family History   Problem Relation Age of Onset   . Cancer Mother            Social History:    Social History     Tobacco Use   . Smoking status: Never Smoker   . Smokeless tobacco: Never Used   Substance Use Topics   . Alcohol use: No   . Drug use: Yes     Comment: "occasional pot"          The following sections were reviewed this encounter by  the provider:   Tobacco  Allergies  Meds  Problems  Med Hx  Surg Hx  Fam Hx             Vital Signs:    There were no vitals taken for this visit.         ROS:    Review of Systems   Skin: Positive for wound.   All other systems reviewed and are negative.             Physical Exam:    Physical Exam  Constitutional:       Appearance: Normal appearance. She is normal weight.   Musculoskeletal: Normal range of motion.   Skin:     Comments: Left foot: area between 4th and 5th toe skin is macerated and has surrounding area of erythema. No edema. No red streaking.   Neurological:      Mental Status: She is alert.        GENERAL APPEARANCE: alert, in no acute distress, pleasant, well nourished.   PSYCH: appropriate affect, appropriate mood, normal speech, normal attention        Assessment:    1. Left foot infection  - cephalexin (KEFLEX) 500 MG capsule; Take 1 capsule (500 mg total) by mouth 2 (two) times daily for 10 days  Dispense: 20 capsule; Refill: 0            Plan:      Take oral antibiotic as ordered.  Keep the are clean and as dry  as possible.  Apply antibiotic cream 2 to 3 times a day.          Follow-up:    Return if symptoms worsen or fail to improve.         Levy Sjogren, NP

## 2019-03-19 ENCOUNTER — Telehealth (INDEPENDENT_AMBULATORY_CARE_PROVIDER_SITE_OTHER): Payer: No Typology Code available for payment source | Admitting: Family Medicine

## 2019-03-19 ENCOUNTER — Encounter (INDEPENDENT_AMBULATORY_CARE_PROVIDER_SITE_OTHER): Payer: Self-pay | Admitting: Family Medicine

## 2019-03-19 DIAGNOSIS — L03032 Cellulitis of left toe: Secondary | ICD-10-CM

## 2019-03-19 MED ORDER — SULFAMETHOXAZOLE-TRIMETHOPRIM 800-160 MG PO TABS
1.00 | ORAL_TABLET | Freq: Two times a day (BID) | ORAL | 0 refills | Status: AC
Start: 2019-03-19 — End: 2019-03-26

## 2019-03-19 NOTE — Progress Notes (Signed)
[facility] [place] [facility]                       Date of Virtual Visit: 03/19/2019 5:34 PM        Patient ID: Bridget [name] is a 23 y.o. female.  Attending Physician: Reynold Bowen, MD       Telemedicine Eligibility:      Do you give [place] permission to submit this claim to your insurance:    [x]  Yes   []  No    State Location:  [x]  [place]  []  [place]  []  [address] [place] []  [place]  []  Other:    Physical Location:  [x]  Home  []         []        []          []  Other:    Patient Identity Verification:  [x]  State Issued ID  []  Insurance Eligibility Check  []  Other:    Physical Address Verification: (for 911)  [x]  Yes  []  No    Personal identity shared with patient:  [x]  Yes  []  No    Education on nature of video visit shared with patient:  [x]  Yes  []  No    Emergency plan agreed upon with patient:  []  Yes  []  No    If the patient had not had this virtual visit, what would they have done?  []         []         []        []          []  Other:    Visit terminated since not appropriate for virtual care:  []  N/A  []  Reason:       Chief Complaint:    Rash (Infection on L foot x 1 week)               HPI:    Due to current pandemic of COVID 19, patient being seen through a virtual visit to minimize infectious disease risk to themselves and our medical office staff.    Pt presents for worsening cellulitis.     Symptoms started about 1 week ago with some erythema and pruritus in the crease between the 4th and 5th digits of L foot. Thought it was athletes foot, so tried anti-fungal spray, but symptoms worsened and developed blisters in that area with tenderness.     Seen by [facility] 7/31 and started on Keflex. States that symptoms have worsened; endorsed increased tenderness and spreading erythema (from between digits . Endorses pain with ambulation now.     Denies fevers, chills, diaphoresis, nausea. Denies known trauma to area. Still with unpopped blisters, which appear to contain white pus. Has  been applying neosporin. Denies joint pain.             Problem List:    Patient Active Problem List   Diagnosis   . Chronic urinary tract infection   . Asthma   . Delayed gastric emptying   . Ehlers-Danlos syndrome   . Endometriosis   . Postural orthostatic tachycardia syndrome             Current Meds:    Current Outpatient Medications   Medication Sig Dispense Refill   . albuterol (ProAir HFA) 108 (90 Base) MCG/ACT inhaler ProAir HFA 90 mcg/actuation aerosol inhaler     . cephalexin (KEFLEX) 500 MG capsule Take 1 capsule (500 mg total) by mouth  2 (two) times daily for 10 days 20 capsule 0   . Fexofenadine HCl (ALLEGRA PO) Take by mouth     . norethindrone-ethinyl estradiol (ORTHO-NOVUM 1/35) 1-35 MG-MCG per tablet Take 1 tablet by mouth     . ondansetron (ZOFRAN-ODT) 4 MG disintegrating tablet PLACE ONE TABLET UNDER THE TONGUE EVERY 6 TO 8 HOURS     . fluticasone (Flovent HFA) 110 MCG/ACT inhaler Flovent HFA 110 mcg/actuation aerosol inhaler     . metoclopramide (REGLAN) 10 MG tablet Take 1 tablet (10 mg total) by mouth 4 (four) times daily 30 tablet 0   . promethazine (PHENERGAN) 12.5 MG suppository PLACE 1 SUPPOSITORY RECTALLY EVERY 6 HOURS AS NEEDED FOR NAUSEA  0     No current facility-administered medications for this visit.           Allergies:    Allergies   Allergen Reactions   . Latex Itching     Itching irritation swelling condoms  Itching irritation swelling condoms  Itching irritation swelling condoms             Past Surgical History:    Past Surgical History:   Procedure Laterality Date   . EGD N/A 01/23/2018    Procedure: EGD;  Surgeon: Jacqulyn Cane, MD;  Location: [facility];  Service: Gastroenterology;  Laterality: N/A;  EGD  Q1=N/A   . TONSILLECTOMY     . WISDOM TOOTH EXTRACTION             Family History:    Family History   Problem Relation Age of Onset   . Cancer Mother            Social History:    Social History     Tobacco Use   . Smoking status: Never Smoker   . Smokeless  tobacco: Never Used   Substance Use Topics   . Alcohol use: No   . Drug use: Yes     Comment: "occasional pot"          The following sections were reviewed this encounter by the provider:   Tobacco  Allergies  Meds  Problems  Med Hx  Surg Hx  Fam Hx             Vital Signs:    There were no vitals taken for this visit.         ROS:    Review of Systems per HPI          Physical Exam:    Physical Exam  Constitutional:       General: She is not in acute distress.     Appearance: Normal appearance. She is normal weight. She is not ill-appearing, toxic-appearing or diaphoretic.   HENT:      Head: Normocephalic.   Eyes:      General: No scleral icterus.     Conjunctiva/sclera: Conjunctivae normal.   Pulmonary:      Effort: Pulmonary effort is normal. No respiratory distress.   Skin:     Comments: Erythema and tenderness of 5th digit of L foot. Appears to have an open blister without active discharge or bleeding.    Neurological:      Mental Status: She is alert.   Psychiatric:         Mood and Affect: Mood normal.         Behavior: Behavior normal.         Thought Content: Thought content normal.  Judgment: Judgment normal.                    Assessment/ plan:    1. Cellulitis of toe of left foot  - stop keflex and trial of Bactrim. If no improvement or worsening, can send to podiatry               Follow-up:    Return if symptoms worsen or fail to improve.         Reynold Bowen, MD

## 2019-03-19 NOTE — Patient Instructions (Signed)
Cellulitis  Cellulitis is an infection of the deep layers of skin. A break in the skin, such as a cut or scratch, can let bacteria under the skin. If the bacteria get to deep layers of the skin, it can be serious. If not treated, cellulitis can get into the bloodstream and lymph nodes. The infection can then spread throughout the body. This causes serious illness.   Cellulitis causes the affected skin to become red, swollen, warm, and sore. The reddened areas have a visible border. An open sore may leak fluid (pus). You may have a fever, chills, and pain.   Cellulitis is treated with antibiotics taken for 7 to 10 days. An open sore may be cleaned and covered with cool wet gauze. Symptoms should get better 1 to 2 days after treatment is started. Make sure to take all the antibiotics for the full number of days until they are gone. Keep taking the medicine even if your symptoms go away.   Home care  Follow these tips:   Limit the use of the part of your body with cellulitis.   If the infection is on your leg, keep your leg raised while sitting. This helps reduce swelling.   Take all of the antibiotic medicine exactly as directed until it is gone. Don't miss any doses, especially during the first 7 days. Don't stop taking the medicine when your symptoms get better.   Keep the affected area clean and dry.   Wash your hands with soap and clean, running water before and after touching your skin. Anyone else who touches your skin should also wash his or her hands. Don't share towels.  Follow-up care  Follow up with your healthcare provider, or as advised. If your infection doesn't go away on the first antibiotic, your healthcare provider will prescribe a different one.   When to seek medical advice  Call your healthcare provider right away if any of these occur:   Red areas that spread   Swelling or pain that gets worse   Fluid leaking from the skin (pus)   Fever higher of 100.4 F (38.0 C) or higher after 2  days on antibiotics  [employer] last reviewed this educational content on 03/16/2018   2000-2020 [employer] [address], [place] [address]. All rights reserved. This information is not intended as a substitute for professional medical care. Always follow your healthcare professional's instructions.

## 2019-03-21 ENCOUNTER — Telehealth (INDEPENDENT_AMBULATORY_CARE_PROVIDER_SITE_OTHER): Payer: No Typology Code available for payment source | Admitting: Family Medicine

## 2019-03-21 DIAGNOSIS — L089 Local infection of the skin and subcutaneous tissue, unspecified: Secondary | ICD-10-CM

## 2019-03-21 LAB — NUSWAB VAGINITIS PLUS (VG+)
CHLAMYDIA TRACHOMATIS, NAA: NEGATIVE
CHLAMYDIA TRACHOMATIS, NAA: NEGATIVE
Candida albicans, NAA: NEGATIVE
Candida albicans, NAA: POSITIVE — AB
Candida glabrata, NAA: NEGATIVE
Candida glabrata, NAA: NEGATIVE
Neisseria gonorrhoeae, NAA: NEGATIVE
Neisseria gonorrhoeae, NAA: NEGATIVE
TRICH VAG BY NAA: NEGATIVE
TRICH VAG BY NAA: NEGATIVE

## 2019-03-21 LAB — URINE CULTURE

## 2019-03-21 NOTE — Addendum Note (Signed)
Addended byDanella Penton, Kathyann Spaugh N on: 03/21/2019 06:27 PM     Modules accepted: Orders

## 2019-03-21 NOTE — Progress Notes (Signed)
Called and spoke with pt. Will place referral to podiatry. Pt agreeable.

## 2019-03-22 ENCOUNTER — Telehealth (INDEPENDENT_AMBULATORY_CARE_PROVIDER_SITE_OTHER): Payer: Self-pay | Admitting: Family Medicine

## 2019-03-22 NOTE — Telephone Encounter (Signed)
Pt scheduled today @ 11:45 PM with Dr. Abran Richard Said/ [facility]  Faxed order to [phone] - called spoke to pt to confirm appt- closing task- [facility]

## 2019-03-27 ENCOUNTER — Encounter (INDEPENDENT_AMBULATORY_CARE_PROVIDER_SITE_OTHER): Payer: Self-pay | Admitting: Family Medicine

## 2019-03-27 ENCOUNTER — Telehealth (INDEPENDENT_AMBULATORY_CARE_PROVIDER_SITE_OTHER): Payer: Self-pay | Admitting: Family Medicine

## 2019-09-17 ENCOUNTER — Telehealth (INDEPENDENT_AMBULATORY_CARE_PROVIDER_SITE_OTHER): Payer: No Typology Code available for payment source | Admitting: Family Medicine

## 2019-09-17 ENCOUNTER — Encounter (INDEPENDENT_AMBULATORY_CARE_PROVIDER_SITE_OTHER): Payer: Self-pay

## 2019-09-17 ENCOUNTER — Encounter (INDEPENDENT_AMBULATORY_CARE_PROVIDER_SITE_OTHER): Payer: Self-pay | Admitting: Family Medicine

## 2019-09-17 VITALS — Ht 64.0 in | Wt 112.0 lb

## 2019-09-17 DIAGNOSIS — Z3041 Encounter for surveillance of contraceptive pills: Secondary | ICD-10-CM

## 2019-09-17 DIAGNOSIS — N809 Endometriosis, unspecified: Secondary | ICD-10-CM

## 2019-09-17 IMAGING — CT CT ABD-PELV W/ CM
2 of 4 series · 15 of 46 positions shown, 17 images · IV contrast (APPLIED)
Comparison: None.

CLINICAL DATA: 21 y/o F; pelvic pain beginning 3 days ago with
nausea and vomiting. IUD placed 2 weeks ago. Currently being treated
for urinary tract infection.

EXAM:
CT ABDOMEN AND PELVIS WITH CONTRAST
TECHNIQUE: Multidetector CT imaging of the abdomen and pelvis was performed
using the standard protocol following bolus administration of
intravenous contrast.
CONTRAST:  75mL 8MVMSB-Y77 IOPAMIDOL (8MVMSB-Y77) INJECTION 61%

[Series 2: routine abd/pel with · axial · 0.63mm/px · z∈[-1089,-699]mm · 12 of 90 slices shown, 14 images]
[im 8/90  soft-tissue]
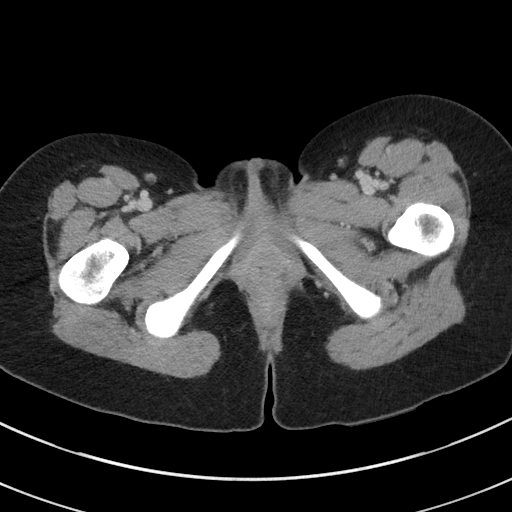
[im 8/90  bone]
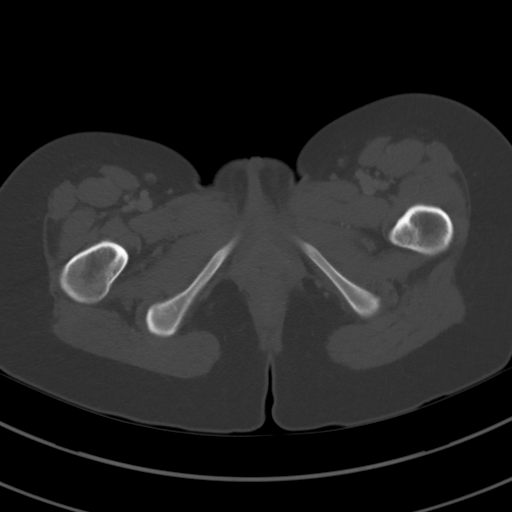
[im 15/90  soft-tissue]
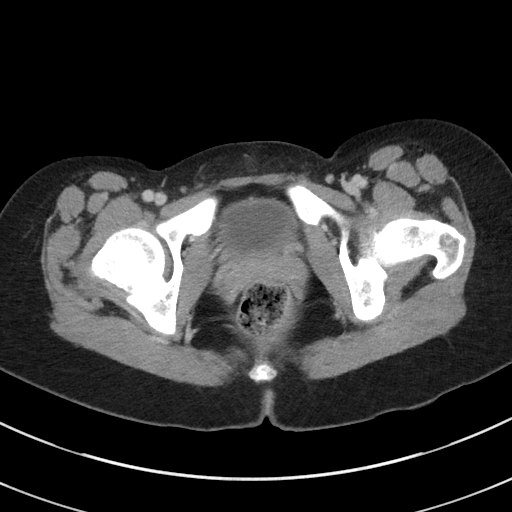
[im 22/90  soft-tissue]
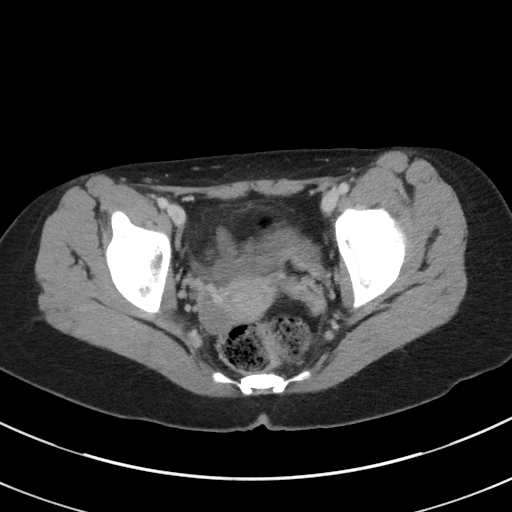
[im 29/90  soft-tissue]
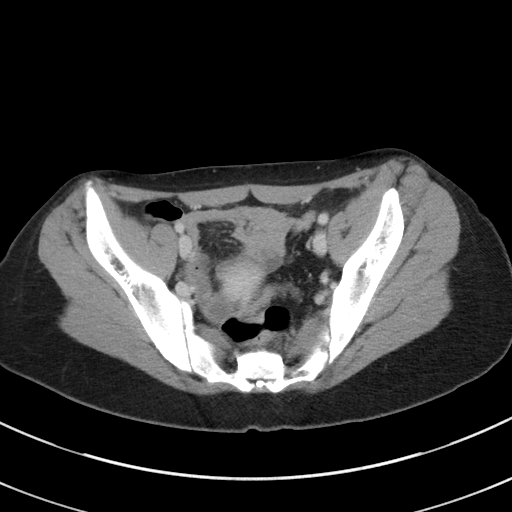
[im 36/90  soft-tissue]
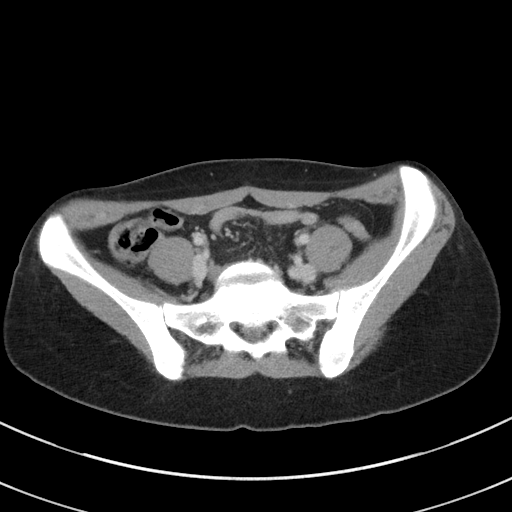
[im 43/90  soft-tissue]
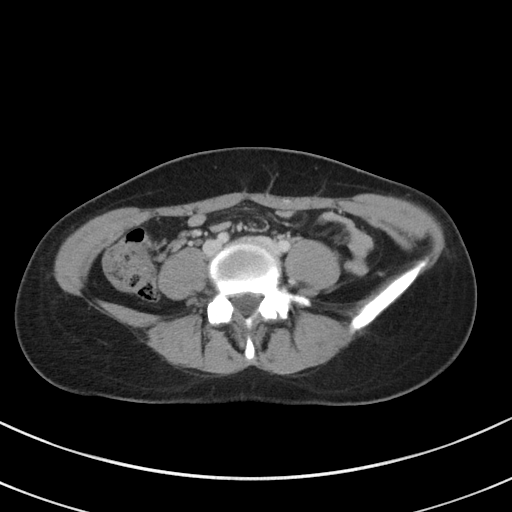
[im 50/90  soft-tissue]
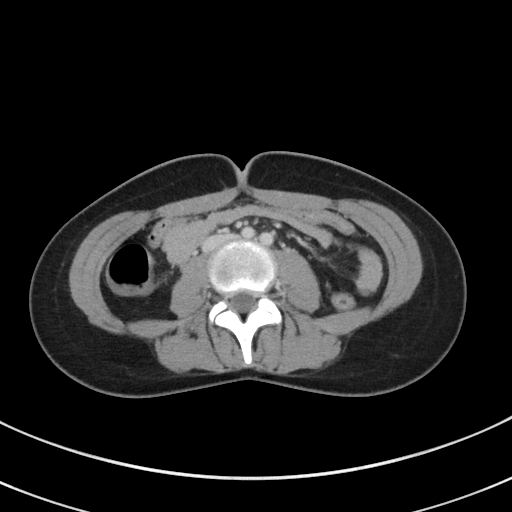
[im 57/90  soft-tissue]
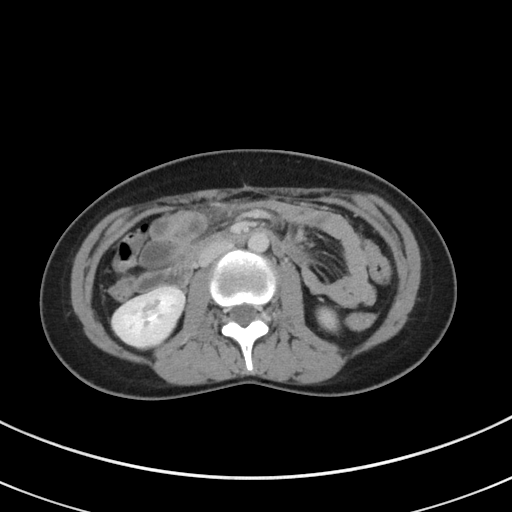
[im 65/90  soft-tissue]
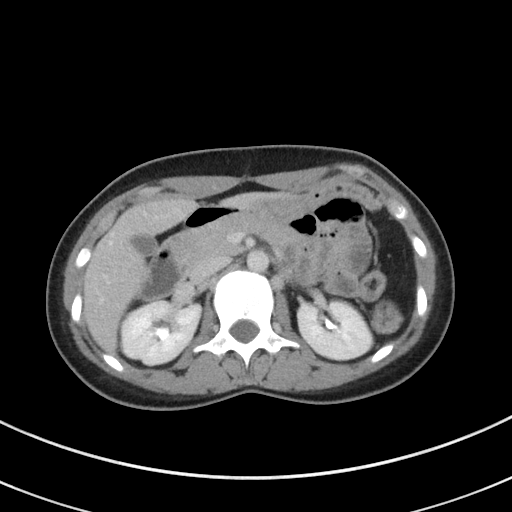
[im 65/90  bone]
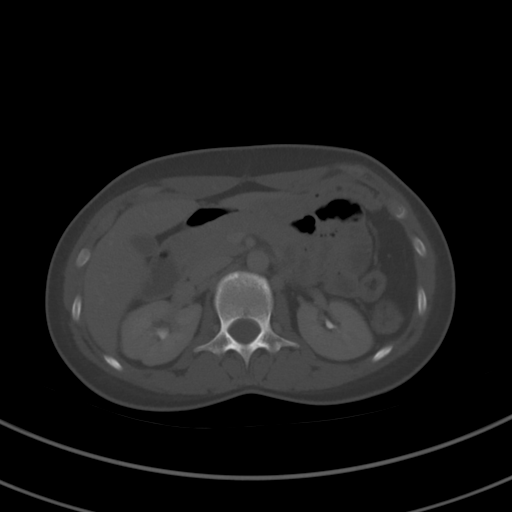
[im 72/90  soft-tissue]
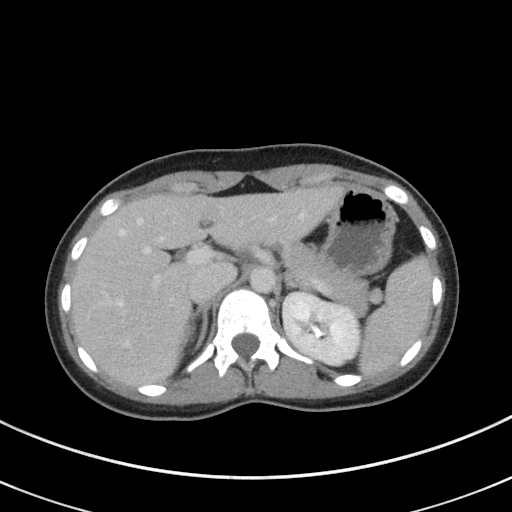
[im 79/90  soft-tissue]
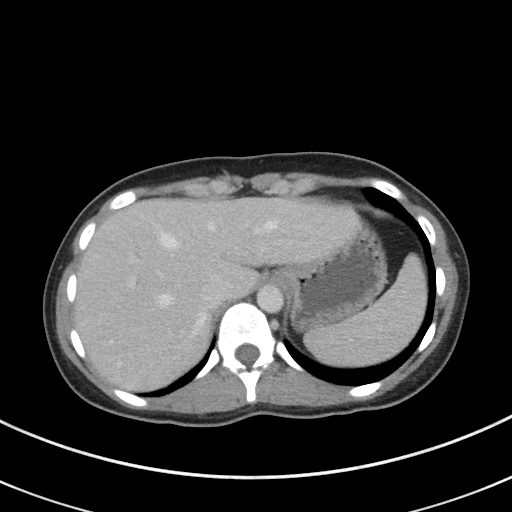
[im 86/90  soft-tissue]
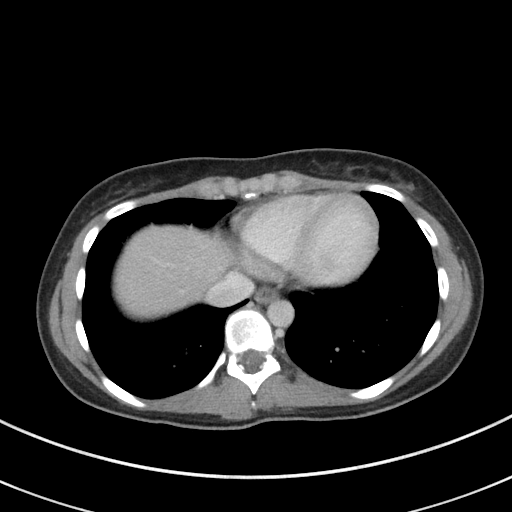

[Series 5: coronal st · coronal · 0.56mm/px · 3 of 54 slices shown]
[im 18/54  soft-tissue]
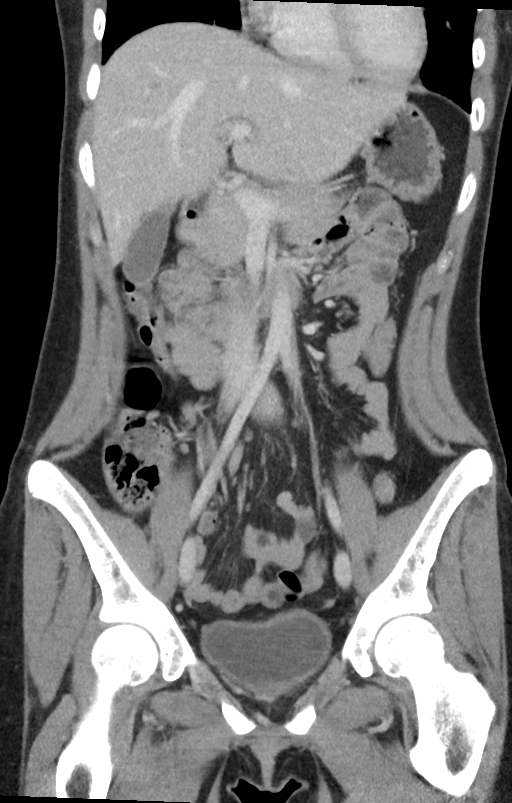
[im 24/54  soft-tissue]
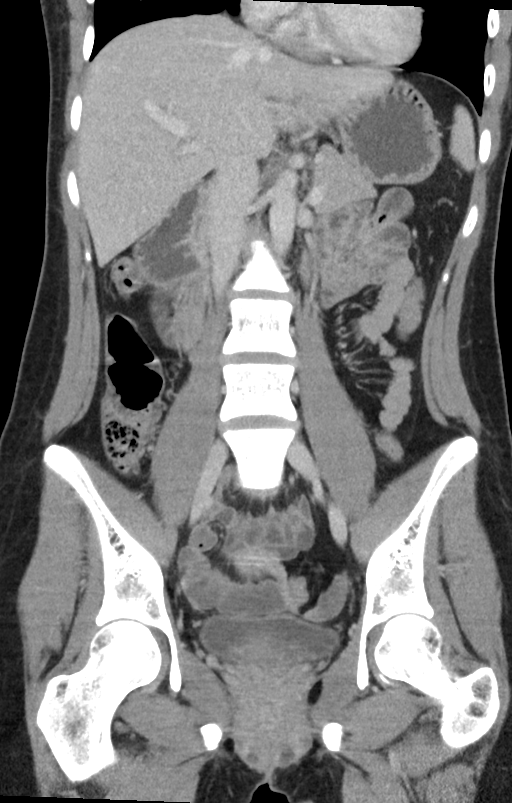
[im 30/54  soft-tissue]
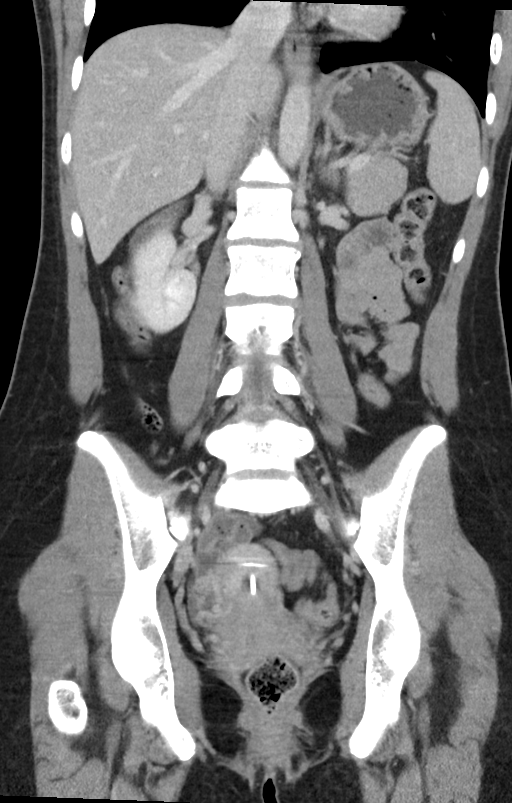

[15 of 46 positions shown; findings below may reference images not displayed]

FINDINGS: Lower chest: No acute abnormality.

Hepatobiliary: No focal liver abnormality is seen. No gallstones,
gallbladder wall thickening, or biliary dilatation.

Pancreas: Unremarkable. No pancreatic ductal dilatation or
surrounding inflammatory changes.

Spleen: Normal in size without focal abnormality.

Adrenals/Urinary Tract: Adrenal glands are unremarkable. Kidneys are
normal, without renal calculi, focal lesion, or hydronephrosis. Mild
wall thickening of the urinary bladder.

Stomach/Bowel: Stomach is within normal limits. Appendix appears
normal. No evidence of bowel wall thickening, distention, or
inflammatory changes.

Vascular/Lymphatic: No significant vascular findings are present. No
enlarged abdominal or pelvic lymph nodes.

Reproductive: Uterus and bilateral adnexa are unremarkable. IUD is
well seated within the uterine fundus.

Other: No abdominal wall hernia or abnormality. No abdominopelvic
ascites.

Musculoskeletal: No acute or significant osseous findings.
IMPRESSION: 1. Mild wall thickening of the urinary bladder may represent
cystitis.
2. IUD well-seated in uterine fundus.
3. Otherwise unremarkable CT of abdomen and pelvis.

By: Qinku Spirovski M.D.

## 2019-09-17 MED ORDER — NORETHINDRONE-ETH ESTRADIOL 1-35 MG-MCG PO TABS
1.00 | ORAL_TABLET | Freq: Every day | ORAL | 1 refills | Status: DC
Start: 2019-09-17 — End: 2020-03-13

## 2019-09-17 NOTE — Progress Notes (Signed)
[facility]                       Date of Virtual Visit: 09/17/2019 10:26 AM        Patient ID: Bridget [name] is a 24 y.o. female.  Attending Physician: Reynold Bowen, MD       Telemedicine Eligibility:      Do you give [place] permission to submit this claim to your insurance and/or bill you for any charges not paid for insurance:    [x]  Yes   []  No    State Location:    [x]  [place]  []  [facility] []  [place]    []  Other (specify):    Patient Identity Verification:    []  State Issued ID  [x]  DOB / photo ID  []  Other (specify):           Chief Complaint:    Medication Refill (BC)               HPI:    Due to current pandemic of COVID 19, patient being seen through a virtual video visit to minimize infectious disease risk to themselves and our medical office staff.    Pt presents for medication refill. Taking OCP for ~ 5-6 years; tolerating without SE. Has a period about 2-3 times/ year. LMP approximately 02/2019. Denies headache, blurry vision, migraines, chest pain, dyspnea, nausea. Denies pregnancy concerns. Has not had pap before.              Problem List:    Patient Active Problem List   Diagnosis   . Chronic urinary tract infection   . Asthma   . Delayed gastric emptying   . Ehlers-Danlos syndrome   . Endometriosis   . Postural orthostatic tachycardia syndrome             Current Meds:    Current Outpatient Medications   Medication Sig Dispense Refill   . metoclopramide (REGLAN) 10 MG tablet Take 1 tablet (10 mg total) by mouth 4 (four) times daily 30 tablet 0   . norethindrone-ethinyl estradiol (ORTHO-NOVUM 1/35) 1-35 MG-MCG per tablet Take 1 tablet by mouth daily 84 tablet 1     No current facility-administered medications for this visit.           Allergies:    Allergies   Allergen Reactions   . Latex Itching     Itching irritation swelling condoms  Itching irritation swelling condoms  Itching irritation swelling condoms             Past  Surgical History:    Past Surgical History:   Procedure Laterality Date   . ADENOIDECTOMY     . EGD N/A 01/23/2018    Procedure: EGD;  Surgeon: Jacqulyn Cane, MD;  Location: [facility];  Service: Gastroenterology;  Laterality: N/A;  EGD  Q1=N/A   . TONSILLECTOMY     . WISDOM TOOTH EXTRACTION             Family History:    Family History   Problem Relation Age of Onset   . Cancer Mother    . Ehlers-Danlos syndrome Sister    . Asthma Brother            Social History:    Social History     Tobacco Use   . Smoking status: Never Smoker   . Smokeless tobacco: Never Used  Substance Use Topics   . Alcohol use: Not Currently   . Drug use: Yes     Comment: "occasional pot"          The following sections were reviewed this encounter by the provider:   Tobacco  Allergies  Meds  Problems  Med Hx  Surg Hx  Fam Hx             Vital Signs:    Ht 1.626 m (5\' 4" )   Wt 50.8 kg (112 lb)   BMI 19.22 kg/m          ROS:    ROS per HPI; all other systems reviewed and are negative            Physical Exam:    Physical Exam  Constitutional:       General: She is not in acute distress.     Appearance: Normal appearance. She is normal weight. She is not ill-appearing, toxic-appearing or diaphoretic.   HENT:      Head: Normocephalic.   Eyes:      General: No scleral icterus.     Conjunctiva/sclera: Conjunctivae normal.   Pulmonary:      Effort: Pulmonary effort is normal. No respiratory distress.   Neurological:      Mental Status: She is alert.   Psychiatric:         Mood and Affect: Mood normal.         Behavior: Behavior normal.         Thought Content: Thought content normal.         Judgment: Judgment normal.                Assessment/ plan:    1. Encounter for surveillance of contraceptive pills  - norethindrone-ethinyl estradiol (ORTHO-NOVUM 1/35) 1-35 MG-MCG per tablet; Take 1 tablet by mouth daily  Dispense: 84 tablet; Refill: 1    2. Endometriosis    Recommended pt schedule [facility] for cervical cancer screen             Follow-up:    Return for Annual exam, pap .         Reynold Bowen, MD

## 2019-11-20 NOTE — Telephone Encounter (Signed)
error 

## 2019-12-17 ENCOUNTER — Other Ambulatory Visit (INDEPENDENT_AMBULATORY_CARE_PROVIDER_SITE_OTHER): Payer: Self-pay | Admitting: Pediatric Infectious Disease

## 2020-01-29 ENCOUNTER — Ambulatory Visit (INDEPENDENT_AMBULATORY_CARE_PROVIDER_SITE_OTHER): Payer: No Typology Code available for payment source | Admitting: Family

## 2020-01-29 ENCOUNTER — Encounter (INDEPENDENT_AMBULATORY_CARE_PROVIDER_SITE_OTHER): Payer: Self-pay | Admitting: Family

## 2020-01-29 VITALS — BP 110/70 | HR 70 | Temp 98.8°F | Ht 64.0 in | Wt 108.4 lb

## 2020-01-29 DIAGNOSIS — K30 Functional dyspepsia: Secondary | ICD-10-CM

## 2020-01-29 DIAGNOSIS — L03213 Periorbital cellulitis: Secondary | ICD-10-CM

## 2020-01-29 MED ORDER — AMOXICILLIN-POT CLAVULANATE 875-125 MG PO TABS
1.00 | ORAL_TABLET | Freq: Two times a day (BID) | ORAL | 0 refills | Status: DC
Start: 2020-01-29 — End: 2020-01-30

## 2020-01-29 MED ORDER — ONDANSETRON 4 MG PO TBDP
4.00 mg | ORAL_TABLET | Freq: Three times a day (TID) | ORAL | 0 refills | Status: DC | PRN
Start: 2020-01-29 — End: 2020-02-01

## 2020-01-29 NOTE — Progress Notes (Signed)
[facility] [facility]                       Date of Exam: 01/29/2020 5:04 PM        Patient ID: Bridget [name] is a 24 y.o. female.  Attending Physician: Levy Sjogren, NP        Chief Complaint:    Chief Complaint   Patient presents with   . Eye Problem               HPI:    Also needs refill of Zofran for nausea.    Eye Problem   The left eye is affected. Episode onset: 01/28/2020. The problem has been gradually worsening. The injury mechanism is unknown. The pain is at a severity of 4/10 (stabbing and pressure). She wears contacts (The patient has been using her glasses). Associated symptoms include blurred vision, eye redness (upper and lower eyelid) and photophobia (on/off). Pertinent negatives include no double vision, fever or foreign body sensation. Associated symptoms comments: Rhinorrhea x 01/27/2020, watering eyes, eyelid edema. She has tried nothing for the symptoms.             Problem List:    Patient Active Problem List   Diagnosis   . Chronic urinary tract infection   . Asthma   . Delayed gastric emptying   . Ehlers-Danlos syndrome   . Endometriosis   . Postural orthostatic tachycardia syndrome             Current Meds:    Outpatient Medications Marked as Taking for the 01/29/20 encounter (Office Visit) with Levy Sjogren, NP   Medication Sig Dispense Refill   . metoclopramide (REGLAN) 10 MG tablet Take 1 tablet (10 mg total) by mouth 4 (four) times daily 30 tablet 0   . norethindrone-ethinyl estradiol (ORTHO-NOVUM 1/35) 1-35 MG-MCG per tablet Take 1 tablet by mouth daily 84 tablet 1   . ondansetron (ZOFRAN) 4 MG tablet Take 4 mg by mouth every 8 (eight) hours as needed for Nausea            Allergies:    Allergies   Allergen Reactions   . Latex Itching     Itching irritation swelling condoms  Itching irritation swelling condoms  Itching irritation swelling condoms             Past Surgical History:    Past Surgical History:   Procedure Laterality Date    . ADENOIDECTOMY     . EGD N/A 01/23/2018    Procedure: EGD;  Surgeon: Jacqulyn Cane, MD;  Location: [facility];  Service: Gastroenterology;  Laterality: N/A;  EGD  Q1=N/A   . TONSILLECTOMY     . WISDOM TOOTH EXTRACTION             Family History:    Family History   Problem Relation Age of Onset   . Cancer Mother    . Ehlers-Danlos syndrome Sister    . Asthma Brother            Social History:    Social History     Tobacco Use   . Smoking status: Never Smoker   . Smokeless tobacco: Never Used   . Tobacco comment: the patient vapes nicotine    Vaping Use   . Vaping Use: Every day   . Start date: 04/14/2018   Substance Use Topics   . Alcohol use: Not Currently   .  Drug use: Yes     Comment: "occasional pot"          The following sections were reviewed this encounter by the provider:   Tobacco  Allergies  Meds  Problems  Med Hx  Surg Hx  Fam Hx             Vital Signs:    BP 110/70 (BP Site: Right arm, Patient Position: Sitting, Cuff Size: Medium)   Pulse 70   Temp 98.8 F (37.1 C)   Ht 1.626 m (5\' 4" )   Wt 49.2 kg (108 lb 6.4 oz)   BMI 18.61 kg/m          ROS:    Review of Systems   Constitutional: Negative for chills and fever.   HENT: Positive for congestion.    Eyes: Positive for blurred vision, photophobia (on/off) and redness (upper and lower eyelid). Negative for double vision and visual disturbance.              Physical Exam:    Physical Exam  Constitutional:       Appearance: Normal appearance. She is normal weight.   HENT:      Head: Normocephalic and atraumatic.      Right Ear: Tympanic membrane, ear canal and external ear normal.      Left Ear: Tympanic membrane, ear canal and external ear normal.      Nose: Congestion present.      Mouth/Throat:      Mouth: Mucous membranes are moist.      Pharynx: Oropharynx is clear. No posterior oropharyngeal erythema.   Eyes:      General: Vision grossly intact. Gaze aligned appropriately.         Right eye: No discharge or hordeolum.          Left eye: No discharge or hordeolum.      Extraocular Movements: Extraocular movements intact.      Right eye: Normal extraocular motion.      Left eye: Normal extraocular motion.      Conjunctiva/sclera:      Right eye: Right conjunctiva is not injected.      Left eye: Left conjunctiva is injected.      Pupils: Pupils are equal, round, and reactive to light.      Comments: Edema of upper and lower left eyelids. No pain with EOMs.   Musculoskeletal:      Cervical back: Normal range of motion and neck supple.   Lymphadenopathy:      Cervical: Cervical adenopathy present.   Neurological:      Mental Status: She is alert.                       Assessment:    1. Preseptal cellulitis of left eye  - amoxicillin-clavulanate (AUGMENTIN) 875-125 MG per tablet; Take 1 tablet by mouth 2 (two) times daily for 10 days  Dispense: 20 tablet; Refill: 0    2. Delayed gastric emptying  - ondansetron (Zofran ODT) 4 MG disintegrating tablet; Take 1 tablet (4 mg total) by mouth every 8 (eight) hours as needed for Nausea  Dispense: 30 tablet; Refill: 0            Plan:      Take antibiotic as ordered  If increase in swelling, eye pain with eye movement either go to ER or attempt same dday appmt with Ophthalmologist.          Follow-up:  No follow-ups on file.         Odessa Fleming, NP

## 2020-01-30 ENCOUNTER — Observation Stay: Payer: No Typology Code available for payment source

## 2020-01-30 ENCOUNTER — Observation Stay
Admission: EM | Admit: 2020-01-30 | Discharge: 2020-02-01 | DRG: 392 | Disposition: A | Discharge status: Home / Self Care | Payer: No Typology Code available for payment source | Attending: Hospitalist | Admitting: Hospitalist

## 2020-01-30 DIAGNOSIS — F1290 Cannabis use, unspecified, uncomplicated: Secondary | ICD-10-CM | POA: Diagnosis present

## 2020-01-30 DIAGNOSIS — Q796 Ehlers-Danlos syndrome, unspecified: Secondary | ICD-10-CM

## 2020-01-30 DIAGNOSIS — I498 Other specified cardiac arrhythmias: Secondary | ICD-10-CM | POA: Diagnosis present

## 2020-01-30 DIAGNOSIS — Z79899 Other long term (current) drug therapy: Secondary | ICD-10-CM

## 2020-01-30 DIAGNOSIS — Z5189 Encounter for other specified aftercare: Secondary | ICD-10-CM

## 2020-01-30 DIAGNOSIS — K2970 Gastritis, unspecified, without bleeding: Principal | ICD-10-CM | POA: Diagnosis present

## 2020-01-30 DIAGNOSIS — R112 Nausea with vomiting, unspecified: Secondary | ICD-10-CM | POA: Diagnosis present

## 2020-01-30 DIAGNOSIS — J45909 Unspecified asthma, uncomplicated: Secondary | ICD-10-CM | POA: Diagnosis present

## 2020-01-30 LAB — RAPID DRUG SCREEN, URINE
Barbiturate Screen, UR: NEGATIVE
Benzodiazepine Screen, UR: NEGATIVE
Cannabinoid Screen, UR: POSITIVE — AB
Cocaine, UR: NEGATIVE
Opiate Screen, UR: NEGATIVE
PCP Screen, UR: NEGATIVE
Urine Amphetamine Screen: NEGATIVE

## 2020-01-30 LAB — URINALYSIS REFLEX TO MICROSCOPIC EXAM - REFLEX TO CULTURE
Bilirubin, UA: NEGATIVE
Blood, UA: NEGATIVE
Glucose, UA: 150 — AB
Ketones UA: >=80 — AB
Leukocyte Esterase, UA: NEGATIVE
Nitrite, UA: NEGATIVE
Protein, UR: 100 — AB
Specific Gravity UA: 1.021 (ref 1.001–1.035)
Urine pH: 6 (ref 5.0–8.0)
Urobilinogen, UA: NORMAL mg/dL (ref 0.2–2.0)

## 2020-01-30 LAB — CBC AND DIFFERENTIAL
Absolute NRBC: 0 10*3/uL (ref 0.00–0.00)
Basophils Absolute Automated: 0.05 10*3/uL (ref 0.00–0.08)
Basophils Automated: 0.7 %
Eosinophils Absolute Automated: 0 10*3/uL (ref 0.00–0.44)
Eosinophils Automated: 0 %
Hematocrit: 43.5 % (ref 34.7–43.7)
Hgb: 14.5 g/dL (ref 11.4–14.8)
Immature Granulocytes Absolute: 0.02 10*3/uL (ref 0.00–0.07)
Immature Granulocytes: 0.3 %
Lymphocytes Absolute Automated: 1.35 10*3/uL (ref 0.42–3.22)
Lymphocytes Automated: 19.1 %
MCH: 30.6 pg (ref 25.1–33.5)
MCHC: 33.3 g/dL (ref 31.5–35.8)
MCV: 91.8 fL (ref 78.0–96.0)
MPV: 9.9 fL (ref 8.9–12.5)
Monocytes Absolute Automated: 0.38 10*3/uL (ref 0.21–0.85)
Monocytes: 5.4 %
Neutrophils Absolute: 5.28 10*3/uL (ref 1.10–6.33)
Neutrophils: 74.5 %
Nucleated RBC: 0 /100 WBC (ref 0.0–0.0)
Platelets: 262 10*3/uL (ref 142–346)
RBC: 4.74 10*6/uL (ref 3.90–5.10)
RDW: 13 % (ref 11–15)
WBC: 7.08 10*3/uL (ref 3.10–9.50)

## 2020-01-30 LAB — COMPREHENSIVE METABOLIC PANEL
ALT: 12 U/L (ref 0–55)
AST (SGOT): 17 U/L (ref 5–34)
Albumin/Globulin Ratio: 1.5 (ref 0.9–2.2)
Albumin: 4.6 g/dL (ref 3.5–5.0)
Alkaline Phosphatase: 64 U/L (ref 37–106)
Anion Gap: 13 (ref 5.0–15.0)
BUN: 8 mg/dL (ref 7.0–19.0)
Bilirubin, Total: 0.4 mg/dL (ref 0.2–1.2)
CO2: 22 mEq/L (ref 22–29)
Calcium: 9.9 mg/dL (ref 8.5–10.5)
Chloride: 100 mEq/L (ref 100–111)
Creatinine: 0.8 mg/dL (ref 0.6–1.0)
Globulin: 3.1 g/dL (ref 2.0–3.6)
Glucose: 136 mg/dL — ABNORMAL HIGH (ref 70–100)
Potassium: 4.3 mEq/L (ref 3.5–5.1)
Protein, Total: 7.7 g/dL (ref 6.0–8.3)
Sodium: 135 mEq/L — ABNORMAL LOW (ref 136–145)

## 2020-01-30 LAB — ECG 12-LEAD
Atrial Rate: 83 {beats}/min
P Axis: 58 degrees
P-R Interval: 140 ms
Q-T Interval: 382 ms
QRS Duration: 88 ms
QTC Calculation (Bezet): 448 ms
R Axis: 50 degrees
T Axis: 51 degrees
Ventricular Rate: 83 {beats}/min

## 2020-01-30 LAB — POCT PREGNANCY TEST, URINE HCG: POCT Pregnancy HCG Test, UR: NEGATIVE

## 2020-01-30 LAB — GFR: EGFR: >60

## 2020-01-30 LAB — LIPASE: Lipase: 16 U/L (ref 8–78)

## 2020-01-30 LAB — MAGNESIUM: Magnesium: 1.9 mg/dL (ref 1.6–2.6)

## 2020-01-30 MED ORDER — PANTOPRAZOLE SODIUM 40 MG IV SOLR
40.00 mg | Freq: Every day | INTRAVENOUS | Status: DC
Start: 2020-01-30 — End: 2020-01-30
  Administered 2020-01-30: 17:00:00 40 mg via INTRAVENOUS
  Filled 2020-01-30: qty 40

## 2020-01-30 MED ORDER — PANTOPRAZOLE SODIUM 40 MG IV SOLR
40.00 mg | Freq: Two times a day (BID) | INTRAVENOUS | Status: DC
Start: 2020-01-31 — End: 2020-02-01
  Administered 2020-01-31 – 2020-02-01 (×3): 40 mg via INTRAVENOUS
  Filled 2020-01-30 (×3): qty 40

## 2020-01-30 MED ORDER — NALOXONE HCL 0.4 MG/ML IJ SOLN (WRAP)
0.20 mg | INTRAMUSCULAR | Status: DC | PRN
Start: 2020-01-30 — End: 2020-02-01

## 2020-01-30 MED ORDER — MELATONIN 3 MG PO TABS
3.0000 mg | ORAL_TABLET | Freq: Every evening | ORAL | Status: DC | PRN
Start: 2020-01-30 — End: 2020-02-01
  Administered 2020-01-30 – 2020-01-31 (×2): 3 mg via ORAL
  Filled 2020-01-30 (×2): qty 1

## 2020-01-30 MED ORDER — PROMETHAZINE HCL 25 MG/ML IJ SOLN
12.50 mg | Freq: Once | INTRAMUSCULAR | Status: AC
Start: 2020-01-30 — End: 2020-01-30
  Administered 2020-01-30: 05:00:00 12.5 mg via INTRAVENOUS
  Filled 2020-01-30: qty 1
  Filled 2020-01-30: qty 0.5

## 2020-01-30 MED ORDER — LACTATED RINGERS IV BOLUS
1000.00 mL | Freq: Once | INTRAVENOUS | Status: AC
Start: 2020-01-30 — End: 2020-01-30
  Administered 2020-01-30: 02:00:00 1000 mL via INTRAVENOUS

## 2020-01-30 MED ORDER — ONDANSETRON HCL 4 MG/2ML IJ SOLN
4.00 mg | Freq: Once | INTRAMUSCULAR | Status: AC
Start: 2020-01-30 — End: 2020-01-30
  Administered 2020-01-30: 03:00:00 4 mg via INTRAVENOUS
  Filled 2020-01-30: qty 2

## 2020-01-30 MED ORDER — ENOXAPARIN SODIUM 40 MG/0.4ML SC SOLN
40.00 mg | Freq: Every day | SUBCUTANEOUS | Status: DC
Start: 2020-01-30 — End: 2020-02-01
  Administered 2020-01-30 – 2020-01-31 (×2): 40 mg via SUBCUTANEOUS
  Filled 2020-01-30 (×2): qty 0.4

## 2020-01-30 MED ORDER — ONDANSETRON HCL 4 MG/2ML IJ SOLN
4.00 mg | Freq: Three times a day (TID) | INTRAMUSCULAR | Status: DC | PRN
Start: 2020-01-30 — End: 2020-01-31
  Administered 2020-01-30 – 2020-01-31 (×2): 4 mg via INTRAVENOUS
  Filled 2020-01-30 (×2): qty 2

## 2020-01-30 MED ORDER — METOCLOPRAMIDE HCL 5 MG/ML IJ SOLN
10.00 mg | Freq: Four times a day (QID) | INTRAMUSCULAR | Status: DC
Start: 2020-01-30 — End: 2020-02-01
  Administered 2020-01-30 – 2020-02-01 (×6): 10 mg via INTRAVENOUS
  Filled 2020-01-30 (×8): qty 2

## 2020-01-30 MED ORDER — DEXTROSE 50 % IV SOLN
12.50 g | INTRAVENOUS | Status: DC | PRN
Start: 2020-01-30 — End: 2020-02-01

## 2020-01-30 MED ORDER — SODIUM CHLORIDE 0.9 % IV SOLN
INTRAVENOUS | Status: DC
Start: 2020-01-30 — End: 2020-02-01

## 2020-01-30 MED ORDER — PATIENT SUPPLIED NON FORMULARY
1.00 | Freq: Every day | Status: DC
Start: 2020-01-31 — End: 2020-02-01

## 2020-01-30 MED ORDER — GLUCAGON 1 MG IJ SOLR (WRAP)
1.00 mg | INTRAMUSCULAR | Status: DC | PRN
Start: 2020-01-30 — End: 2020-02-01

## 2020-01-30 MED ORDER — FAMOTIDINE 10 MG/ML IV SOLN (WRAP)
20.00 mg | Freq: Once | INTRAVENOUS | Status: AC
Start: 2020-01-30 — End: 2020-01-30
  Administered 2020-01-30: 04:00:00 20 mg via INTRAVENOUS
  Filled 2020-01-30: qty 2

## 2020-01-30 MED ORDER — LACTATED RINGERS IV BOLUS
1000.00 mL | Freq: Once | INTRAVENOUS | Status: AC
Start: 2020-01-30 — End: 2020-01-30
  Administered 2020-01-30: 05:00:00 1000 mL via INTRAVENOUS

## 2020-01-30 MED ORDER — DIPHENHYDRAMINE HCL 25 MG PO CAPS
25.00 mg | ORAL_CAPSULE | Freq: Four times a day (QID) | ORAL | Status: DC | PRN
Start: 2020-01-30 — End: 2020-02-01

## 2020-01-30 MED ORDER — METOCLOPRAMIDE HCL 5 MG/ML IJ SOLN
10.00 mg | Freq: Once | INTRAMUSCULAR | Status: AC
Start: 2020-01-30 — End: 2020-01-30
  Administered 2020-01-30: 10:00:00 10 mg via INTRAVENOUS
  Filled 2020-01-30: qty 2

## 2020-01-30 MED ORDER — ACETAMINOPHEN 325 MG PO TABS
650.0000 mg | ORAL_TABLET | Freq: Four times a day (QID) | ORAL | Status: DC | PRN
Start: 2020-01-30 — End: 2020-01-31
  Filled 2020-01-30: qty 2

## 2020-01-30 MED ORDER — SODIUM CHLORIDE 0.9 % IV SOLN
12.5000 mg | Freq: Once | INTRAVENOUS | Status: DC
Start: 2020-01-30 — End: 2020-01-30

## 2020-01-30 MED ORDER — ACETAMINOPHEN 650 MG RE SUPP
650.00 mg | Freq: Four times a day (QID) | RECTAL | Status: DC | PRN
Start: 2020-01-30 — End: 2020-01-31

## 2020-01-30 MED ORDER — GLUCOSE 40 % PO GEL
15.00 g | ORAL | Status: DC | PRN
Start: 2020-01-30 — End: 2020-02-01

## 2020-01-30 MED ORDER — CETIRIZINE HCL 10 MG PO TABS
10.0000 mg | ORAL_TABLET | Freq: Every day | ORAL | Status: DC
Start: 2020-01-31 — End: 2020-02-01
  Administered 2020-02-01: 09:00:00 10 mg via ORAL
  Filled 2020-01-30: qty 1

## 2020-01-30 MED ORDER — ONDANSETRON HCL 4 MG/2ML IJ SOLN
4.00 mg | Freq: Once | INTRAMUSCULAR | Status: AC
Start: 2020-01-30 — End: 2020-01-30
  Administered 2020-01-30: 02:00:00 4 mg via INTRAVENOUS
  Filled 2020-01-30: qty 2

## 2020-01-30 MED ORDER — LORAZEPAM 2 MG/ML IJ SOLN
0.50 mg | Freq: Once | INTRAMUSCULAR | Status: AC
Start: 2020-01-30 — End: 2020-01-30
  Administered 2020-01-30: 12:00:00 0.5 mg via INTRAVENOUS
  Filled 2020-01-30: qty 1

## 2020-01-30 NOTE — H&P (Addendum)
ADMISSION HISTORY AND PHYSICAL EXAM    Date Time: 01/30/20 1:11 PM  Patient Name: Bridget [name]  Attending Physician: Jerral Ralph, MD  Primary Care Physician: Lind Covert, MD    CC: intractable nausea/vomiting  Pt seen and examined in the ER at 12:40pm      Assessment:   24 yo F witih PMHx POTS, Ehlers Danlos, and chronic nausea/pain who presents with several days of acutely worsened symptoms with intractable nausea/vomiting.     Plan:   # Nausea / Vomiting > Abdominal pain; suspect acute on chronic flare of her baseline symptoms; possibly viral/gastritis, consider 2/2 cannibanoids (pos urine tox screen in 2019)  - admit observation, no tele needed  - discussed with mother at bedside re: plan  - clear liquid diet (patient may advance as tolerated)  - reglan 10mg  IV q6 scheduled  - zofran IV PRN for breakthrough nausea  - protonix IV daily  - mom requesting zyrtec PO daily   - check KUB to r/o other etiologies (less suspect ileus/obstruction/etc)  - gentle IVF until consistently tolerating PO  - repeat BMP/Mag in AM x1  - mom has requested consultation from GI/[facility] (was on the phone with office during interview)  - messaged ER RNs to please check urine tox screen and 12 lead EKG (eval QTc) PRIOR to transfer to floor   - pending progress, could also consider low dose PRN IV ativan vs haldol vs decadron vs phenergan supp (per mom - scopolamine patch not helpful); discussed with mom that if symptoms improved could anticipate d/c tomorrow     # POTS  # Lorinda Creed  # S/p both Pfizer COVID vaccines, >2 months ago    Full Code       Disposition: (Please see PAF column for Expected D/C Date)   Today's date: 01/30/2020  Admit Date: 01/30/2020  1:59 AM  Service status: Observation  Clinical Milestones: improve nausea, needs iVF  Anticipated discharge needs: home, outpt f/u     History of Presenting Illness:   Bridget [name] is a 24 y.o. female who presents to the hospital with     24 yo F with PMHx POTS  and Ehlers-Danlos syndrome who presents with several days of intractable nausea and vomiting (that is worse than her baseline). Mom at bedside provides most of the history. At baseline, patient every day has stomach upset and nausea. Takes zofran regularly but does not need it every day, does not vomit every day. Things have started to be different than her baseline starting 2 weeks ago. Stomach/central abdominal pain is getting worse. 3 days ago patient starting having clear rhinorrhea. Was taking Dayquil (mom thinks she's sensitive, perhaps Dayquil set her over). Then 2 days ago started having eye lid swelling. Went to [employer] [facility] [employer] office (notes in Epic) where augmentin x10 days along with refill for zofran was prescribed. Patient did NOT take the augmentin yet, however mom reports overnight the eye swelling resolved. Then in the last 2 days, also constant nausea and dry heaving. Last solid/liquids that stayed in (crackers) was yesterday. Last BM was today formed to soft of normal brown color, no black/brown/purple. Lives at home with mom, dad, and sister. Sister also has Lorinda Creed and has chronic GI/nausea symptoms at baseline. Sister of recent having more diarrhea.     No recent travel outside [place] area. No new medications. She finished her semester in May (but has been home from college in [place]). Has a job but hasn't started  yet.     Per mom - scopolamine does NOT help.     On ROS she reports: clear rhinorrhea, pain behind the eyes (improved), occasional palpitations, burning sensation in center of throat/chest  She denies: Headache, dizziness, chest pain, cough, shortness of breath    Has received both Pfizer COVID vaccines ~2 months ago.     Past Medical History:     Past Medical History:   Diagnosis Date   . Abdominal pain    . Asthma     mild seasonal controlled   . Complication of anesthesia    . Post-operative nausea and vomiting    . POTS (postural orthostatic tachycardia syndrome)     [facility]/stable for many yrs   . Sinus trouble     seasonal   . Yeast infection involving the vagina and surrounding area 05/09/2015       Available old records reviewed, including:  outpt clinic records, labs    Past Surgical History:     Past Surgical History:   Procedure Laterality Date   . ADENOIDECTOMY     . EGD N/A 01/23/2018    Procedure: EGD;  Surgeon: Jacqulyn Cane, MD;  Location: [facility];  Service: Gastroenterology;  Laterality: N/A;  EGD  Q1=N/A   . TONSILLECTOMY     . WISDOM TOOTH EXTRACTION         Family History:   Sister: Interior and spatial designer, endometriosis    Social History:     Social History     Tobacco Use   Smoking Status Never Smoker   Smokeless Tobacco Never Used   Tobacco Comment    the patient vapes nicotine      Social History     Substance and Sexual Activity   Alcohol Use Not Currently     Social History     Substance and Sexual Activity   Drug Use Yes    Comment: Pt. uses marijuana every day. Pt. reports not using it for the past 3 days.       Allergies:     Allergies   Allergen Reactions   . Latex Itching     Itching irritation swelling condoms  Itching irritation swelling condoms  Itching irritation swelling condoms       Medications:     Home Medications     Med List Status: In Progress Set By: Novella Olive, RN at 01/30/2020  2:28 AM                amoxicillin-clavulanate (AUGMENTIN) 875-125 MG per tablet     Take 1 tablet by mouth 2 (two) times daily for 10 days     metoclopramide (REGLAN) 10 MG tablet     Take 1 tablet (10 mg total) by mouth 4 (four) times daily     norethindrone-ethinyl estradiol (ORTHO-NOVUM 1/35) 1-35 MG-MCG per tablet     Take 1 tablet by mouth daily     ondansetron (Zofran ODT) 4 MG disintegrating tablet     Take 1 tablet (4 mg total) by mouth every 8 (eight) hours as needed for Nausea     ondansetron (ZOFRAN) 4 MG tablet     Take 4 mg by mouth every 8 (eight) hours as needed for Nausea            Method by which medications were  confirmed on admission: discussed with mom    Review of Systems:   All other systems were reviewed and are  negative except: per HPI    Physical Exam:     Patient Vitals for the past 24 hrs:   BP Temp Temp src Pulse Resp SpO2 Height Weight   01/30/20 1153 137/87 - - 74 - 100 % - -   01/30/20 1018 119/72 - - 66 - 100 % - -   01/30/20 0815 132/77 - - 61 - - - -   01/30/20 0814 - - - - 21 99 % - -   01/30/20 0716 122/82 - - 99 - - - -   01/30/20 0715 - - - - 21 100 % - -   01/30/20 0630 129/69 - - 84 16 100 % - -   01/30/20 0430 139/89 - - 63 - 99 % - -   01/30/20 0330 150/70 - - 81 20 97 % - -   01/30/20 0300 138/90 - - (!) 59 - 99 % - -   01/30/20 0150 131/81 98.4 F (36.9 C) Temporal 84 18 98 % 1.626 m (5\' 4" ) 47.6 kg (105 lb)     Body mass index is 18.02 kg/m.    Intake/Output Summary (Last 24 hours) at 01/30/2020 1311  Last data filed at 01/30/2020 0513  Gross per 24 hour   Intake 1000 ml   Output -   Net 1000 ml       General: awake, alert, oriented x 3; no acute distress, resting comfortably, easily awoken; sits up to dry heave into blue emesis bag but nothing coming out  HEENT: perrla, eomi, sclera anicteric  oropharynx clear without lesions, mucous membranes moist, no tongue plaques  Neck: supple, no lymphadenopathy, no thyromegaly, no JVD, no carotid bruits  Cardiovascular: regular rate and rhythm, no murmurs, rubs or gallops  Lungs: clear to auscultation bilaterally, without wheezing, rhonchi, or rales  Abdomen: thin, not tympanic, not distended, BS very quiet throughout all 4 quadrants but present, mild facial grimacing with modest umbilical palpation, no flank tenderness, no chest wall tenderness, no suprapubic tenderness  Extremities: no clubbing, cyanosis, or edema  Neuro: cranial nerves grossly intact, strength 5/5 in upper and lower extremities, sensation intact,   Skin: no rashes or lesions noted        Labs:     Results     Procedure Component Value Units Date/Time    UA Reflex to Micro - Reflex to  Culture [[id number]]  (Abnormal) Collected: 01/30/20 0509     Updated: 01/30/20 0544     Urine Type Urine, Clean Ca     Color, UA Yellow     Clarity, UA Hazy     Specific Gravity UA 1.021     Urine pH 6.0     Leukocyte Esterase, UA Negative     Nitrite, UA Negative     Protein, UR 100     Glucose, UA 150     Ketones UA >=80     Urobilinogen, UA Normal mg/dL      Bilirubin, UA Negative     Blood, UA Negative     RBC, UA 0 - 2 /hpf      WBC, UA 0 - 5 /hpf      Squamous Epithelial Cells, Urine 0 - 5 /hpf     POCT Pregnancy Test, Urine HCG [[id number]] Collected: 01/30/20 0511     Updated: 01/30/20 0514     POCT QC Pass     POCT Pregnancy HCG Test, UR Negative     Comment: Negative Value is Normal in  Healthy Males or Healthy non-pregnant Females    CBC and differential [[id number]] Collected: 01/30/20 0200    Specimen: Blood Updated: 01/30/20 0237     WBC 7.08 x10 3/uL      Hgb 14.5 g/dL      Hematocrit 40.9 %      Platelets 262 x10 3/uL      RBC 4.74 x10 6/uL      MCV 91.8 fL      MCH 30.6 pg      MCHC 33.3 g/dL      RDW 13 %      MPV 9.9 fL      Neutrophils 74.5 %      Lymphocytes Automated 19.1 %      Monocytes 5.4 %      Eosinophils Automated 0.0 %      Basophils Automated 0.7 %      Immature Granulocytes 0.3 %      Nucleated RBC 0.0 /100 WBC      Neutrophils Absolute 5.28 x10 3/uL      Lymphocytes Absolute Automated 1.35 x10 3/uL      Monocytes Absolute Automated 0.38 x10 3/uL      Eosinophils Absolute Automated 0.00 x10 3/uL      Basophils Absolute Automated 0.05 x10 3/uL      Immature Granulocytes Absolute 0.02 x10 3/uL      Absolute NRBC 0.00 x10 3/uL     GFR [[id number]] Collected: 01/30/20 0200     Updated: 01/30/20 0237     EGFR >60.0    Magnesium [[id number]] Collected: 01/30/20 0200     Updated: 01/30/20 0237     Magnesium 1.9 mg/dL     Comprehensive metabolic panel [[id number]]  (Abnormal) Collected: 01/30/20 0200    Specimen: Blood Updated: 01/30/20 0237     Glucose 136 mg/dL      BUN 8.0 mg/dL      Creatinine  0.8 mg/dL      Sodium 528 mEq/L      Potassium 4.3 mEq/L      Chloride 100 mEq/L      CO2 22 mEq/L      Calcium 9.9 mg/dL      Protein, Total 7.7 g/dL      Albumin 4.6 g/dL      AST (SGOT) 17 U/L      ALT 12 U/L      Alkaline Phosphatase 64 U/L      Bilirubin, Total 0.4 mg/dL      Globulin 3.1 g/dL      Albumin/Globulin Ratio 1.5     Anion Gap 13.0    Lipase [[id number]] Collected: 01/30/20 0200    Specimen: Blood Updated: 01/30/20 0237     Lipase 16 U/L     Magnesium [[id number]] Collected: 01/30/20 0200    Specimen: Blood Updated: 01/30/20 0200          Imaging personally reviewed, including: none    Safety Checklist  DVT prophylaxis:  CHEST guideline (See page [id number]) Chemical and Mechanical   Foley:  [place] Rn Foley protocol Not present   IVs:  Peripheral IV   PT/OT: Not needed   Daily CBC & or Chem ordered:  SHM/ABIM guidelines (see #5) Yes, due to clinical and lab instability   Reference for approximate charges of common labs: CBC auto diff - $76  BMP - $99  Mg - $79    Signed by: Jerral Ralph, MD   IH:[id number], Kieth Brightly, MD

## 2020-01-30 NOTE — ED Provider Notes (Signed)
ED SUBSEQUENT CARE NOTE (Care after ED handoff)      Patient received in bedside sign out from Dr. Guy Franco, Mont Dutton, MD approximately 9:29 AM    Patient awaiting: reassessment      Clinical Course:    Discussed with patient and mother. Patient continues to feel badly - nausea and does not believe that she can keep anything down. She has had vomiting during the ED course. Patient's labs are unremarkable. Patient does not want to be admitted but she thinks that she will do poorly at home. She has taken Reglan with success in the past, but it has not resolved symptoms in the ED. As such, patient opted for admission. Gastroenterology consulted.     Final Diagnosis and Disposition:      Final diagnoses:   Nausea and vomiting, intractability of vomiting not specified, unspecified vomiting type       ED Disposition     None            Scribe Attestation:      No scribe involved in the care of this patient            Britt Boozer, MD  01/30/20 1159

## 2020-01-30 NOTE — Pharmacy Admission Med History Basic (Signed)
Admission Medication History - BASIC    Patient's Pharmacy Information:    Primary Pharmacy Updated in Epic: Yes  Preferred pharmacy:   [facility] - [place], [place] - [address] [place] [address]  Phone: [phone] Fax: [phone]      Notes:     Primary Source of Medication History: Mother and Self      Prior to Admission Medication List:  Home Medications       Med List Status: Pharmacy Completed Set By: Andrez Grime at 01/30/2020  1:24 PM        Status Comment        01/30/2020  1:38 PM    Pharmacy med rec tech verified all medications and last doses with mom at bedside                  metoclopramide (REGLAN) 10 MG tablet     Take 1 tablet (10 mg total) by mouth 4 (four) times daily     Patient taking differently: Take 10 mg by mouth as needed        norethindrone-ethinyl estradiol (ORTHO-NOVUM 1/35) 1-35 MG-MCG per tablet     Take 1 tablet by mouth daily     ondansetron (Zofran ODT) 4 MG disintegrating tablet     Take 1 tablet (4 mg total) by mouth every 8 (eight) hours as needed for Nausea         Notes:              Summary:    There are no significant changes to the medication list.    Patient demonstrates Good compliance with medications.  Patient demonstrates Good knowledge of medications.     Additional Comments:     Andrez Grime

## 2020-01-30 NOTE — Discharge Instr - AVS First Page (Addendum)
Reason for your Hospital Admission:  Nausea / Vomiting  Dehydration     You came in to the hospital due to nausea and vomiting.  Abdominal xray normal.  Seen by gastroenterologist didn't recommend any testing. Recommended Protonix and Reglan for the nausea.     Instructions for after your discharge:  -- Schedule a follow up visit with Bridget [name] (Formerly [facility]) if symptoms are not improved over the next several days  -- Diet: Until you are feeling better, please stick to a brat diet. Please also stay well hydrated  -- You should avoid alcohol and other drugs until you are feeling back to normal   -- Take phenergan Po/ suppository as needed for nausea   - Take Reglan three times daily   - Take Protonix 40 mg daily for two weeks then you can take Pepcid which is over the counter  -

## 2020-01-30 NOTE — ED Provider Notes (Signed)
[facility] EMERGENCY DEPARTMENT H&P         CLINICAL SUMMARY          Diagnosis:    .     Final diagnoses:   None                 Disposition:      ED Disposition     None                       CLINICAL INFORMATION        HPI:      Chief Complaint: Emesis  .    Bridget [name] is a 24 y.o. female who presents with vomiting.  Patient with vomiting for 1 day progressive worsening into the evening.  Unable to tolerate oral intake.  No hematemesis.  No diarrhea.  No fever.  Some upper abdominal discomfort.  Patient has history of vomiting and hospital admissions due to same but nothing past few years.  Patient does use marijuana periodically has not used for the past3 daysdid have urine output earlier.  No abdominal surgery patient took Reglan earlier without relief.    History obtained from: Patient      ROS:      Positive and negative ROS elements as per HPI.   All Other Systems Reviewed and Negative: y        Physical Exam:      Pulse 84  BP 131/81  Resp 18  SpO2 98 %  Temp 98.4 F (36.9 C)    Physical Exam   Nursing note and vitals reviewed.   Constitutional: . Pt appears well-developed and well-nourished.  Actively vomiting slightly pale  Eyes: Conj normal . No icterus  ENT: nl phonation no ear   Cardiovascular: Normal rate, regular rhythm and normal heart sounds.   Pulmonary/Chest: Effort normal  No respiratory distress. breath sounds normal.  GI: Soft. There is no guarding.  No peritoneal signs  Musculoskeletal:  no deformity. FROM  Neurological: A&Ox3 GCS eye subscore is 4. GCS verbal subscore is 5. GCS motor subscore is 6. MAE   Skin: Skin is warm and dry.   Psychiatric: Pt has a normal  affect. Pt behavior is normal.               PAST HISTORY        Primary Care Provider: Lind Covert, MD        PMH/PSH:    .     Past Medical History:   Diagnosis Date   . Abdominal pain    . Asthma     mild seasonal controlled   . Complication of anesthesia    . Post-operative nausea and vomiting    . POTS  (postural orthostatic tachycardia syndrome)     [facility]/stable for many yrs   . Sinus trouble     seasonal   . Yeast infection involving the vagina and surrounding area 05/09/2015       She has a past surgical history that includes Tonsillectomy; Wisdom tooth extraction; EGD (N/A, 01/23/2018); and ADENOIDECTOMY.      Social/Family History:      She reports that she has never smoked. She has never used smokeless tobacco. She reports previous alcohol use. She reports current drug use.    Family History   Problem Relation Age of Onset   . Cancer Mother    . Ehlers-Danlos syndrome Sister    .  Asthma Brother          Listed Medications on Arrival:    .     Previous Medications    AMOXICILLIN-CLAVULANATE (AUGMENTIN) 875-125 MG PER TABLET    Take 1 tablet by mouth 2 (two) times daily for 10 days    METOCLOPRAMIDE (REGLAN) 10 MG TABLET    Take 1 tablet (10 mg total) by mouth 4 (four) times daily    NORETHINDRONE-ETHINYL ESTRADIOL (ORTHO-NOVUM 1/35) 1-35 MG-MCG PER TABLET    Take 1 tablet by mouth daily    ONDANSETRON (ZOFRAN ODT) 4 MG DISINTEGRATING TABLET    Take 1 tablet (4 mg total) by mouth every 8 (eight) hours as needed for Nausea    ONDANSETRON (ZOFRAN) 4 MG TABLET    Take 4 mg by mouth every 8 (eight) hours as needed for Nausea      Allergies: She is allergic to latex.            VISIT INFORMATION                    Medications Given in the ED:    .     ED Medication Orders (From admission, onward)    Start Ordered     Status Ordering Provider    01/30/20 0453 01/30/20 0452  lactated ringers bolus 1,000 mL  Once     Route: Intravenous  Ordered Dose: 1,000 mL     Last [facility] action: New Bag Nikki Rusnak E    01/30/20 0453 01/30/20 0452  promethazine (PHENERGAN) 12.5 mg in sodium chloride 0.9 % 50 mL IVPB  Once     Route: Intravenous  Ordered Dose: 12.5 mg     Last MAR action: Not Given Amiyrah Lamere E    01/30/20 0317 01/30/20 0316  famotidine (PEPCID) injection 20 mg  Once      Route: Intravenous  Ordered Dose: 20 mg     Last MAR action: Given Nakaiya Beddow E    01/30/20 0221 01/30/20 0220  promethazine (PHENERGAN) 12.5 mg in sodium chloride 0.9 % 50 mL IVPB  Once     Route: Intravenous  Ordered Dose: 12.5 mg     Last MAR action: Given Dravin Lance E    01/30/20 0221 01/30/20 0220  ondansetron (ZOFRAN) injection 4 mg  Once     Route: Intravenous  Ordered Dose: 4 mg     Last MAR action: Given Jo-Ann Johanning E    01/30/20 0209 01/30/20 0208  ondansetron (ZOFRAN) injection 4 mg  Once     Route: Intravenous  Ordered Dose: 4 mg     Last MAR action: Given Asjah Rauda E    01/30/20 0150 01/30/20 0149  lactated ringers bolus 1,000 mL  Once     Route: Intravenous  Ordered Dose: 1,000 mL     Last MAR action: Stopped SPONAUGLE, CHRISTOPHER BRIAN            Procedures:            Interpretations:      MDM:     Pulse Ox Analysis interpreted by me is  98 %% on RA nl without need for supplementation       Labs:I have reviewed and interpreted the labs at the time of visit. Dr Valere Dross            Cardiac Monitor Analysis interpreted by me - Normal Sinus Rhythm at rate of 69      DDX: Gastritis, enteritis,  hyperemesis cannabinoid, SBO    Clinical Course in the ED:      Patient nontoxic-appearing.  Laboratories benign.  Persisting vomiting continuing additional medications.  If continues poor tolerance we will plan on admit for supportive care. dw pt wishes to try more meds and fluids prior to decision to admit  So to oncoming physician         Discharge Prescriptions     None                This care is provided during an unprecedented national emergency due to the Novel Coronavirus (COVID-19). COVID-19 infections and transmission risks place heavy strains on healthcare resources. As this pandemic evolves, the Hospital and providers strive to respond fluidly, to remain operational, and to provide care relative to available resources and information. Outcomes are unpredictable and treatments are  without well-defined guidelines. Further, the impact of COVID-19 on all aspects of emergency care, including the impact to patients seeking care for reasons other than COVID-19, is unavoidable during this national emergency        *This note was generated by the Epic EMR system/ Dragon speech recognition and may contain inherent errors or omissions not intended by the user. Grammatical errors, random word insertions, deletions, pronoun errors and incomplete sentences are occasional consequences of this technology due to software limitations. Not all errors are caught or corrected. If there are questions or concerns about the content of this note or information contained within the body of this dictation they should be addressed directly with the author for clarification.*            RESULTS        Lab Results:      Results     Procedure Component Value Units Date/Time    UA Reflex to Micro - Reflex to Culture [[id number]]  (Abnormal) Collected: 01/30/20 0509     Updated: 01/30/20 0544     Urine Type Urine, Clean Ca     Color, UA Yellow     Clarity, UA Hazy     Specific Gravity UA 1.021     Urine pH 6.0     Leukocyte Esterase, UA Negative     Nitrite, UA Negative     Protein, UR 100     Glucose, UA 150     Ketones UA >=80     Urobilinogen, UA Normal mg/dL      Bilirubin, UA Negative     Blood, UA Negative     RBC, UA 0 - 2 /hpf      WBC, UA 0 - 5 /hpf      Squamous Epithelial Cells, Urine 0 - 5 /hpf     POCT Pregnancy Test, Urine HCG [[id number]] Collected: 01/30/20 0511     Updated: 01/30/20 0514     POCT QC Pass     POCT Pregnancy HCG Test, UR Negative     Comment: Negative Value is Normal in Healthy Males or Healthy non-pregnant Females    CBC and differential [[id number]] Collected: 01/30/20 0200    Specimen: Blood Updated: 01/30/20 0237     WBC 7.08 x10 3/uL      Hgb 14.5 g/dL      Hematocrit 03.4 %      Platelets 262 x10 3/uL      RBC 4.74 x10 6/uL      MCV 91.8 fL      MCH 30.6 pg      MCHC 33.3 g/dL  RDW 13  %      MPV 9.9 fL      Neutrophils 74.5 %      Lymphocytes Automated 19.1 %      Monocytes 5.4 %      Eosinophils Automated 0.0 %      Basophils Automated 0.7 %      Immature Granulocytes 0.3 %      Nucleated RBC 0.0 /100 WBC      Neutrophils Absolute 5.28 x10 3/uL      Lymphocytes Absolute Automated 1.35 x10 3/uL      Monocytes Absolute Automated 0.38 x10 3/uL      Eosinophils Absolute Automated 0.00 x10 3/uL      Basophils Absolute Automated 0.05 x10 3/uL      Immature Granulocytes Absolute 0.02 x10 3/uL      Absolute NRBC 0.00 x10 3/uL     GFR [[id number]] Collected: 01/30/20 0200     Updated: 01/30/20 0237     EGFR >60.0    Magnesium [[id number]] Collected: 01/30/20 0200     Updated: 01/30/20 0237     Magnesium 1.9 mg/dL     Comprehensive metabolic panel [[id number]]  (Abnormal) Collected: 01/30/20 0200    Specimen: Blood Updated: 01/30/20 0237     Glucose 136 mg/dL      BUN 8.0 mg/dL      Creatinine 0.8 mg/dL      Sodium 086 mEq/L      Potassium 4.3 mEq/L      Chloride 100 mEq/L      CO2 22 mEq/L      Calcium 9.9 mg/dL      Protein, Total 7.7 g/dL      Albumin 4.6 g/dL      AST (SGOT) 17 U/L      ALT 12 U/L      Alkaline Phosphatase 64 U/L      Bilirubin, Total 0.4 mg/dL      Globulin 3.1 g/dL      Albumin/Globulin Ratio 1.5     Anion Gap 13.0    Lipase [[id number]] Collected: 01/30/20 0200    Specimen: Blood Updated: 01/30/20 0237     Lipase 16 U/L     Magnesium [[id number]] Collected: 01/30/20 0200    Specimen: Blood Updated: 01/30/20 0200              Radiology Results:      No orders to display               Scribe Attestation:      I was acting as a Neurosurgeon for Carmon Sails, MD on Bridget [name]     I am the first provider for this patient and I personally performed the services documented.  is scribing for me on Dimercurio,Bridget [name]. This note accurately reflects work and decisions made by me.  Carmon Sails, MD          Carmon Sails, MD  01/30/20 [phone]

## 2020-01-30 NOTE — ED Notes (Signed)
[place], MD at bedside.

## 2020-01-30 NOTE — ED Triage Notes (Signed)
Pt. Reports nausea and vomiting that started this AM and has not been able to stop or keep anything down all day. Pt. Denies any fevers, chills, CP or SOB.

## 2020-01-30 NOTE — Progress Notes (Signed)
Adult Observation Progress Note      Shift Note:     General: AOX4 IN BED, C/O NAUSEA, NO VOMITING NOTED SINCE ADMITTED TO FLOOR. NOT ABLE TO TOLERATE PO INTAKE ON CLD. CONTINUOUS FLUIDS RUNNING.   Neuro: WNL  Cardio: WNL   Resp: WNL ROOM AIR   Integ: WNL  MSK: WNL  GI: NAUSEA  GU: WNL  IV Access: C/D/I     BM during shift: NO    Pending Orders:NONE     Discharge Plan:TBD     Social/Family Visits:mom    POC update: Patient and mom      Vitals:    01/30/20 1500 01/30/20 1531 01/30/20 1600 01/30/20 1634   BP: (!) 145/93 143/71 141/89 108/74   Pulse: 72 73 81 72   Resp:    18   Temp:   98.7 F (37.1 C) 98.5 F (36.9 C)   TempSrc:   Oral    SpO2: 100% 100% 100% 98%   Weight:    49 kg (108 lb)   Height:    1.626 m (5\' 4" )       Patient Lines/Drains/Airways Status    Active Lines, Drains and Airways     Name:   Placement date:   Placement time:   Site:   Days:    Peripheral IV 01/30/20 20 G Right Antecubital   01/30/20    0212    Antecubital   less than 1

## 2020-01-30 NOTE — Progress Notes (Signed)
01/30/20 1219   CM Review   Acknowledgment of Outpatient/Observation Observation letter given

## 2020-01-30 NOTE — Consults (Signed)
GASTROHEALTH  CONSULTATION NOTE  [facility] call: [phone], [id number]  [facility] call: [phone]  [facility] call: [id number]  After hours call [phone]        Date Time: 01/30/20 7:53 PM  Patient Name: Bridget [name]  Requesting Physician: Jerral Ralph, MD       Reason for Consultation:   Nausea and Vomiting    Assessment and Plan:   Assessment:  1. Nausea and Vomiting- GES normal in June 2019. EGD in 2019 notable for reflux. Possible etiologies include cannabinoid hyperemesis syndrome, gastroenteritis, or GERD.    2. Ehlers-Danlos Syndrome  3. POTS    Plan:  1. Recommend scheduled antiemetics  2. PPI IV BID  3. Clear liquid diet and advance diet as tolerated.   4. Reglan 10 mg TID      History:   Bridget [name] is a 24 y.o. female with a history of EDS and POTS who presents to the hospital on 01/30/2020 with nausea and vomiting. Her mother reports nausea and vomiting onset about two to three weeks ago and has gotten progressively worse. She denies any medication diet changes or alcohol use.     She has a history of intermittent episodes of nausea and vomiting previously evaluated by our GI team outpatient. She had an EGD and GES in June 2019. GES was normal and EGD showed evidence of GERD.  previous improved with Reglan 10 mg PRN. Her initial onset of nausea and vomiting started two after having an IUD placed, and her second episode after drinking alcohol.     On admission, her labs including CBC, CMP and lipase were normal. Reglan was administered in the ED without relief.  She was found to be cannabinoid positive on admission. Denies abdominal pain, melena, hematemesis, fever or chills      Past Medical History:     Past Medical History:   Diagnosis Date   . Abdominal pain    . Asthma     mild seasonal controlled   . Complication of anesthesia    . Post-operative nausea and vomiting    . POTS (postural orthostatic tachycardia syndrome)     [facility]  [place]/stable for many yrs   . Sinus trouble     seasonal   . Yeast infection involving the vagina and surrounding area 05/09/2015       Past Surgical History:     Past Surgical History:   Procedure Laterality Date   . ADENOIDECTOMY     . EGD N/A 01/23/2018    Procedure: EGD;  Surgeon: Jacqulyn Cane, MD;  Location: [facility];  Service: Gastroenterology;  Laterality: N/A;  EGD  Q1=N/A   . TONSILLECTOMY     . WISDOM TOOTH EXTRACTION         Family History:     Family History   Problem Relation Age of Onset   . Cancer Mother    . Ehlers-Danlos syndrome Sister    . Asthma Brother        Social History:     Social History     Socioeconomic History   . Marital status: Single     Spouse name: Not on file   . Number of children: Not on file   . Years of education: Not on file   . Highest education level: Not on file   Occupational History   . Not on file   Tobacco Use   . Smoking status: Never Smoker   . Smokeless tobacco:  Never Used   . Tobacco comment: the patient vapes nicotine    Vaping Use   . Vaping Use: Every day   . Start date: 04/14/2018   Substance and Sexual Activity   . Alcohol use: Not Currently   . Drug use: Yes     Comment: Pt. uses marijuana every day. Pt. reports not using it for the past 3 days.   . Sexual activity: Never     Comment: takes the Pill continuously   Other Topics Concern   . Poor school performance No   . International travel in past 12 mos No   Social History Narrative   . Not on file     Social Determinants of Health     Financial Resource Strain: Low Risk    . Difficulty of Paying Living Expenses: Not hard at all   Food Insecurity: No Food Insecurity   . Worried About Programme researcher, broadcasting/film/video in the Last Year: Never true   . Ran Out of Food in the Last Year: Never true   Transportation Needs: No Transportation Needs   . Lack of Transportation (Medical): No   . Lack of Transportation (Non-Medical): No   Physical Activity:    . Days of Exercise per Week:    . Minutes of Exercise per  Session:    Stress:    . Feeling of Stress :    Social Connections:    . Frequency of Communication with Friends and Family:    . Frequency of Social Gatherings with Friends and Family:    . Attends Religious Services:    . Active Member of Clubs or Organizations:    . Attends Banker Meetings:    Marland Kitchen Marital Status:    Intimate Partner Violence:    . Fear of Current or Ex-Partner:    . Emotionally Abused:    Marland Kitchen Physically Abused:    . Sexually Abused:        Allergies:     Allergies   Allergen Reactions   . Latex Itching     Itching irritation swelling condoms  Itching irritation swelling condoms  Itching irritation swelling condoms       Medications:     Current Facility-Administered Medications   Medication Dose Route Frequency   . [START ON 01/31/2020] cetirizine  10 mg Oral Daily   . enoxaparin  40 mg Subcutaneous Daily   . metoclopramide  10 mg Intravenous 4 times per day   . [START ON 01/31/2020] pantoprazole  40 mg Intravenous Q12H   . [START ON 01/31/2020] NON-FORMULARY PAT OWN MED order form  1 tablet Oral Daily       Review of Systems:   General:  Patient denies lack of appetite, night sweats, weight loss, fatigue, fever.   HEENT:  Patient denies headache, hoarseness   Cardiovascular:  Patient denies swelling of hands/feet, fainting/blacking out, chest pain.   Respiratory:  Patient denies chronic cough, difficulty breathing, wheezing.   Genitourinary:  Patient denies blood in urine, dark urine  Musculoskeletal: Patient denies joint pain, joint stiffness, joint swelling.   Skin:  Patient denies itching, rash.   Neurologic:  Patient denies dizziness, loss of consciousness, fainting, confusion  Heme/Lymphatic:  Patient denies easy bruising.       Pertinent positives noted in HPI.    Physical Exam:     Vitals:    01/30/20 1913   BP: 131/82   Pulse: 74   Resp: 18   Temp: 98.1  F (36.7 C)   SpO2: 100%       General appearance: Well developed, well nourished, appears stated age and in NAD  Eyes: Sclera  anicteric, pink conjunctivae, no ptosis  ENMT: mucous membranes moist, nose and ears appear normal.  Oropharynx clear.  Chest: Non labored respirations, no audible wheezing, no clubbing or cyanosis  CV:  Regular rate and rhythm, no JVD, no LE edema  Abdomen: soft, non-tender, non-distended, no masses or organomegaly  Skin: Normal color and turgor, no rashes, no suspicious skin lesions noted  Neuro: CN II-XII grossly intact.  No gross movement disorders noted.  Mental status: Appropriate affect, alert and oriented x 3    Labs Reviewed:     Recent Labs     01/30/20  0200   WBC 7.08   Hgb 14.5   Hematocrit 43.5   Platelets 262   MCV 91.8       Recent Labs     01/30/20  0200   Sodium 135*   Potassium 4.3   Chloride 100   CO2 22   BUN 8.0   Creatinine 0.8   Glucose 136*   Calcium 9.9   Magnesium 1.9       Recent Labs     01/30/20  0200   AST (SGOT) 17   ALT 12   Alkaline Phosphatase 64   Bilirubin, Total 0.4   Protein, Total 7.7   Albumin 4.6       No results for input(s): PTT, PT, INR in the last 72 hours.     Radiology:   Radiological Procedure reviewed:    EGD, 01/23/2018, (BT) Z-line regular, 36cm from the incisors (bx: reflux). 1cm hiatal hernia. Gastritis (bx: normal). Normal duodenum.     GES, 01/30/2018, Normal.

## 2020-01-30 NOTE — Plan of Care (Signed)
Adult Observation Progress Note    Shift note: Patient received to shift at 1900. Bedside report received from outgoing RN.  Introduced self to patient and white board updated.  VSS and A&Ox4. Pt reports nausea at this time. Pt rates pain 0/10. IV in place, NaCl infusing @ 4ml/hr. Tele not in order per orders. Fall level low with safety precautions in place. Will continue to monitor.     BM during shift:  Yes/No: No  If no; Date of Last BM: 01/30/20    Pending Orders: CLD, reglan q6, zofran PRN, possible [place] if symptoms improve    Discharge Plan:  TBD    Social/Family Visits:  N/A    POC update: Patient updated on plan of care, verbalized understanding.       Vitals:    01/30/20 1531 01/30/20 1600 01/30/20 1634 01/30/20 1913   BP: 143/71 141/89 108/74 131/82   Pulse: 73 81 72 74   Resp:   18 18   Temp:  98.7 F (37.1 C) 98.5 F (36.9 C) 98.1 F (36.7 C)   TempSrc:  Oral  Oral   SpO2: 100% 100% 98% 100%   Weight:   49 kg (108 lb)    Height:   1.626 m (5\' 4" )        Patient Lines/Drains/Airways Status      Active Lines, Drains and Airways       Name:   Placement date:   Placement time:   Site:   Days:    Peripheral IV 01/30/20 20 G Right Antecubital   01/30/20    0212    Antecubital   less than 1           Inactive Lines, Drains and Airways       None                     Problem: Safety  Goal: Patient will be free from injury during hospitalization  Outcome: Progressing  Flowsheets (Taken 01/30/2020 1940)  Patient will be free from injury during hospitalization:   Assess patient's risk for falls and implement fall prevention plan of care per policy   Provide and maintain safe environment   Use appropriate transfer methods   Ensure appropriate safety devices are available at the bedside   Include patient/ family/ care giver in decisions related to safety   Hourly rounding  Goal: Patient will be free from infection during hospitalization  Outcome: Progressing  Flowsheets (Taken 01/30/2020 1940)  Free from Infection  during hospitalization:   Assess and monitor for signs and symptoms of infection   Monitor lab/diagnostic results   Monitor all insertion sites (i.e. indwelling lines, tubes, urinary catheters, and drains)     Problem: Pain  Goal: Pain at adequate level as identified by patient  Outcome: Progressing  Flowsheets (Taken 01/30/2020 1940)  Pain at adequate level as identified by patient:   Identify patient comfort function goal   Assess for risk of opioid induced respiratory depression, including snoring/sleep apnea. Alert healthcare team of risk factors identified.   Assess pain on admission, during daily assessment and/or before any "as needed" intervention(s)   Reassess pain within 30-60 minutes of any procedure/intervention, per Pain Assessment, Intervention, Reassessment (AIR) Cycle   Evaluate patient's satisfaction with pain management progress   Evaluate if patient comfort function goal is met   Offer non-pharmacological pain management interventions     Problem: Side Effects from Pain Analgesia  Goal: Patient will experience minimal  side effects of analgesic therapy  Outcome: Progressing  Flowsheets (Taken 01/30/2020 1940)  Patient will experience minimal side effects of analgesic therapy:   Monitor/assess patient's respiratory status (RR depth, effort, breath sounds)   Assess for changes in cognitive function   Prevent/manage side effects per LIP orders (i.e. nausea, vomiting, pruritus, constipation, urinary retention, etc.)     Problem: Discharge Barriers  Goal: Patient will be discharged home or other facility with appropriate resources  Outcome: Progressing  Flowsheets (Taken 01/30/2020 1940)  Discharge to home or other facility with appropriate resources:   Provide appropriate patient education   Provide information on available health resources   Initiate discharge planning     Problem: Psychosocial and Spiritual Needs  Goal: Demonstrates ability to cope with hospitalization/illness  Outcome:  Progressing  Flowsheets (Taken 01/30/2020 1940)  Demonstrates ability to cope with hospitalizations/illness:   Encourage verbalization of feelings/concerns/expectations   Provide quiet environment   Encourage patient to set small goals for self   Assist patient to identify own strengths and abilities   Encourage participation in diversional activity     Problem: Altered GI Function  Goal: Fluid and electrolyte balance are achieved/maintained  Outcome: Progressing  Flowsheets (Taken 01/30/2020 1940)  Fluid and electrolyte balance are achieved/maintained:   Monitor intake and output every shift   Monitor/assess lab values and report abnormal values   Assess for confusion/personality changes   Provide adequate hydration   Observe for seizure activity and initiate seizure precautions if indicated   Observe for cardiac arrhythmias   Monitor for muscle weakness   Assess and reassess fluid and electrolyte status  Goal: Elimination patterns are normal or improving  Outcome: Progressing  Flowsheets (Taken 01/30/2020 1940)  Elimination patterns are normal or improving:   Anticipate/assist with toileting needs   Assess for normal bowel sounds   Report abnormal assessment to physician   Monitor for abdominal distension   Monitor for abdominal discomfort   Assess for signs and symptoms of bleeding.  Report signs of bleeding to physician   Administer treatments as ordered   Administer medications to improve bowel evacuation as prescribed   Encourage /perform oral hygiene as appropriate   Reinforce education on foods that improve and complicate bowel movements and how activity and medications can affect bowel movements   Consult/collaborate with Clinical Nutritionist  Goal: Nutritional intake is adequate  Outcome: Progressing  Flowsheets (Taken 01/30/2020 1940)  Nutritional intake is adequate:   Monitor daily weights   Encourage/perform oral hygiene as appropriate   Assist patient with meals/food selection   Allow adequate time for  meals   Encourage/administer dietary supplements as ordered (i.e. tube feed, TPN, oral, OGT/NGT, supplements)   Include patient/patient care companion in decisions related to nutrition   Assess anorexia, appetite, and amount of meal/food tolerated  Goal: Mobility/Activity is maintained at optimal level for patient  Outcome: Progressing  Flowsheets (Taken 01/30/2020 1940)  Mobility/activity is maintained at optimal level for patient:   Increase mobility as tolerated/progressive mobility   Encourage independent activity per ability   Perform active/passive ROM   Maintain proper body alignment   Plan activities to conserve energy, plan rest periods   Reposition patient every 2 hours and as needed unless able to reposition self   Assess for changes in respiratory status, level of consciousness and/or development of fatigue  Goal: No bleeding  Outcome: Progressing  Flowsheets (Taken 01/30/2020 1940)  No bleeding:   Monitor and assess vitals and hemodynamic parameters   Monitor/assess  lab values and report abnormal values   Assess for bruising/petechia

## 2020-01-30 NOTE — ED Notes (Signed)
Called pharmacy to question status of phenergan getting sent up, states they will prepare and send up shortly.

## 2020-01-30 NOTE — Progress Notes (Signed)
NURSING ADMISSION NOTE    Date Time: 01/30/20 4:59 PM  Patient Name: Bridget [name]  Attending Physician: Jerral Ralph, MD    Date of Admission:   01/30/2020    Reason for Admission:   Intractable vomiting with nausea [R11.2]  Nausea and vomiting, intractability of vomiting not specified, unspecified vomiting type [R11.2]    Admitted from:   ED    Nursing Note:   Patient AOx4, English speaking, and VSS.No Tele order. Oriented patient to the floor and POC. Bed to low position. Call bell within reach.     Patient scores as low/mod/high: Low Falls risk. Patient ambulates: independently with: no assistance.  Bed alarm on and floor mats in place. Patient belongings reviewed, home medications birth control present and returned to family. Patient skin inatct, No wounds present , 4 eyes in 4 hours with RN Marcelino Duster. Patient denies any pain at this time, 0/10. Pt does c/o nausea, PRN medication given.    Active Lines and Wounds:      Patient Lines/Drains/Airways Status    Active Lines, Drains and Airways     Name:   Placement date:   Placement time:   Site:   Days:    Peripheral IV 01/30/20 20 G Right Antecubital   01/30/20    0212    Antecubital   less than 1                   Vitals:    01/30/20 1500 01/30/20 1531 01/30/20 1600 01/30/20 1634   BP: (!) 145/93 143/71 141/89 108/74   Pulse: 72 73 81 72   Resp:    18   Temp:   98.7 F (37.1 C) 98.5 F (36.9 C)   TempSrc:   Oral    SpO2: 100% 100% 100% 98%   Weight:    49 kg (108 lb)   Height:    1.626 m (5\' 4" )

## 2020-01-30 NOTE — ED Notes (Signed)
[facility] EMERGENCY DEPT  ED NURSING NOTE FOR THE RECEIVING INPATIENT NURSE   ED NURSE Alex/Abby/Zoe   [facility] [id number]   ED CHARGE RN Velna Hatchet   ADMISSION INFORMATION   Bridget [name] is a 24 y.o. female admitted with an ED diagnosis of:    1. Nausea and vomiting, intractability of vomiting not specified, unspecified vomiting type    2. Intractable vomiting with nausea         Isolation: None   Allergies: Latex   Holding Orders confirmed? N/A   Belongings Documented? Yes   Home medications sent to pharmacy confirmed? N/A   NURSING CARE   Patient Comes From:   Mental Status: Home Independent  alert and oriented   ADL: Independent with all ADLs   Ambulation: mild difficulty   Pertinent Information  and Safety Concerns: vomiting since 10 am patient reports increased vomiting throughout the night. patient reports hx of pots, intractable vomiting.      CT / NIH   CT Head ordered on this patient?  N/A   NIH/Dysphagia assessment done prior to admission? N/A   VITAL SIGNS (at the time of this note)      Vitals:    01/30/20 1153   BP: 137/87   Pulse: 74   Resp:    Temp:    SpO2: 100%

## 2020-01-31 DIAGNOSIS — R112 Nausea with vomiting, unspecified: Secondary | ICD-10-CM | POA: Diagnosis present

## 2020-01-31 LAB — BASIC METABOLIC PANEL
Anion Gap: 12 (ref 5.0–15.0)
BUN: 4 mg/dL — ABNORMAL LOW (ref 7.0–19.0)
CO2: 19 mEq/L — ABNORMAL LOW (ref 22–29)
Calcium: 8.4 mg/dL — ABNORMAL LOW (ref 8.5–10.5)
Chloride: 104 mEq/L (ref 100–111)
Creatinine: 0.7 mg/dL (ref 0.6–1.0)
Glucose: 80 mg/dL (ref 70–100)
Potassium: 3.8 mEq/L (ref 3.5–5.1)
Sodium: 135 mEq/L — ABNORMAL LOW (ref 136–145)

## 2020-01-31 LAB — GFR: EGFR: >60

## 2020-01-31 LAB — MAGNESIUM: Magnesium: 1.6 mg/dL (ref 1.6–2.6)

## 2020-01-31 MED ORDER — ACETAMINOPHEN 650 MG RE SUPP
650.00 mg | Freq: Four times a day (QID) | RECTAL | Status: DC | PRN
Start: 2020-01-31 — End: 2020-02-01

## 2020-01-31 MED ORDER — PROCHLORPERAZINE EDISYLATE 10 MG/2ML IJ SOLN
10.00 mg | INTRAMUSCULAR | Status: DC | PRN
Start: 2020-01-31 — End: 2020-01-31
  Administered 2020-01-31: 14:00:00 10 mg via INTRAVENOUS
  Filled 2020-01-31: qty 2

## 2020-01-31 MED ORDER — ACETAMINOPHEN 325 MG PO TABS
650.0000 mg | ORAL_TABLET | Freq: Four times a day (QID) | ORAL | Status: DC | PRN
Start: 2020-01-31 — End: 2020-01-31

## 2020-01-31 MED ORDER — MAGNESIUM SULFATE IN D5W 1-5 GM/100ML-% IV SOLN
1.0000 g | INTRAVENOUS | Status: AC
Start: 2020-01-31 — End: 2020-01-31
  Administered 2020-01-31 (×2): 1 g via INTRAVENOUS
  Filled 2020-01-31: qty 100

## 2020-01-31 MED ORDER — PROMETHAZINE HCL 25 MG/ML IJ SOLN
12.50 mg | Freq: Four times a day (QID) | INTRAMUSCULAR | Status: DC | PRN
Start: 2020-01-31 — End: 2020-02-01
  Administered 2020-01-31 – 2020-02-01 (×2): 12.5 mg via INTRAVENOUS
  Filled 2020-01-31: qty 1
  Filled 2020-01-31 (×2): qty 0.5

## 2020-01-31 MED ORDER — ACETAMINOPHEN 325 MG PO TABS
650.0000 mg | ORAL_TABLET | Freq: Four times a day (QID) | ORAL | Status: DC | PRN
Start: 2020-01-31 — End: 2020-02-01
  Administered 2020-01-31: 650 mg via ORAL

## 2020-01-31 MED ORDER — ACETAMINOPHEN 650 MG RE SUPP
650.00 mg | Freq: Four times a day (QID) | RECTAL | Status: DC | PRN
Start: 2020-01-31 — End: 2020-01-31

## 2020-01-31 NOTE — Plan of Care (Signed)
Medicine Note    12;12pm. Called mom/Bridget [name] @ [phone] - rings to voicemail. Next called [phone] and rings to an automated voicemail.

## 2020-01-31 NOTE — Plan of Care (Signed)
Adult Observation Progress Note     Shift note: Patient received to shift at 1900. Bedside report received from outgoing RN.  Introduced self to patient and white board updated.  VSS and A&Ox4. Pt reports nausea at this time. Pt rates pain 0/10. IV in place, NaCl infusing @ 39ml/hr. Tele not in order per orders. Fall level low with safety precautions in place. Will continue to monitor.     BM during shift:  Yes/No: No  If no; Date of Last BM: 01/30/20     Pending Orders: CLD (advance as tolerated), reglan q6, Phenergan PRN, possible [place] if symptoms improve     Discharge Plan:  TBD     Social/Family Visits:  N/A     POC update: Patient updated on plan of care, verbalized understanding.     Vitals:    01/31/20 0324 01/31/20 0724 01/31/20 1127 01/31/20 1709   BP: 100/59 99/65 123/83 134/83   Pulse: 67 83 77 69   Resp: 18 16 16 18    Temp: 98.1 F (36.7 C) 98.1 F (36.7 C) 98.1 F (36.7 C) 98.1 F (36.7 C)   TempSrc: Oral Oral Oral Oral   SpO2: 96% 98% 98% 100%   Weight:       Height:           Patient Lines/Drains/Airways Status      Active Lines, Drains and Airways       Name:   Placement date:   Placement time:   Site:   Days:    Peripheral IV 01/30/20 20 G Right Antecubital   01/30/20    0212    Antecubital   1           Inactive Lines, Drains and Airways       None                     Problem: Safety  Goal: Patient will be free from injury during hospitalization  Outcome: Progressing  Flowsheets (Taken 01/30/2020 1940)  Patient will be free from injury during hospitalization:   Assess patient's risk for falls and implement fall prevention plan of care per policy   Provide and maintain safe environment   Use appropriate transfer methods   Ensure appropriate safety devices are available at the bedside   Include patient/ family/ care giver in decisions related to safety   [place] rounding  Goal: Patient will be free from infection during hospitalization  Outcome: Progressing  Flowsheets (Taken 01/30/2020 1940)  Free  from Infection during hospitalization:   Assess and monitor for signs and symptoms of infection   Monitor lab/diagnostic results   Monitor all insertion sites (i.e. indwelling lines, tubes, urinary catheters, and drains)     Problem: Pain  Goal: Pain at adequate level as identified by patient  Outcome: Progressing  Flowsheets (Taken 01/30/2020 1940)  Pain at adequate level as identified by patient:   Identify patient comfort function goal   Assess for risk of opioid induced respiratory depression, including snoring/sleep apnea. Alert healthcare team of risk factors identified.   Assess pain on admission, during daily assessment and/or before any "as needed" intervention(s)   Reassess pain within 30-60 minutes of any procedure/intervention, per Pain Assessment, Intervention, Reassessment (AIR) Cycle   Evaluate patient's satisfaction with pain management progress   Evaluate if patient comfort function goal is met   Offer non-pharmacological pain management interventions     Problem: Side Effects from Pain Analgesia  Goal: Patient will experience  minimal side effects of analgesic therapy  Outcome: Progressing  Flowsheets (Taken 01/30/2020 1940)  Patient will experience minimal side effects of analgesic therapy:   Monitor/assess patient's respiratory status (RR depth, effort, breath sounds)   Assess for changes in cognitive function   Prevent/manage side effects per LIP orders (i.e. nausea, vomiting, pruritus, constipation, urinary retention, etc.)     Problem: Discharge Barriers  Goal: Patient will be discharged home or other facility with appropriate resources  Outcome: Progressing  Flowsheets (Taken 01/30/2020 1940)  Discharge to home or other facility with appropriate resources:   Provide appropriate patient education   Provide information on available health resources   Initiate discharge planning     Problem: Psychosocial and Spiritual Needs  Goal: Demonstrates ability to cope with hospitalization/illness  Outcome:  Progressing  Flowsheets (Taken 01/30/2020 1940)  Demonstrates ability to cope with hospitalizations/illness:   Encourage verbalization of feelings/concerns/expectations   Provide quiet environment   Encourage patient to set small goals for self   Assist patient to identify own strengths and abilities   Encourage participation in diversional activity     Problem: Altered GI Function  Goal: Fluid and electrolyte balance are achieved/maintained  Outcome: Progressing  Flowsheets (Taken 01/30/2020 1940)  Fluid and electrolyte balance are achieved/maintained:   Monitor intake and output every shift   Monitor/assess lab values and report abnormal values   Assess for confusion/personality changes   Provide adequate hydration   Observe for seizure activity and initiate seizure precautions if indicated   Observe for cardiac arrhythmias   Monitor for muscle weakness   Assess and reassess fluid and electrolyte status  Goal: Elimination patterns are normal or improving  Outcome: Progressing  Flowsheets (Taken 01/30/2020 1940)  Elimination patterns are normal or improving:   Anticipate/assist with toileting needs   Assess for normal bowel sounds   Report abnormal assessment to physician   Monitor for abdominal distension   Monitor for abdominal discomfort   Assess for signs and symptoms of bleeding.  Report signs of bleeding to physician   Administer treatments as ordered   Administer medications to improve bowel evacuation as prescribed   Encourage /perform oral hygiene as appropriate   Reinforce education on foods that improve and complicate bowel movements and how activity and medications can affect bowel movements   Consult/collaborate with Clinical Nutritionist  Goal: Nutritional intake is adequate  Outcome: Progressing  Flowsheets (Taken 01/30/2020 1940)  Nutritional intake is adequate:   Monitor daily weights   Encourage/perform oral hygiene as appropriate   Assist patient with meals/food selection   Allow adequate time for  meals   Encourage/administer dietary supplements as ordered (i.e. tube feed, TPN, oral, OGT/NGT, supplements)   Include patient/patient care companion in decisions related to nutrition   Assess anorexia, appetite, and amount of meal/food tolerated  Goal: Mobility/Activity is maintained at optimal level for patient  Outcome: Progressing  Flowsheets (Taken 01/30/2020 1940)  Mobility/activity is maintained at optimal level for patient:   Increase mobility as tolerated/progressive mobility   Encourage independent activity per ability   Perform active/passive ROM   Maintain proper body alignment   Plan activities to conserve energy, plan rest periods   Reposition patient every 2 hours and as needed unless able to reposition self   Assess for changes in respiratory status, level of consciousness and/or development of fatigue  Goal: No bleeding  Outcome: Progressing  Flowsheets (Taken 01/30/2020 1940)  No bleeding:   Monitor and assess vitals and hemodynamic parameters  Monitor/assess lab values and report abnormal values   Assess for bruising/petechia

## 2020-01-31 NOTE — Progress Notes (Signed)
MEDICINE PROGRESS NOTE    Date Time: 01/31/20 12:39 PM  Patient Name: Bridget [name]  Attending Physician: Jerral Ralph, MD    CC: Nausea     Assessment:   Hospital Course  Bridget [name] is a 24 y.o. female with a PMHx of POTs, [name] cannabinoid use, admitted 01/30/2020 to observation for intractable Nausea, vomiting and abdominal pain. On admission labs notable for unremarkable CBC, CMP, Lipase 16, UA negative, urine tox + cannabinoids.  Abdominal xray no acute process.  Patient started on anti-emetic regimen and IV Protonix.  GI consulted no inpatient testing recommended.     Interval History:  6/17:  Nausea despite IV Protonix and Reglan use.  Zofran discontinued and started on Campizine IV.       Plan:   # Nausea, Vomiting likely cannabinoid hyperemesis syndrome  # Abdominal pain   # Cannabinoid positive    KUB no acute process    GI consulted and no inpatient testing recommended    Continue with Reglan 10 mg TID    Continue with Protonix 40 mg BID   On 6/17 Zofran discontinued patient stated it is not working for her    Started on Compazine 10 mg IV PRN    Clear liquid diet, advance as tolerated     #POTS  #[place]       Expected [place]: Likely [place] in AM  DVT ppx: LOVENOX  Interpreter: no, not indicated  Code Status: FULL CODE  Diet: Clear Liquid    Please see attending note that follows this mid-level encounter note.   Review of Systems:   Review of Systems   Constitutional: Negative.    HENT: Negative.    Respiratory: Negative.    Cardiovascular: Negative.    Gastrointestinal: Positive for nausea and vomiting.   Genitourinary: Negative.    Musculoskeletal: Negative.    Neurological: Negative.    Endo/Heme/Allergies: Negative.    Psychiatric/Behavioral: Negative.      Physical Exam:   Physical Exam  VITAL SIGNS PHYSICAL EXAM   Temp:  [98.1 F (36.7 C)-98.7 F (37.1 C)] 98.1 F (36.7 C)  Heart Rate:  [67-83] 77  Resp Rate:  [16-18] 16  BP: (99-145)/(59-93) 123/83  Blood  Glucose:    Telemetry:     No intake or output data in the 24 hours ending 01/31/20 1239 Physical Exam  General: awake, alert X 3  Cardiovascular: regular rate and rhythm, no murmurs, rubs or gallops  Lungs: clear to auscultation bilaterally, without wheezing, rhonchi, or rales  Abdomen: soft, non-tender, non-distended; no palpable masses,  normoactive bowel sounds  Extremities: no edema          Meds:     Current Facility-Administered Medications   Medication Dose Route Frequency   . cetirizine  10 mg Oral Daily   . enoxaparin  40 mg Subcutaneous Daily   . metoclopramide  10 mg Intravenous 4 times per day   . pantoprazole  40 mg Intravenous Q12H   . NON-FORMULARY PAT OWN MED order form  1 tablet Oral Daily     Labs:     Recent Labs     01/30/20  0200   WBC 7.08   Hgb 14.5   Hematocrit 43.5   Platelets 262   MCV 91.8     Recent Labs     01/31/20  0333 01/30/20  0200   Sodium 135* 135*   Potassium 3.8 4.3   Chloride 104 100   CO2 19*  22   BUN 4.0* 8.0   Creatinine 0.7 0.8   Glucose 80 136*   Calcium 8.4* 9.9   Magnesium 1.6 1.9     Recent Labs     01/30/20  0200   AST (SGOT) 17   ALT 12   Alkaline Phosphatase 64   Protein, Total 7.7   Albumin 4.6     No results for input(s): PTT, PT, INR in the last 72 hours.  Imaging personally reviewed.    Safety Checklist:     DVT prophylaxis:  CHEST guideline (See page [phone]) Chemical   Foley:  [place] Rn Foley protocol Not present   IVs:  Peripheral IV   PT/OT: Ordered   Daily CBC & or Chem ordered:  SHM/ABIM guidelines (see #5) No   Reference for approximate charges of common labs: CBC auto diff - $76  BMP - $99  Mg - $79  Disposition:   Today's date: 01/31/2020  Admit Date: 01/30/2020  1:59 AM  Anticipated medical stability for discharge:[place] - maybe tomorrow  Service status: Observation:patient is stable and improving.  Reason for ongoing hospitalization: N, V  Anticipated discharge needs: TBD  I have discussed with Attending: Jerral Ralph, MD  Signed by: Morley Kos,  FNP  Adult Observation Unit Nurse Practitioner  [phone] or [phone] on NT6   I have Reviewed the interval history, images and pertinent test results and personally examined the patient and confirmed the major physical findings of the preceding mid-levels note. Agree with above

## 2020-01-31 NOTE — Consults (Signed)
Chaplain Service      Assessment:  Spiritual Assessment: Unable to assess (comment) (Pt. sleeping )    Background:  Visit Type: Initial was made by Chaplain with patient, Bridget [name], based on Source: Nurse Referral.  Present at Visit: patient.  Spiritual Care Provided to: patient only.  Length of Visit: 0-15 minutes .    Summary:  Reason for Request: Spiritual Assessment   Spiritual Care Interventions: Provided Chaplaincy education   Spiritual Care Outcomes: Unavailable    Chaplain attempted to visit the patient this morning per consult request. Patient was not seen secondary to appearing asleep at the time of the visit. Chaplain left note at patient door, and will attempt to see patient at another time.     Rev. Shaune Leeks, MDiv, [facility]  Staff Chaplain  Desk: [phone]  Pager: [phone]  [email or web address]    For emergencies page [phone]

## 2020-01-31 NOTE — UM Notes (Signed)
24 YEAR OLD FEMALE CAME TO [facility] ER WITH C/O INTRACTABLE NAUSEA AND VOMITING. HX OF EHLERS-DANLOS AND POTS. TAKE ZOFRAN REGULARLY, BUT SX INCREASED AND HAS NOT BEEN ABLE TO KEEP ANYTHING DOWN SINCE YESTERDAY.    VS- BP- 131/81, P- 59, R- 21, T- 98.4  LOP 3/10    EKG-   NORMAL SINUS RHYTHM   WITH SINUS ARRHYTHMIA   NORMAL ECG   WHEN COMPARED WITH ECG OF   07-Jan-2018 14:43,   NO SIGNIFICANT CHANGE WAS FOUND         LABS- NA 135, BUN 4.0, CANNABINOID POSITIVE    AXR-Abdomen is a most entirely gasless. No obvious bowel obstruction.     PMH- ASTHMA, POTS, SINUS TROUBLE, YEAST INFECTION, ADENOIDECTOMY, TONSILLECTOMY, WISDOM TOOTH EXTRACTION    PLAN- ADMIT TO OBSERVATION CLASS 01/30/2020 1156    CHANGED TO INPATIENT CLASS 01/31/2020 1344    ATTENDING-  6/17:  Nausea despite IV Protonix and Reglan use.  Zofran discontinued and started on Campizine IV.   Plan:  - patient does not think zofran is working --> d/c  - continue reglan IV scheduled  - instead do compazine 10 IV for breakthrough nausea  - discussed with pt (mom brought this up) - IF compazine not effective, will next trial ativan 0.25mg  IV 12 PRN (she received 0.5 IV in the ER and that very much sedated her on exam)  - continue clear liquid diet, continue IVF  - discussed with patient - goals for discharge are ingestion of more than just liquids and must stay down (anticipate tomorrow)  Full Code      Admit to Inpatient (Order #[id number]) on 01/31/20    Vomiting - Clinical Indications for Admission to Inpatient Care    UTILIZATION REVIEW CONTACT: Name: PAM Massie Cogliano  Clinical Case Manager  - Utilization Review  [facility]  Address:  [address] [place], [place]  [address]  NPI:   [id number]  Tax ID:  [id number]  Phone: [phone]  Fax: [phone]    Please use fax number [phone] to provide authorization for hospital services or to request additional information.        NOTES TO REVIEWER:     This clinical review is based on/compiled from  documentation provided by the treatment team within the patient's medical record.

## 2020-02-01 LAB — GFR: EGFR: >60

## 2020-02-01 LAB — BASIC METABOLIC PANEL
Anion Gap: 15 (ref 5.0–15.0)
BUN: 3 mg/dL — ABNORMAL LOW (ref 7.0–19.0)
CO2: 17 mEq/L — ABNORMAL LOW (ref 22–29)
Calcium: 8.4 mg/dL — ABNORMAL LOW (ref 8.5–10.5)
Chloride: 102 mEq/L (ref 100–111)
Creatinine: 0.7 mg/dL (ref 0.6–1.0)
Glucose: 80 mg/dL (ref 70–100)
Potassium: 3.7 mEq/L (ref 3.5–5.1)
Sodium: 134 mEq/L — ABNORMAL LOW (ref 136–145)

## 2020-02-01 MED ORDER — METOCLOPRAMIDE HCL 10 MG PO TABS
10.00 mg | ORAL_TABLET | Freq: Three times a day (TID) | ORAL | 0 refills | Status: AC
Start: 2020-02-01 — End: 2020-02-15

## 2020-02-01 MED ORDER — PROMETHAZINE HCL 12.5 MG PO TABS
12.5000 mg | ORAL_TABLET | Freq: Four times a day (QID) | ORAL | 0 refills | Status: DC | PRN
Start: 2020-02-01 — End: 2020-02-01

## 2020-02-01 MED ORDER — PANTOPRAZOLE SODIUM 40 MG PO TBEC
40.00 mg | DELAYED_RELEASE_TABLET | Freq: Every day | ORAL | 0 refills | Status: DC
Start: 2020-02-01 — End: 2020-02-10

## 2020-02-01 MED ORDER — PROMETHAZINE HCL 12.5 MG RE SUPP
12.50 mg | Freq: Four times a day (QID) | RECTAL | 0 refills | Status: DC | PRN
Start: 2020-02-01 — End: 2020-02-10

## 2020-02-01 MED ORDER — METOCLOPRAMIDE HCL 10 MG PO TABS
10.0000 mg | ORAL_TABLET | Freq: Three times a day (TID) | ORAL | 0 refills | Status: DC
Start: 2020-02-01 — End: 2020-02-01

## 2020-02-01 MED ORDER — PROMETHAZINE HCL 12.5 MG PO TABS
12.5000 mg | ORAL_TABLET | Freq: Four times a day (QID) | ORAL | 0 refills | Status: DC | PRN
Start: 2020-02-01 — End: 2020-02-10

## 2020-02-01 MED ORDER — METOCLOPRAMIDE HCL 10 MG PO TABS
10.0000 mg | ORAL_TABLET | Freq: Three times a day (TID) | ORAL | Status: DC
Start: 2020-02-01 — End: 2020-02-01
  Administered 2020-02-01: 10:00:00 10 mg via ORAL
  Filled 2020-02-01: qty 1

## 2020-02-01 MED ORDER — PANTOPRAZOLE SODIUM 40 MG PO TBEC
40.00 mg | DELAYED_RELEASE_TABLET | Freq: Every day | ORAL | 0 refills | Status: DC
Start: 2020-02-01 — End: 2020-02-01

## 2020-02-01 NOTE — Progress Notes (Signed)
Adult Observation Progress Note      Shift Note:     General: Patient not displaying any signs of distress. States that she feels better today and is "able to hold down water." Patient denies any pain, Nausea and/or vomiting today.   Neuro: A&Ox4  Cardio: WDL  Resp: WDL clr on RA   Integ: Intact   MSK: WDL   GI: Nausea and Vomiting resolved, patient feeling better today   GU: WDL  IV Access: 20h RAC      BM during shift: No    Pending Orders: GI symptom management, discharge to home     Discharge Plan:To home with mother    Social/Family Visits: None    POC update: Yes patient and mother       Vitals:    02/01/20 0055 02/01/20 0439 02/01/20 0937 02/01/20 1058   BP: 141/88 144/82 133/83 134/87   Pulse: 64 77 (!) 59 69   Resp: 18 18 18 16    Temp: 98.8 F (37.1 C) 98.3 F (36.8 C) 98.4 F (36.9 C) 98.6 F (37 C)   TempSrc: Oral Oral Oral Oral   SpO2: 96% 96% 99% 99%   Weight:       Height:           Patient Lines/Drains/Airways Status    Active Lines, Drains and Airways     None

## 2020-02-01 NOTE — Progress Notes (Signed)
ADULT OBSERVATION UNIT  DISCHARGE NOTE      Date Time: 02/01/20 12:51 PM  Patient Name: Bridget [name]  Attending Physician: No att. providers found      Date of Admission:   01/30/2020        Discharge Instructions:        Patient stable for discharge. Discharge instructions given, patient teachings done and verbalized understanding. All questions answered at this time. Aware to follow-up and schedule appointments as necessary. IV access and cardiac monitor discontinued. Discharge to home with mother. Wheelchair Transport requested.    Patient aware to follow-up with MD and PMD as needed.    Signed by: Ula Lingo

## 2020-02-01 NOTE — Progress Notes (Signed)
Case Management   Discharge Checklist    Discharge Plan:   February 01, 2020   Bridget [name]     Discharge Destination: home, self-care  Transport plan: family    [place] plan confirmed with: patient and nursing.        02/01/20 1257   Discharge Disposition   Physical Discharge Disposition Home   Mode of Transportation Car   Patient/Family/POA notified of transfer plan Patient informed only   Patient agreeable to discharge plan/expected d/c date? Yes   Bedside nurse notified of transport plan? Yes   CM Interventions   Follow up appointment scheduled? No   Reason no follow up scheduled? Family to schedule   Referral made for home health RN visit? Does not meet home bound criteria   Multidisciplinary rounds/family meeting before d/c? Yes   Medicare Checklist   Is this a Medicare patient? No     Bridget [name], BSN, Charity fundraiser, [employer]  NT 6 Case Manager  [facility]  [phone]

## 2020-02-01 NOTE — Discharge Summary (Signed)
MEDICINE DISCHARGE SUMMARY    Date Time: 02/01/20 10:27 AM  Patient Name: Bridget [name]  Attending Physician: Jerral Ralph, MD  Primary Care Physician: Lind Covert, MD    Date of Admission: 01/30/2020  Date of Discharge: 01/31/2020    Discharge Diagnoses:   # Nausea, Vomiting  Gastritis vs cannabinoid hyperemesis syndrome   # Cannabinoid use   #POTS   #[place]       Disposition:      Home with family    Pending Results, Recommendations & Instructions to providers after discharge:     1. Micro / Labs / Path pending:   Unresulted Labs     Procedure . . . Date/Time    Magnesium [[id number]] Collected: 01/30/20 0200    Specimen: Blood Updated: 01/30/20 0200          Recent Labs:       Results     Procedure Component Value Units Date/Time    Basic Metabolic Panel [[id number]]  (Abnormal) Collected: 02/01/20 0045    Specimen: Blood Updated: 02/01/20 0136     Glucose 80 mg/dL      BUN 3.0 mg/dL      Creatinine 0.7 mg/dL      Calcium 8.4 mg/dL      Sodium 875 mEq/L      Potassium 3.7 mEq/L      Chloride 102 mEq/L      CO2 17 mEq/L      Anion Gap 15.0    GFR [[id number]] Collected: 02/01/20 0045     Updated: 02/01/20 0136     EGFR >60.0    GFR [[id number]] Collected: 01/31/20 0333     Updated: 01/31/20 0433     EGFR >60.0    Basic Metabolic Panel [[id number]]  (Abnormal) Collected: 01/31/20 0333    Specimen: Blood Updated: 01/31/20 0433     Glucose 80 mg/dL      BUN 4.0 mg/dL      Creatinine 0.7 mg/dL      Calcium 8.4 mg/dL      Sodium 557 mEq/L      Potassium 3.8 mEq/L      Chloride 104 mEq/L      CO2 19 mEq/L      Anion Gap 12.0    Magnesium [[id number]] Collected: 01/31/20 0333    Specimen: Blood Updated: 01/31/20 0433     Magnesium 1.6 mg/dL     Rapid drug screen, urine [[id number]]  (Abnormal) Collected: 01/30/20 1312    Specimen: Urine Updated: 01/30/20 1344     Urine Amphetamine Screen Negative     Barbiturate Screen, UR Negative     Benzodiazepine Screen, UR Negative     Cannabinoid Screen, UR Positive      Cocaine, UR Negative     Opiate Screen, UR Negative     PCP Screen, UR Negative    UA Reflex to Micro - Reflex to Culture [[id number]]  (Abnormal) Collected: 01/30/20 0509     Updated: 01/30/20 0544     Urine Type Urine, Clean Ca     Color, UA Yellow     Clarity, UA Hazy     Specific Gravity UA 1.021     Urine pH 6.0     Leukocyte Esterase, UA Negative     Nitrite, UA Negative     Protein, UR 100     Glucose, UA 150     Ketones UA >=80     Urobilinogen, UA Normal mg/dL  Bilirubin, UA Negative     Blood, UA Negative     RBC, UA 0 - 2 /hpf      WBC, UA 0 - 5 /hpf      Squamous Epithelial Cells, Urine 0 - 5 /hpf     POCT Pregnancy Test, Urine HCG [[id number]] Collected: 01/30/20 0511     Updated: 01/30/20 0514     POCT QC Pass     POCT Pregnancy HCG Test, UR Negative     Comment: Negative Value is Normal in Healthy Males or Healthy non-pregnant Females    CBC and differential [[id number]] Collected: 01/30/20 0200    Specimen: Blood Updated: 01/30/20 0237     WBC 7.08 x10 3/uL      Hgb 14.5 g/dL      Hematocrit 53.6 %      Platelets 262 x10 3/uL      RBC 4.74 x10 6/uL      MCV 91.8 fL      MCH 30.6 pg      MCHC 33.3 g/dL      RDW 13 %      MPV 9.9 fL      Neutrophils 74.5 %      Lymphocytes Automated 19.1 %      Monocytes 5.4 %      Eosinophils Automated 0.0 %      Basophils Automated 0.7 %      Immature Granulocytes 0.3 %      Nucleated RBC 0.0 /100 WBC      Neutrophils Absolute 5.28 x10 3/uL      Lymphocytes Absolute Automated 1.35 x10 3/uL      Monocytes Absolute Automated 0.38 x10 3/uL      Eosinophils Absolute Automated 0.00 x10 3/uL      Basophils Absolute Automated 0.05 x10 3/uL      Immature Granulocytes Absolute 0.02 x10 3/uL      Absolute NRBC 0.00 x10 3/uL     GFR [[id number]] Collected: 01/30/20 0200     Updated: 01/30/20 0237     EGFR >60.0    Magnesium [[id number]] Collected: 01/30/20 0200     Updated: 01/30/20 0237     Magnesium 1.9 mg/dL     Comprehensive metabolic panel [[id number]]  (Abnormal) Collected:  01/30/20 0200    Specimen: Blood Updated: 01/30/20 0237     Glucose 136 mg/dL      BUN 8.0 mg/dL      Creatinine 0.8 mg/dL      Sodium 416 mEq/L      Potassium 4.3 mEq/L      Chloride 100 mEq/L      CO2 22 mEq/L      Calcium 9.9 mg/dL      Protein, Total 7.7 g/dL      Albumin 4.6 g/dL      AST (SGOT) 17 U/L      ALT 12 U/L      Alkaline Phosphatase 64 U/L      Bilirubin, Total 0.4 mg/dL      Globulin 3.1 g/dL      Albumin/Globulin Ratio 1.5     Anion Gap 13.0    Lipase [[id number]] Collected: 01/30/20 0200    Specimen: Blood Updated: 01/30/20 0237     Lipase 16 U/L     Magnesium [[id number]] Collected: 01/30/20 0200    Specimen: Blood Updated: 01/30/20 0200          Procedures/Radiology performed:   Radiology: all results from this admission  XR Abdomen  Portable    Result Date: 01/30/2020   Abdomen is a most entirely gasless. No obvious bowel obstruction. Colonel Bald, MD  01/30/2020 1:37 PM       Hospital Course:     Reason for admission/ HPI: Nausea, Vomiting.      Hospital Course: Bridget [name] is a 24 y.o. female with a PMHx of POTs, [name] cannabinoid use, admitted 01/30/2020 to observation for intractable Nausea, vomiting and abdominal pain. On admission labs notable for unremarkable CBC, CMP, Lipase 16, UA negative, urine tox + cannabinoids.  Abdominal xray no acute process.  GI consulted no inpatient testing recommended. Patient started on anti-emetic regimen ( Phenergan, Reglan )  and IV Protonix which helped.  This morning patient stated that no nausea nor vomiting overnight and able to tolerate PO.  Discharged with a prescription of Protonix, Phenergan po/suppository, and Reglan.     Instructions for after your discharge:  -- Schedule a follow up visit with [facility] ([facility]) if symptoms are not improved over the next several days  -- Diet: Until you are feeling better, please stick to a brat diet. Please also stay well hydrated  -- You should avoid alcohol and other drugs until  you are feeling back to normal   -- Take phenergan Po/ suppository as needed for nausea   - Take Reglan three times daily   - Take Protonix 40 mg daily for two weeks then you can take Pepcid which is over the counter    Discharge Day Exam:  Temp:  [98.1 F (36.7 C)-99.2 F (37.3 C)] 98.4 F (36.9 C)  Heart Rate:  [59-83] 59  Resp Rate:  [16-18] 18  BP: (123-144)/(82-89) 133/83  Physical Exam  Constitutional:       Appearance: Normal appearance.   HENT:      Head: Normocephalic and atraumatic.      Mouth/Throat:      Mouth: Mucous membranes are moist.   Cardiovascular:      Rate and Rhythm: Normal rate and regular rhythm.   Pulmonary:      Effort: Pulmonary effort is normal.      Breath sounds: Normal breath sounds.   Abdominal:      General: Abdomen is flat. Bowel sounds are normal.      Palpations: Abdomen is soft.   Musculoskeletal:         General: Normal range of motion.      Cervical back: Normal range of motion and neck supple.   Skin:     General: Skin is warm and dry.   Neurological:      General: No focal deficit present.      Mental Status: She is alert and oriented to person, place, and time.   Psychiatric:         Mood and Affect: Mood normal.         Behavior: Behavior normal.         Thought Content: Thought content normal.         Judgment: Judgment normal.       Wounds/decutibus ulcers/stage:    Consultations:     Treatment Team:   Attending Provider: Jerral Ralph, MD  Consulting Physician: Jerral Ralph, MD  Consulting Physician: Lestine Mount, MD    Discharge Condition:     Stable     Discharge Instructions & Follow Up Plan for Patient:     Diet: Cardiac diet     Activity/Weight Bearing Status:NA  Patient was instructed to follow up with:     Follow-up Information     Karp,Evan Follow up.    Contact information:  [phone]           [facility] [facility] Follow up.    Contact information:  [address]  [place] [address]   [phone]           [facility] ([facility] Follow up.    Specialty: Gastroenterology  Contact information:  [address]  [place] [address]  [phone]                 Discharge Medications:        Discharge Medication List      Taking    metoclopramide 10 MG tablet  Dose: 10 mg  What changed: when to take this  Commonly known as: REGLAN  Take 1 tablet (10 mg total) by mouth 3 (three) times daily before meals for 14 days     norethindrone-ethinyl estradiol 1-35 MG-MCG per tablet  Dose: 1 tablet  Commonly known as: ORTHO-NOVUM 1/35  Take 1 tablet by mouth daily     pantoprazole 40 MG tablet  Dose: 40 mg  Commonly known as: PROTONIX  Take 1 tablet (40 mg total) by mouth daily for 14 days     * promethazine 12.5 MG tablet  Dose: 12.5 mg  Commonly known as: PHENERGAN  Take 1 tablet (12.5 mg total) by mouth every 6 (six) hours as needed for Nausea     * promethazine 12.5 MG suppository  Dose: 12.5 mg  Commonly known as: PHENERGAN  Place 1 suppository (12.5 mg total) rectally every 6 (six) hours as needed for Nausea         * This list has 2 medication(s) that are the same as other medications prescribed for you. Read the directions carefully, and ask your doctor or other care provider to review them with you.            STOP taking these medications    ondansetron 4 MG disintegrating tablet  Commonly known as: Zofran ODT              (FYI: you must refresh the link after final D/C Med reconciliation)    Immunizations provided:         [facility]  Department of Medicine  P: [phone]  F: [phone]    Signed by: Morley Kos, NP  Plan of care discussed with Dr. Joni Fears, [place] Bouchard    CC: Lind Covert, MD

## 2020-02-01 NOTE — Progress Notes (Signed)
CASE MANAGEMENT IDPA OBS and Low Risk      Patient: Bridget [name] (24 y.o. female)  Admission Date: 01/30/2020 [facility] Day 1)    Active Hospital Problems    Diagnosis   . Nausea and vomiting, intractability of vomiting not specified, unspecified vomiting type   . Intractable vomiting with nausea       Length of stay: Hospital Day 1    Discussed plan of care and possible discharge needs with patient/family/care-giver (Y, N) - if family/care-giver, provide name: yes, met with patient at bedside    Patient has cognitive ability to participate in care decisions and follow-up care as needed (Y, N) - provide comments if no: yes    Plan of care:   Discharge Plan A: home, self-care  Discharge Plan B:     Barriers to discharge: none              a) Barriers needing escalation:               b) Escalated to:     Verified (and corrected if needed) demographics and PCP on facesheet (Y, N) yes -- PCP updated to Dr. Edwyna Perfect per patient.     Patient is NOT identified as a Medicare focused patient or high risk for failed discharge plan or readmission as detailed in the High Risk Patient Screening policy (Y, N) yes    Comments: Patient lives at home w/ her parents and sister. She states she is fully independent with activities of daily living, is able to perform all household chores, uses no medical equipment, and is able to drive for appointments/errands. She declines needs at this time. She states that her mother or father will pick her up at discharge.     Case management will continue to follow for any change in discharge needs.    Anastasia Pall, BSN, RN, ACM  NT 6 OBS Case Manager  Case Management   [facility]  [phone]

## 2020-02-05 ENCOUNTER — Emergency Department: Payer: No Typology Code available for payment source

## 2020-02-05 ENCOUNTER — Emergency Department
Admission: EM | Admit: 2020-02-05 | Discharge: 2020-02-05 | Disposition: A | Discharge status: Home / Self Care | Payer: No Typology Code available for payment source | Attending: Emergency Medical Services | Admitting: Emergency Medical Services

## 2020-02-05 DIAGNOSIS — Z9089 Acquired absence of other organs: Secondary | ICD-10-CM | POA: Insufficient documentation

## 2020-02-05 DIAGNOSIS — R112 Nausea with vomiting, unspecified: Secondary | ICD-10-CM | POA: Insufficient documentation

## 2020-02-05 DIAGNOSIS — R1084 Generalized abdominal pain: Secondary | ICD-10-CM | POA: Insufficient documentation

## 2020-02-05 LAB — CBC AND DIFFERENTIAL
Absolute NRBC: 0 10*3/uL (ref 0.00–0.00)
Basophils Absolute Automated: 0.05 10*3/uL (ref 0.00–0.08)
Basophils Automated: 0.6 %
Eosinophils Absolute Automated: 0.01 10*3/uL (ref 0.00–0.44)
Eosinophils Automated: 0.1 %
Hematocrit: 49.1 % — ABNORMAL HIGH (ref 34.7–43.7)
Hgb: 17 g/dL — ABNORMAL HIGH (ref 11.4–14.8)
Immature Granulocytes Absolute: 0.04 10*3/uL (ref 0.00–0.07)
Immature Granulocytes: 0.5 %
Lymphocytes Absolute Automated: 1.84 10*3/uL (ref 0.42–3.22)
Lymphocytes Automated: 21.6 %
MCH: 30.3 pg (ref 25.1–33.5)
MCHC: 34.6 g/dL (ref 31.5–35.8)
MCV: 87.5 fL (ref 78.0–96.0)
MPV: 9.5 fL (ref 8.9–12.5)
Monocytes Absolute Automated: 0.45 10*3/uL (ref 0.21–0.85)
Monocytes: 5.3 %
Neutrophils Absolute: 6.12 10*3/uL (ref 1.10–6.33)
Neutrophils: 71.9 %
Nucleated RBC: 0 /100 WBC (ref 0.0–0.0)
Platelets: 344 10*3/uL (ref 142–346)
RBC: 5.61 10*6/uL — ABNORMAL HIGH (ref 3.90–5.10)
RDW: 13 % (ref 11–15)
WBC: 8.51 10*3/uL (ref 3.10–9.50)

## 2020-02-05 LAB — URINALYSIS, REFLEX TO MICROSCOPIC EXAM IF INDICATED
Bilirubin, UA: NEGATIVE
Glucose, UA: NEGATIVE
Ketones UA: 20 — AB
Leukocyte Esterase, UA: NEGATIVE
Nitrite, UA: NEGATIVE
Protein, UR: NEGATIVE
Specific Gravity UA: >1.035 — AB (ref 1.001–1.035)
Urine pH: 6 (ref 5.0–8.0)
Urobilinogen, UA: NORMAL mg/dL (ref 0.2–2.0)

## 2020-02-05 LAB — HEPATIC FUNCTION PANEL
ALT: 17 U/L (ref 0–55)
AST (SGOT): 14 U/L (ref 5–34)
Albumin/Globulin Ratio: 1.5 (ref 0.9–2.2)
Albumin: 4.7 g/dL (ref 3.5–5.0)
Alkaline Phosphatase: 65 U/L (ref 37–106)
Bilirubin Direct: 0.4 mg/dL (ref 0.0–0.5)
Bilirubin Indirect: 0.5 mg/dL (ref 0.2–1.0)
Bilirubin, Total: 0.9 mg/dL (ref 0.2–1.2)
Globulin: 3.2 g/dL (ref 2.0–3.6)
Protein, Total: 7.9 g/dL (ref 6.0–8.3)

## 2020-02-05 LAB — BASIC METABOLIC PANEL
Anion Gap: 21 — ABNORMAL HIGH (ref 5.0–15.0)
BUN: 15 mg/dL (ref 7–19)
CO2: 23 mEq/L (ref 22–29)
Calcium: 10.4 mg/dL (ref 8.5–10.5)
Chloride: 94 mEq/L — ABNORMAL LOW (ref 100–111)
Creatinine: 1 mg/dL (ref 0.6–1.0)
Glucose: 94 mg/dL (ref 70–100)
Potassium: 4.1 mEq/L (ref 3.5–5.1)
Sodium: 138 mEq/L (ref 136–145)

## 2020-02-05 LAB — HCG, SERUM, QUALITATIVE: Hcg Qualitative: NEGATIVE

## 2020-02-05 LAB — GFR: EGFR: >60

## 2020-02-05 LAB — LIPASE: Lipase: 14 U/L (ref 8–78)

## 2020-02-05 MED ORDER — SODIUM CHLORIDE 0.9 % IV BOLUS
1000.00 mL | Freq: Once | INTRAVENOUS | Status: AC
Start: 2020-02-05 — End: 2020-02-05
  Administered 2020-02-05: 15:00:00 1000 mL via INTRAVENOUS

## 2020-02-05 MED ORDER — KETOROLAC TROMETHAMINE 15 MG/ML IJ SOLN
15.00 mg | Freq: Once | INTRAMUSCULAR | Status: AC
Start: 2020-02-05 — End: 2020-02-05
  Administered 2020-02-05: 15:00:00 15 mg via INTRAVENOUS
  Filled 2020-02-05: qty 1

## 2020-02-05 MED ORDER — FAMOTIDINE 20 MG PO TABS
20.00 mg | ORAL_TABLET | Freq: Every evening | ORAL | 0 refills | Status: AC
Start: 2020-02-05 — End: 2020-02-10

## 2020-02-05 MED ORDER — MORPHINE SULFATE 2 MG/ML IJ/IV SOLN (WRAP)
2.0000 mg | Freq: Once | Status: AC
Start: 2020-02-05 — End: 2020-02-05
  Administered 2020-02-05: 13:00:00 2 mg via INTRAVENOUS
  Filled 2020-02-05: qty 1

## 2020-02-05 MED ORDER — IOHEXOL 350 MG/ML IV SOLN
100.00 mL | Freq: Once | INTRAVENOUS | Status: AC | PRN
Start: 2020-02-05 — End: 2020-02-05
  Administered 2020-02-05: 14:00:00 100 mL via INTRAVENOUS

## 2020-02-05 MED ORDER — ONDANSETRON HCL 4 MG/2ML IJ SOLN
4.00 mg | Freq: Once | INTRAMUSCULAR | Status: AC
Start: 2020-02-05 — End: 2020-02-05
  Administered 2020-02-05: 12:00:00 4 mg via INTRAVENOUS
  Filled 2020-02-05: qty 2

## 2020-02-05 MED ORDER — FAMOTIDINE 10 MG/ML IV SOLN (WRAP)
20.00 mg | Freq: Once | INTRAVENOUS | Status: AC
Start: 2020-02-05 — End: 2020-02-05
  Administered 2020-02-05: 12:00:00 20 mg via INTRAVENOUS
  Filled 2020-02-05: qty 2

## 2020-02-05 MED ORDER — SODIUM CHLORIDE 0.9 % IV BOLUS
1000.00 mL | Freq: Once | INTRAVENOUS | Status: AC
Start: 2020-02-05 — End: 2020-02-05
  Administered 2020-02-05: 12:00:00 1000 mL via INTRAVENOUS

## 2020-02-05 MED ORDER — PANTOPRAZOLE SODIUM 40 MG PO TBEC
40.00 mg | DELAYED_RELEASE_TABLET | Freq: Every morning | ORAL | 0 refills | Status: DC
Start: 2020-02-05 — End: 2020-02-10

## 2020-02-05 MED ORDER — SODIUM CHLORIDE 0.9 % IV SOLN
12.5000 mg | Freq: Once | INTRAVENOUS | Status: AC
Start: 2020-02-05 — End: 2020-02-05
  Administered 2020-02-05: 12:00:00 12.5 mg via INTRAVENOUS
  Filled 2020-02-05: qty 1

## 2020-02-05 MED ORDER — PROMETHAZINE HCL 25 MG RE SUPP
25.00 mg | Freq: Four times a day (QID) | RECTAL | 1 refills | Status: DC | PRN
Start: 2020-02-05 — End: 2020-02-10

## 2020-02-05 NOTE — ED Notes (Signed)
Patient requesting ice chips, okay'd per provider. Ice chips provided

## 2020-02-05 NOTE — ED Triage Notes (Signed)
d/c'd on friday for intractable vomiting. hx of pots and gastroparesis. +nausea and dry heaving.

## 2020-02-05 NOTE — ED Provider Notes (Signed)
EMERGENCY DEPARTMENT HISTORY AND PHYSICAL EXAM      [facility]       Patient Information     Patient Name: Bridget [name], Bridget [name]  Encounter Date:  02/05/2020  Patient DOB:  07/03/1996  MRN:  [id number]  Room:  22/B22  Rendering Provider: Jimmy Picket FNP-BC     History of Presenting Illness     Chief Complaint: epigastric  Historian: self  Onset: chronic  Quality: aching  Location: abdominal  Duration: itnermittent      HPI Comments:   24 y.o. female pertinent history of injectable abdominal pain, intractable nausea and vomiting, asthma, pots.  Patient presents to emergency department planing of intractable nausea and vomiting, dry heaving in triage.  Patient reports generalized abdominal pain that is attributed by the patient to actable nausea and vomiting lack of appetite as well as generalized abdominal cramping.  Patient reports LMP February 03, 2020 that normally attributes to patient's baseline nausea and abdominal discomfort.  Per mother at baseline patient has chronic nausea and vomiting      PMD: Wynetta Fines, MD    Past Medical History     Past Medical History:   Diagnosis Date   . Abdominal pain    . Asthma     mild seasonal controlled   . Complication of anesthesia    . Post-operative nausea and vomiting    . POTS (postural orthostatic tachycardia syndrome)     [facility]/stable for many yrs   . Sinus trouble     seasonal   . Yeast infection involving the vagina and surrounding area 05/09/2015       Past Surgical History     Past Surgical History:   Procedure Laterality Date   . ADENOIDECTOMY     . EGD N/A 01/23/2018    Procedure: EGD;  Surgeon: Jacqulyn Cane, MD;  Location: [facility];  Service: Gastroenterology;  Laterality: N/A;  EGD  Q1=N/A   . TONSILLECTOMY     . WISDOM TOOTH EXTRACTION         Family History     Family History   Problem Relation Age of Onset   . Cancer Mother    . Ehlers-Danlos syndrome Sister    . Asthma Brother         Social History     Social History     Socioeconomic History   . Marital status: Single     Spouse name: Not on file   . Number of children: Not on file   . Years of education: Not on file   . Highest education level: Not on file   Occupational History   . Not on file   Tobacco Use   . Smoking status: Never Smoker   . Smokeless tobacco: Never Used   . Tobacco comment: the patient vapes nicotine    Vaping Use   . Vaping Use: Every day   . Start date: 04/14/2018   Substance and Sexual Activity   . Alcohol use: Not Currently   . Drug use: Yes     Comment: Pt. uses marijuana every day. Pt. reports not using it for the past 3 days.   . Sexual activity: Never     Comment: takes the Pill continuously   Other Topics Concern   . Poor school performance No   . International travel in past 12 mos No   Social History Narrative   . Not on file  Social Determinants of Health     Financial Resource Strain: Low Risk    . Difficulty of Paying Living Expenses: Not hard at all   Food Insecurity: No Food Insecurity   . Worried About Programme researcher, broadcasting/film/video in the Last Year: Never true   . Ran Out of Food in the Last Year: Never true   Transportation Needs: No Transportation Needs   . Lack of Transportation (Medical): No   . Lack of Transportation (Non-Medical): No   Physical Activity:    . Days of Exercise per Week:    . Minutes of Exercise per Session:    Stress:    . Feeling of Stress :    Social Connections:    . Frequency of Communication with Friends and Family:    . Frequency of Social Gatherings with Friends and Family:    . Attends Religious Services:    . Active Member of Clubs or Organizations:    . Attends Banker Meetings:    Marland Kitchen Marital Status:    Intimate Partner Violence:    . Fear of Current or Ex-Partner:    . Emotionally Abused:    Marland Kitchen Physically Abused:    . Sexually Abused:        Allergies     Allergies   Allergen Reactions   . Latex Itching     Itching irritation swelling condoms  Itching irritation  swelling condoms  Itching irritation swelling condoms       Home Medications     Prior to Admission medications    Medication Sig Start Date End Date Taking? Authorizing Provider   metoclopramide (REGLAN) 10 MG tablet Take 1 tablet (10 mg total) by mouth 3 (three) times daily before meals for 14 days 02/01/20 02/15/20  Morley Kos, FNP   norethindrone-ethinyl estradiol (ORTHO-NOVUM 1/35) 1-35 MG-MCG per tablet Take 1 tablet by mouth daily 09/17/19   Reynold Bowen, MD   pantoprazole (PROTONIX) 40 MG tablet Take 1 tablet (40 mg total) by mouth daily for 14 days 02/01/20 02/15/20  Abebe, Tinbit S, FNP   promethazine (PHENERGAN) 12.5 MG suppository Place 1 suppository (12.5 mg total) rectally every 6 (six) hours as needed for Nausea 02/01/20   Abebe, Tinbit S, FNP   promethazine (PHENERGAN) 12.5 MG tablet Take 1 tablet (12.5 mg total) by mouth every 6 (six) hours as needed for Nausea 02/01/20   Abebe, Tinbit S, FNP         Review of Systems     Review of Systems     Physical Exam     Patient Vitals for the past 24 hrs:   BP Temp Temp src Pulse Resp SpO2 Weight   02/05/20 1328 - - - 84 - 99 % -   02/05/20 1317 (!) 127/94 - - 91 - 99 % -   02/05/20 1225 - - - 90 - 97 % -   02/05/20 1132 (!) 123/93 - - 89 - 97 % -   02/05/20 1113 121/89 98.4 F (36.9 C) Oral (!) 125 16 96 % -   02/05/20 1108 - - - - - - 45.1 kg       Nursing note and vitals reviewed.  Constitutional:  Well developed, well nourished. Awake & Oriented x3.  Head:  Atraumatic. Normocephalic.    Cardiovascular:  Regular rate. Regular rhythm. No murmurs, rubs, or gallops.  Pulmonary/Chest:  No evidence of respiratory distress. Clear to auscultation bilaterally.  No wheezing, rales or  rhonchi.   Abdominal:  Soft and non-distended. There is generalized tenderness. No rebound, guarding, or rigidity.  Back:  Full ROM. Nontender.No CVA ttp  Extremities:  No edema. No cyanosis. No clubbing. Full range of motion in all extremities.  Skin:  Skin is warm and dry.  No  diaphoresis. No rash.   Neurological:  Alert, awake, and appropriate. Normal speech. Motor normal.  Psychiatric:  Good eye contact. Normal interaction, affect, and behavior.        Orders Placed During This Encounter     Orders Placed This Encounter   Procedures   . CT Abd/ Pelvis with IV Contrast   . Basic Metabolic Panel   . Lipase   . Hepatic function panel (LFT)   . UA with reflex to micro (all hospital ED's and [facility], pts 3 + yrs)   . Beta HCG, Qual, Serum   . GFR   . CBC and differential       ED Medications Administered     ED Medication Orders (From admission, onward)    Start Ordered     Status Ordering Provider    02/05/20 1450 02/05/20 1449  ketorolac (TORADOL) injection 15 mg  Once     Route: Intravenous  Ordered Dose: 15 mg     Last MAR action: Given Cira Servant    02/05/20 1447 02/05/20 1446  sodium chloride 0.9 % bolus 1,000 mL  Once     Route: Intravenous  Ordered Dose: 1,000 mL     Last MAR action: New Bag SUTINGCO, ALEXANDER-NICHOLAS D    02/05/20 1346 02/05/20 1346  iohexol (OMNIPAQUE) 350 MG/ML injection 100 mL  IMG once as needed     Route: Intravenous  Ordered Dose: 100 mL     Last MAR action: Imaging Agent Given SUTINGCO, ALEXANDER-NICHOLAS D    02/05/20 1254 02/05/20 1253  morphine injection 2 mg  Once     Route: Intravenous  Ordered Dose: 2 mg     Last MAR action: Given Cira Servant    02/05/20 1138 02/05/20 1137  promethazine (PHENERGAN) 12.5 mg in sodium chloride 0.9 % 50 mL IVPB  Once     Route: Intravenous  Ordered Dose: 12.5 mg     Last MAR action: Given Cira Servant    02/05/20 1122 02/05/20 1121  famotidine (PEPCID) injection 20 mg  Once     Route: Intravenous  Ordered Dose: 20 mg     Last MAR action: Given Cira Servant    02/05/20 1120 02/05/20 1119  sodium chloride 0.9 % bolus 1,000 mL  Once     Route: Intravenous  Ordered Dose: 1,000 mL     Last MAR action: Stopped Daneesha Quinteros M    02/05/20 1120 02/05/20 1119  ondansetron (ZOFRAN) injection  4 mg  Once     Route: Intravenous  Ordered Dose: 4 mg     Last MAR action: Given Synethia Endicott M          Diagnostic Study Results and Data Review     The results of the diagnostic studies below were reviewed by the ED provider:    Labs  Results     Procedure Component Value Units Date/Time    UA with reflex to micro (all hospital ED's and [facility], pts 3 + yrs) [[id number]]  (Abnormal) Collected: 02/05/20 1412    Specimen: Urine Updated: 02/05/20 1437     Urine Type Clean Catch     Color,  UA Yellow     Clarity, UA Clear     Specific Gravity UA >1.035     Urine pH 6.0     Leukocyte Esterase, UA Negative     Nitrite, UA Negative     Protein, UR Negative     Glucose, UA Negative     Ketones UA 20     Urobilinogen, UA Normal mg/dL      Bilirubin, UA Negative     Blood, UA Moderate     RBC, UA 0 - 2 /hpf      WBC, UA 0 - 5 /hpf      Squamous Epithelial Cells, Urine 0 - 5 /hpf     GFR [[id number]] Collected: 02/05/20 1212     Updated: 02/05/20 1236     EGFR >60.0    Lipase [[id number]] Collected: 02/05/20 1212    Specimen: Blood Updated: 02/05/20 1236     Lipase 14 U/L     Hepatic function panel (LFT) [[id number]] Collected: 02/05/20 1212    Specimen: Blood Updated: 02/05/20 1236     Bilirubin, Total 0.9 mg/dL      Bilirubin Direct 0.4 mg/dL      Bilirubin Indirect 0.5 mg/dL      AST (SGOT) 14 U/L      ALT 17 U/L      Alkaline Phosphatase 65 U/L      Protein, Total 7.9 g/dL      Albumin 4.7 g/dL      Globulin 3.2 g/dL      Albumin/Globulin Ratio 1.5    Basic Metabolic Panel [[id number]]  (Abnormal) Collected: 02/05/20 1212    Specimen: Blood Updated: 02/05/20 1236     Glucose 94 mg/dL      BUN 15 mg/dL      Creatinine 1.0 mg/dL      Calcium 62.9 mg/dL      Sodium 528 mEq/L      Potassium 4.1 mEq/L      Chloride 94 mEq/L      CO2 23 mEq/L      Anion Gap 21.0    Beta HCG, Qual, Serum [[id number]] Collected: 02/05/20 1212    Specimen: Blood Updated: 02/05/20 1229     Hcg Qualitative Negative    CBC and  differential [[id number]]  (Abnormal) Collected: 02/05/20 1212     Updated: 02/05/20 1219     WBC 8.51 x10 3/uL      Hgb 17.0 g/dL      Hematocrit 03.4 %      Platelets 344 x10 3/uL      RBC 5.61 x10 6/uL      MCV 87.5 fL      MCH 30.3 pg      MCHC 34.6 g/dL      RDW 13 %      MPV 9.5 fL      Neutrophils 71.9 %      Lymphocytes Automated 21.6 %      Monocytes 5.3 %      Eosinophils Automated 0.1 %      Basophils Automated 0.6 %      Immature Granulocytes 0.5 %      Nucleated RBC 0.0 /100 WBC      Neutrophils Absolute 6.12 x10 3/uL      Lymphocytes Absolute Automated 1.84 x10 3/uL      Monocytes Absolute Automated 0.45 x10 3/uL      Eosinophils Absolute Automated 0.01 x10 3/uL      Basophils Absolute Automated 0.05  x10 3/uL      Immature Granulocytes Absolute 0.04 x10 3/uL      Absolute NRBC 0.00 x10 3/uL           Radiologic Studies  Radiology Results (24 Hour)     Procedure Component Value Units Date/Time    CT Abd/ Pelvis with IV Contrast [[id number]] Collected: 02/05/20 1358    Order Status: Completed Updated: 02/05/20 1404    Narrative:      History: 24 year old female with pain.    COMPARISON: None.    TECHNIQUE: Spiral CT after IV contrast only. 100 cc Omnipaque 350  administered. Low radiation dose technique utilized.    FINDINGS:  The liver, spleen, pancreas, adrenal glands, kidneys, and biliary system  are unremarkable. No radiopaque calculi are seen. There is no  hydronephrosis or biliary duct dilatation. There is no localizing  lymphadenopathy or ascites. Abdominal aorta and IVC are normal caliber  and patent.    The stomach is normally distended. Small bowel is normal caliber without  thickening. The appendix is normal. Moderate air and stool is seen  throughout the colon. There is no detectable colitis, diverticulitis,  obstruction, or free air.    In the pelvis, the uterus is retroverted. The bladder is non-distended.  There is a small left pelvic phlebolith. There is no detectable mass or  fluid  collection. There are no significant hernias.    The bony structures are intact. The lung bases are clear.      Impression:        1. No detectable acute abnormalities.  2. No bowel obstruction or detectable bowel inflammation; normal  appendix.  3. Remainder as above.    Wilmon Pali, MD   02/05/2020 2:02 PM            Abnormal results/incidental findings discussed with pt and/or family: yes    Monitors, EKG, Procedures, Critical Care, and Splints          MDM and Clinical Notes     Working Differential (not completely inclusive): appe, chole, urolithiasis, pyelonephritis AKI, SBO, volvulus, mesenteric ischemia, constipation, diverticulitis, abscess, etc    Nursing records reviewed and agree: YES     Clinical Notes:     Patient with pertinent history of intractable abdominal pain, intractable nausea and vomiting, pots presents to emergency department for evaluation of intractable nausea vomiting as well as worsening abdominal pain since yesterday.  Of note patient was just released from the hospital on June 18 when she was admitted for the same.    WBC 8.5 normal  H&H 17/49 hemoconcentration  Platelets 344 normal  BMP normal  HCG negative  Lipase wnl    Re-Eval: Re-eval at 1300 patient appears to be resting quietly although complains of increasingly worsening abdominal pain and generalized tenderness.  I offered pain medication for relief of his symptoms, patient is agreeable to plan.  We discussed CT imaging of the abdomen to rule out surgical normality although clinically highly doubt.  Patient is interested to proceed with imaging because she is concerned this pain is "much worse that she normally getsMedical sales representative: At 1454 spoke with Mardene Celeste NP for Dr. Margot Chimes who recommends 40 mg of Protonix in a.m., 20 mg of Pepcid in p.m., eliminate CBD products, Phenergan suppositories for nausea vomiting as necessary.  Patient will be followed up by [facility] in the office    The patient is aware that this evaluation is  only a screening for emergent conditions related  to his or her symptoms and presentation. Discussed appropriate supportive care in addition to any prescriptions provided. Patient/Family were given opportunity to ask questions which were answered to the best of my ability. Patient/family were given strict return precautions and were agreeable to dispo plan.      Prescriptions       New Prescriptions    FAMOTIDINE (PEPCID) 20 MG TABLET    Take 1 tablet (20 mg total) by mouth every evening for 5 days    PANTOPRAZOLE (PROTONIX) 40 MG TABLET    Take 1 tablet (40 mg total) by mouth every morning before breakfast    PROMETHAZINE (PHENERGAN) 25 MG SUPPOSITORY    Place 1 suppository (25 mg total) rectally every 6 (six) hours as needed for Nausea (as needed for severe vomiting)       Diagnosis and Disposition     Clinical Impression:  1. Generalized abdominal pain    2. Intractable nausea and vomiting      Final diagnoses:   Generalized abdominal pain   Intractable nausea and vomiting       Disposition:  ED Disposition     None                  Rendering Provider: Jimmy Picket FNP-BC     Attending's signature signifies review of the provider note and clinical impression.      This note was generated by the [employer] [employer] EMR system/Dragon speech recognition and may contain inherent errors or omissions not intended by the user. Grammatical errors, random word insertions, deletions, pronoun errors and incomplete sentences are occasional consequences of this technology due to software limitations. Not all errors are caught or corrected. If there are questions or concerns about the content of this note or information contained within the body of this dictation they should be addressed directly with the author for clarification.         Cira Servant, FNP  02/05/20 1522       Sutingco, Alexander-Nicholas D, MD  02/05/20 [phone]

## 2020-02-05 NOTE — ED Notes (Signed)
Ultrasound IV placed but removed due to concerns for infiltration. Area became mildly swollen and red. IV placed in left arm to resume IV fluids.

## 2020-02-05 NOTE — ED Notes (Signed)
Report given to Andrew, RN.

## 2020-02-10 ENCOUNTER — Telehealth (INDEPENDENT_AMBULATORY_CARE_PROVIDER_SITE_OTHER): Payer: No Typology Code available for payment source | Admitting: Nurse Practitioner

## 2020-02-10 DIAGNOSIS — N3000 Acute cystitis without hematuria: Secondary | ICD-10-CM

## 2020-02-10 MED ORDER — NITROFURANTOIN MONOHYD MACRO 100 MG PO CAPS
100.00 mg | ORAL_CAPSULE | Freq: Two times a day (BID) | ORAL | 0 refills | Status: AC
Start: 2020-02-10 — End: 2020-02-15

## 2020-02-10 NOTE — Patient Instructions (Signed)
Bladder Infection, Female (Adult)     Urine normally doesn't have any germs (bacteria) in it. But bacteria can get into the urinary tract from the skin around the rectum. Or they can travel in the blood from other parts of the body. Once they are in your urinary tract, they can cause infection in these areas:  · The urethra (urethritis)  · The bladder (cystitis)  · The kidneys (pyelonephritis)  The most common place for an infection is in the bladder. This is called a bladder infection. This is one of the most common infections in women. Most bladder infections are easily treated. They are not serious unless the infection spreads to the kidney.  The terms bladder infection, UTI, and cystitis are often used to describe the same thing. But they are not always the same. Cystitis is an inflammation of the bladder. The most common cause of cystitis is an infection.  Symptoms  The infection causes inflammation in the urethra and bladder. This causes many of the symptoms. The most common symptoms of a bladder infection are:  · Pain or burning when urinating  · Having to urinate more often than normal  · Urgent need to urinate  · Only a small amount of urine comes out  · Blood in urine  · Belly (abdominal) discomfort. This is often in the lower belly above the pubic bone.  · Cloudy urine  · Strong- or bad-smelling urine  · Unable to urinate (urinary retention)  · Unable to hold urine in (urinary incontinence)  · Fever  · Loss of appetite  · Confusion (in older adults)  Causes  Bladder infections are not contagious. You can't get one from someone else, from a toilet seat, or from sharing a bath.  The most common cause of bladder infections is bacteria from the bowels. The bacteria get onto the skin around the opening of the urethra. From there, they can get into the urine. Then they travel up to the bladder, causing inflammation and infection. This often happens because of:  · Wiping incorrectly after urinating. Always  wipe from front to back.  · Bowel incontinence  · Pregnancy  · Procedures such as having a catheter put in  · Older age  · Not emptying your bladder. This can give bacteria a chance to grow in your urine.  · Fluid loss (dehydration)  · Constipation  · Having sex  · Using a diaphragm for birth control   Treatment  Bladder infections are diagnosed by a urine test and urine culture. They are treated with antibiotics. They often clear up quickly without problems. Treatment helps prevent a more serious kidney infection.  Medicines  Medicines can help in the treatment of a bladder infection:  · Take antibiotics until they are used up, even if you feel better. It's important to finish them to make sure the infection has cleared.  · You can use acetaminophen or ibuprofen for pain, fever, or discomfort, unless another medicine was prescribed. If you have long-term (chronic) liver or kidney disease, talk with your healthcare provider before using these medicines. Also talk with your provider if you've ever had a stomach ulcer or GI (gastrointestinal) bleeding, or are taking blood-thinner medicines.  · If you are given phenazopydridine to reduce burning with urination, it will make your urine a bright orange color. This can stain clothing.  Care and prevention  These self-care steps can help prevent future infections:  · Drink plenty of fluids. This helps to prevent dehydration and flush out your bladder. Do this unless   you must restrict fluids for other health reasons, or your healthcare provider told you not to.  · Clean yourself correctly after going to the bathroom. Wipe from front to back after using the toilet. This helps prevent the spread of bacteria.  · Urinate more often. Don't try to hold urine in for a long time.  · Wear loose-fitting clothes and cotton underwear. Don't wear tight-fitting pants.  · Improve your diet and prevent constipation. Eat more fresh fruits and vegetables, and fiber. Eat less junk foods and  fatty foods.  · Don't have sex until your symptoms are gone.  · Don't have caffeine, alcohol, and spicy foods. These can irritate your bladder.  · Urinate right after you have sex to flush out your bladder.  · If you use birth control pills and have frequent bladder infections, discuss it with your healthcare provider.  Follow-up care  Call your healthcare provider if all symptoms are not gone after 3 days of treatment. This is especially important if you have repeat infections.  If a culture was done, you will be told if your treatment needs to be changed. If directed, you can call to find out the results.  If X-rays were done, you will be told if the results will affect your treatment.  Call 911  Call 911 if any of the following occur:  · Trouble breathing  · Hard to wake up or confusion  · Fainting (loss of consciousness)  · Fast heart rate  When to get medical advice  Call your healthcare provider right away if any of these occur:  · Fever of 100.4ºF (38.0ºC) or higher, or as directed by your healthcare provider  · Symptoms are not better after 3 days of treatment  · Back or belly pain that gets worse  · Repeated vomiting, or unable to keep medicine down  · Weakness or dizziness  · Vaginal discharge  · Pain, redness, or swelling in the outer vaginal area (labia)  [employer] last reviewed this educational content on 06/16/2018    © 2000-2021 [employer]. All rights reserved. This information is not intended as a substitute for professional medical care. Always follow your healthcare professional's instructions.

## 2020-02-10 NOTE — Progress Notes (Deleted)
[facility]  PROGRESS NOTE     Patient: Bridget [name]   Date: 02/10/2020   MRN: [id number]       Bridget [name] is a 24 y.o. female h/o POTTS recent DX with gastroparesis   Hospitalized for intractable vomit   UTI SX burning frequency urgency , No back pain fever no hematuria no recent UTI    LMP June 19th  on [facility]  02/05/20 negative HCG qual   Taking reglan for 2 weeks and having less nausea started lactating today side effect is   HISTORY     History obtained from: Patient    No chief complaint on file.       HPI     Review of Systems   Constitutional: Negative for chills, diaphoresis and fever.   HENT: Negative for congestion, rhinorrhea, sneezing and sore throat.         No recent illness     Respiratory: Negative for cough and shortness of breath.    Cardiovascular: Negative for chest pain.   Gastrointestinal: Positive for abdominal pain (mild suprapubic pain). Negative for abdominal distention, constipation, diarrhea, nausea and vomiting.   Genitourinary: Positive for dysuria, frequency and urgency. Negative for flank pain, hematuria, menstrual problem, pelvic pain, vaginal bleeding and vaginal discharge.   Musculoskeletal: Negative for arthralgias, back pain (no CVA tenderness) and myalgias.   Skin: Negative for pallor and rash.   Neurological: Negative for dizziness and headaches.   Hematological: Negative for adenopathy.   Psychiatric/Behavioral: Negative for confusion.   All other systems reviewed and are negative.      History:    Pertinent Past Medical, Surgical, Family and Social History were reviewed.        Current Outpatient Medications:   .  famotidine (PEPCID) 20 MG tablet, Take 1 tablet (20 mg total) by mouth every evening for 5 days, Disp: 10 tablet, Rfl: 0  .  metoclopramide (REGLAN) 10 MG tablet, Take 1 tablet (10 mg total) by mouth 3 (three) times daily before meals for 14 days, Disp: 42 tablet, Rfl: 0  .  norethindrone-ethinyl estradiol (ORTHO-NOVUM 1/35) 1-35 MG-MCG per tablet,  Take 1 tablet by mouth daily, Disp: 84 tablet, Rfl: 1  .  nitrofurantoin, macrocrystal-monohydrate, (MACROBID) 100 MG capsule, Take 1 capsule (100 mg total) by mouth 2 (two) times daily for 5 days, Disp: 10 capsule, Rfl: 0    Allergies   Allergen Reactions   . Latex Itching     Itching irritation swelling condoms  Itching irritation swelling condoms  Itching irritation swelling condoms       Medications and Allergies reviewed.    PHYSICAL EXAM     There were no vitals filed for this visit.    Physical Exam  Physical Exam  Constitutional:       General: Not in acute distress.     Appearance: Normal appearance. Not ill-appearing or toxic-appearing.   HENT:      Head: Normocephalic and atraumatic.   Eyes:      Conjunctiva/sclera: Conjunctivae normal.   Neck:      Musculoskeletal: Normal range of motion.   Respiratory:      Normal effort. Able to speak in full sentences.  Neurological:      Mental Status: Alert and oriented.  Psychiatric:         Mood and Affect: Mood normal.         Behavior: Behavior normal.         UCC  COURSE     LABS  The following POCT tests were ordered, reviewed and discussed with the patient/family.     Results     ** No results found for the last 24 hours. **              No results found.    No current facility-administered medications for this visit.       PROCEDURES     Procedures    MEDICAL DECISION MAKING     History, physical, labs/studies most consistent with *** as the diagnosis.    Chart Review:  Prior PCP, Specialist and/or ED notes reviewed today: {Yes/No/NA:41802}  Prior labs/images/studies reviewed today: {Yes/No/[id number]}    Differential Diagnosis: ***      ASSESSMENT     Encounter Diagnosis   Name Primary?   . Acute cystitis without hematuria Yes            PLAN      PLAN: ***      No orders of the defined types were placed in this encounter.    Requested Prescriptions     Signed Prescriptions Disp Refills   . nitrofurantoin, macrocrystal-monohydrate, (MACROBID) 100 MG capsule 10  capsule 0     Sig: Take 1 capsule (100 mg total) by mouth 2 (two) times daily for 5 days       Discussed results and diagnosis with patient/family.  Reviewed warning signs for worsening condition, as well as, indications for follow-up with primary care physician and return to urgent care clinic.   Patient/family expressed understanding of instructions.     An After Visit Summary was provided to the patient.

## 2020-02-11 NOTE — Progress Notes (Signed)
[facility]  PROGRESS NOTE     Patient: Bridget [name]   Date: 02/11/2020   MRN: [id number]     This visit is being conducted via video and telephone.   Verbal consent has been obtained from the patient to conduct a video visit to minimize exposure to COVID-19: yes    Bridget [name] is a 24 y.o. female    h/o POTTS recent DX with gastroparesis   Hospitalized for intractable vomit   Now w/ c/o UTI SX burning frequency urgency, No back pain fever no hematuria no recent UTI    LMP June 19th  on [facility]  02/05/20 negative HCG qual   Taking reglan for 2 weeks and having less nausea started lactating today side effect .       HISTORY     No chief complaint on file.       HPI    Review of Systems   Constitutional: Negative for chills, diaphoresis and fever.   HENT: Negative for congestion, rhinorrhea, sneezing and sore throat.         No recent illness     Respiratory: Negative for cough and shortness of breath.    Cardiovascular: Negative for chest pain.   Gastrointestinal: Positive for abdominal pain (mild suprapubic pain). Negative for abdominal distention, constipation, diarrhea, nausea and vomiting.   Genitourinary: Positive for dysuria, frequency and urgency. Negative for flank pain, hematuria, menstrual problem, pelvic pain, vaginal bleeding and vaginal discharge.   Musculoskeletal: Negative for arthralgias, back pain (no CVA tenderness) and myalgias.   Skin: Negative for pallor and rash.   Neurological: Negative for dizziness and headaches.   Hematological: Negative for adenopathy.   Psychiatric/Behavioral: Negative for confusion.   All other systems reviewed and are negative.      History:  Past Medical History:   Diagnosis Date   . Abdominal pain    . Asthma     mild seasonal controlled   . Complication of anesthesia    . Post-operative nausea and vomiting    . POTS (postural orthostatic tachycardia syndrome)     [facility]/stable for many yrs   . Sinus trouble      seasonal   . Yeast infection involving the vagina and surrounding area 05/09/2015       Past Surgical History:   Procedure Laterality Date   . ADENOIDECTOMY     . EGD N/A 01/23/2018    Procedure: EGD;  Surgeon: Jacqulyn Cane, MD;  Location: [facility];  Service: Gastroenterology;  Laterality: N/A;  EGD  Q1=N/A   . TONSILLECTOMY     . WISDOM TOOTH EXTRACTION         Family History   Problem Relation Age of Onset   . Cancer Mother    . Ehlers-Danlos syndrome Sister    . Asthma Brother        Social History     Socioeconomic History   . Marital status: Single     Spouse name: Not on file   . Number of children: Not on file   . Years of education: Not on file   . Highest education level: Not on file   Occupational History   . Not on file   Tobacco Use   . Smoking status: Never Smoker   . Smokeless tobacco: Never Used   . Tobacco comment: the patient vapes nicotine    Vaping Use   . Vaping Use: Every day   .  Start date: 04/14/2018   Substance and Sexual Activity   . Alcohol use: Not Currently   . Drug use: Yes     Comment: Pt. uses marijuana every day. Pt. reports not using it for the past 3 days.   . Sexual activity: Never     Comment: takes the Pill continuously   Other Topics Concern   . Poor school performance No   . International travel in past 12 mos No   Social History Narrative   . Not on file     Social Determinants of Health     Financial Resource Strain: Low Risk    . Difficulty of Paying Living Expenses: Not hard at all   Food Insecurity: No Food Insecurity   . Worried About Programme researcher, broadcasting/film/video in the Last Year: Never true   . Ran Out of Food in the Last Year: Never true   Transportation Needs: No Transportation Needs   . Lack of Transportation (Medical): No   . Lack of Transportation (Non-Medical): No   Physical Activity:    . Days of Exercise per Week:    . Minutes of Exercise per Session:    Stress:    . Feeling of Stress :    Social Connections:    . Frequency of Communication with Friends and  Family:    . Frequency of Social Gatherings with Friends and Family:    . Attends Religious Services:    . Active Member of Clubs or Organizations:    . Attends Banker Meetings:    Marland Kitchen Marital Status:    Intimate Partner Violence:    . Fear of Current or Ex-Partner:    . Emotionally Abused:    Marland Kitchen Physically Abused:    . Sexually Abused:        History reviewed.        Current Outpatient Medications:   .  metoclopramide (REGLAN) 10 MG tablet, Take 1 tablet (10 mg total) by mouth 3 (three) times daily before meals for 14 days, Disp: 42 tablet, Rfl: 0  .  norethindrone-ethinyl estradiol (ORTHO-NOVUM 1/35) 1-35 MG-MCG per tablet, Take 1 tablet by mouth daily, Disp: 84 tablet, Rfl: 1  .  nitrofurantoin, macrocrystal-monohydrate, (MACROBID) 100 MG capsule, Take 1 capsule (100 mg total) by mouth 2 (two) times daily for 5 days, Disp: 10 capsule, Rfl: 0    Allergies   Allergen Reactions   . Latex Itching     Itching irritation swelling condoms  Itching irritation swelling condoms  Itching irritation swelling condoms       Wynetta Fines, MD      PHYSICAL EXAM     There were no vitals filed for this visit.    Physical Exam    Physical Exam  Constitutional:       General: Not in acute distress.     Appearance: Normal appearance. Not ill-appearing or toxic-appearing.   HENT:      Head: Normocephalic and atraumatic.   Eyes:      Conjunctiva/sclera: Conjunctivae normal.   Neck:      Musculoskeletal: Normal range of motion.   Respiratory:      Normal effort. Able to speak in full sentences.  Neurological:      Mental Status: Alert and oriented.  Psychiatric:         Mood and Affect: Mood normal.         Behavior: Behavior normal.   MEDICAL DECISION MAKING  DDX:  UTI interstitial cystitis STI    LABS     Results     ** No results found for the last 24 hours. **          ASSESSMENT     Encounter Diagnosis   Name Primary?   . Acute cystitis without hematuria Yes          ASSESSMENT    PLAN     PLAN: f/u with GI  regarding reglan side effects  If symptomatic  UTI SX after UTI TX f/u with PCP     Discussed diagnosis and treatment with patient.  Reviewed warning signs for worsening condition as well as indications for follow-up with pmd, return to the urgent care center or emergency department.  The patient expressed understanding of instructions.    Patient was offered the alternative of an in person visits and they declined.  Yes    No orders of the defined types were placed in this encounter.        An After Visit Summary was emailed to the to the patient or parent.  Yes    Time spent in discussion: 20  Minutes                                                                          Signed,  Hinton Lovely, NP

## 2020-03-08 ENCOUNTER — Telehealth (INDEPENDENT_AMBULATORY_CARE_PROVIDER_SITE_OTHER): Payer: Self-pay | Admitting: Family Medicine

## 2020-03-11 ENCOUNTER — Telehealth (INDEPENDENT_AMBULATORY_CARE_PROVIDER_SITE_OTHER): Payer: Self-pay | Admitting: Family Medicine

## 2020-03-13 ENCOUNTER — Ambulatory Visit (INDEPENDENT_AMBULATORY_CARE_PROVIDER_SITE_OTHER): Payer: No Typology Code available for payment source | Admitting: Family Medicine

## 2020-03-13 ENCOUNTER — Encounter (INDEPENDENT_AMBULATORY_CARE_PROVIDER_SITE_OTHER): Payer: Self-pay | Admitting: Family Medicine

## 2020-03-13 VITALS — BP 104/72 | HR 86 | Temp 97.6°F | Resp 15 | Ht 64.0 in | Wt 107.0 lb

## 2020-03-13 DIAGNOSIS — Z124 Encounter for screening for malignant neoplasm of cervix: Secondary | ICD-10-CM

## 2020-03-13 DIAGNOSIS — Z3041 Encounter for surveillance of contraceptive pills: Secondary | ICD-10-CM

## 2020-03-13 DIAGNOSIS — N390 Urinary tract infection, site not specified: Secondary | ICD-10-CM

## 2020-03-13 DIAGNOSIS — Z23 Encounter for immunization: Secondary | ICD-10-CM

## 2020-03-13 DIAGNOSIS — Z0000 Encounter for general adult medical examination without abnormal findings: Secondary | ICD-10-CM

## 2020-03-13 MED ORDER — NITROFURANTOIN MONOHYD MACRO 100 MG PO CAPS
ORAL_CAPSULE | ORAL | 0 refills | Status: DC
Start: 2020-03-13 — End: 2020-08-01

## 2020-03-13 MED ORDER — NORETHINDRONE-ETH ESTRADIOL 1-35 MG-MCG PO TABS
1.0000 | ORAL_TABLET | Freq: Every day | ORAL | 3 refills | Status: DC
Start: 2020-03-13 — End: 2021-02-16

## 2020-03-13 NOTE — Progress Notes (Signed)
[facility]                       Date of Exam: 03/13/2020 4:13 PM        Patient ID: Bridget [name] is a 24 y.o. female.  Attending Physician: Reynold Bowen, MD        Chief Complaint:    Chief Complaint   Patient presents with   . Annual Exam               HPI:    HPI   Pt states the last time she had a tdap was in middle school she thinks.    Visit Type: Health Maintenance Visit    Reported Health: good health  Reported Diet: compliant with well-balanced diet  Reported Exercise: occasionally, 3-4x/week, <15 minutes and walking    Dental: regular dental visits twice a year  Vision: glasses, contact lenses and regular eye exams   Hearing: normal hearing    Immunization Status: pt believes UTD; will send vaccine records.     Menses - Patient's last menstrual period was 03/03/2020 (approximate).            Reproductive Health: sexually active  Contraception: use no condoms. Denies STD concerns     PHQ 2: 0    Prior Screening Tests: no previous colorectal cancer screening and no previous pap smear  Safety Elements Used: uses seat belts, smoke detectors in household, carbon monoxide detectors in household, sunscreen use, does not text and drive and no guns at home    Endorses recurrent UTIs, typically occurs after having intercourse.           Problem List:    Patient Active Problem List   Diagnosis   . Postcoital UTI   . Asthma   . Delayed gastric emptying   . Ehlers-Danlos syndrome   . Endometriosis   . Postural orthostatic tachycardia syndrome   . Intractable vomiting with nausea   . Nausea and vomiting, intractability of vomiting not specified, unspecified vomiting type             Current Meds:    Outpatient Medications Marked as Taking for the 03/13/20 encounter (Office Visit) with Reynold Bowen, MD   Medication Sig Dispense Refill   . norethindrone-ethinyl estradiol (ORTHO-NOVUM 1/35) 1-35 MG-MCG per tablet Take 1 tablet by mouth daily 84 tablet 3   . ondansetron  (ZOFRAN) 24 MG tablet Take 24 mg by mouth daily as needed for Nausea            Allergies:    Allergies   Allergen Reactions   . Latex Itching     Itching irritation swelling condoms  Itching irritation swelling condoms  Itching irritation swelling condoms             Past Surgical History:    Past Surgical History:   Procedure Laterality Date   . ADENOIDECTOMY     . EGD N/A 01/23/2018    Procedure: EGD;  Surgeon: Jacqulyn Cane, MD;  Location: [facility];  Service: Gastroenterology;  Laterality: N/A;  EGD  Q1=N/A   . TONSILLECTOMY     . WISDOM TOOTH EXTRACTION             Family History:    Family History   Problem Relation Age of Onset   . Cancer Mother    . Ehlers-Danlos syndrome Sister    . Asthma Brother  Social History:    Social History     Tobacco Use   . Smoking status: Never Smoker   . Smokeless tobacco: Never Used   . Tobacco comment: the patient vapes nicotine    Vaping Use   . Vaping Use: Every day   . Start date: 04/14/2018   Substance Use Topics   . Alcohol use: Not Currently   . Drug use: Yes     Comment: Pt. uses marijuana every day. Pt. reports not using it for the past 3 days.          The following sections were reviewed this encounter by the provider:   Tobacco  Allergies  Meds  Problems  Med Hx  Surg Hx  Fam Hx             Vital Signs:    BP 104/72 (BP Site: Right arm, Patient Position: Sitting, Cuff Size: Medium)   Pulse 86   Temp 97.6 F (36.4 C) (Tympanic)   Resp 15   Ht 1.626 m (5\' 4" )   Wt 48.5 kg (107 lb)   LMP 03/03/2020 (Approximate)   BMI 18.37 kg/m          ROS:    Review of Systems   Constitutional: Negative for activity change, appetite change, chills, diaphoresis, fatigue, fever and unexpected weight change.   HENT: Negative for congestion, ear pain and hearing loss.    Eyes: Negative for visual disturbance.   Respiratory: Negative for cough, chest tightness, shortness of breath and wheezing.    Cardiovascular: Negative for chest pain,  palpitations and leg swelling.   Gastrointestinal: Negative for abdominal pain, blood in stool, constipation, diarrhea, nausea and vomiting.   Endocrine: Negative for polydipsia and polyuria.   Genitourinary: Negative for dysuria, frequency, hematuria and urgency.   Musculoskeletal: Negative for arthralgias and myalgias.   Skin: Negative for rash.   Neurological: Negative for dizziness, weakness, light-headedness and headaches.   Psychiatric/Behavioral: Negative for behavioral problems and confusion.              Physical Exam:    Physical Exam  Vitals reviewed.   Constitutional:       General: She is not in acute distress.     Appearance: Normal appearance. She is normal weight.   HENT:      Head: Normocephalic and atraumatic.      Right Ear: Tympanic membrane normal.      Left Ear: Tympanic membrane normal.      Nose: Nose normal.      Mouth/Throat:      Mouth: Mucous membranes are moist.      Pharynx: Oropharynx is clear.   Eyes:      Extraocular Movements: Extraocular movements intact.      Conjunctiva/sclera: Conjunctivae normal.      Pupils: Pupils are equal, round, and reactive to light.   Cardiovascular:      Rate and Rhythm: Normal rate and regular rhythm.      Pulses: Normal pulses.   Pulmonary:      Effort: Pulmonary effort is normal. No respiratory distress.      Breath sounds: Normal breath sounds. No wheezing, rhonchi or rales.   Abdominal:      General: There is no distension.      Palpations: Abdomen is soft.      Tenderness: There is no abdominal tenderness. There is no guarding or rebound.   Genitourinary:     Pubic Area: No rash.  Vagina: No vaginal discharge.      Cervix: Erythema present.      Comments: Sample taken for pap smear   Musculoskeletal:         General: Normal range of motion.      Cervical back: Normal range of motion and neck supple.   Skin:     General: Skin is warm and dry.   Neurological:      General: No focal deficit present.      Mental Status: She is alert and oriented  to person, place, and time. Mental status is at baseline.   Psychiatric:         Mood and Affect: Mood normal.         Behavior: Behavior normal.         Thought Content: Thought content normal.         Judgment: Judgment normal.              Assessment:    1. Encounter for annual health examination    2. Screening for malignant neoplasm of cervix  - Pap IG, rfx Aptima HPV all Path TP    3. Encounter for surveillance of contraceptive pills  - norethindrone-ethinyl estradiol (ORTHO-NOVUM 1/35) 1-35 MG-MCG per tablet; Take 1 tablet by mouth daily  Dispense: 84 tablet; Refill: 3    4. Postcoital UTI  - nitrofurantoin, macrocrystal-monohydrate, (MACROBID) 100 MG capsule; Take 1 tab by mouth as a single dose taken within 2 hours of sexual intercourse  Dispense: 15 capsule; Refill: 0    5. Encounter for immunization  - Tdap vaccine greater than or equal to 7yo IM    Pt to send vaccine records once available.         Plan:    Health Maintenance    Lifestyle discussed: Healthy diet rich in fruits and vegetables, whole grains and lean meats, scarce in sugars, carbs, processed foods.   Exercise 30 minutes at least five days a week. (total of 150 minutes per week).    Wear Sunscreen in the sun, helmets on every bike ride and seat belts for every car ride.           Follow-up:    Return in about 1 year (around 03/13/2021) for Annual exam.         Reynold Bowen, MD

## 2020-03-13 NOTE — Patient Instructions (Signed)
Prevention Guidelines, Women Ages 18 to 39  Screening tests and vaccines are an important part of managing your health. A screening test is done to find possible disorders or diseases in people who don't have any symptoms. The goal is to find a disease early so lifestyle changes can be made and you can be watched more closely to reduce the risk of disease, or to detect it early enough to treat it most effectively. Screening tests are not considered diagnostic, but are used to determine if more testing is needed. Health counseling is essential, too. Below are guidelines for these, for women ages 18 to 39. Talk with your healthcare provider to make sure you’re up-to-date on what you need.   Screening Who needs it How often   Alcohol misuse All women in this age group  At routine exams   Blood pressure All women in this age group  Yearly checkup if your blood pressure is normal   Normal blood pressure is less than 120/80 mm Hg   If your blood pressure reading is higher than normal, follow the advice of your healthcare provider    Breast cancer All women in this age group should talk with their healthcare providers about the need for clinical breast exams (CBE)1  Clinical breast exam every 3 years1    Cervical cancer Women ages 21 and older  Women between ages 21 and 29 should have a Pap test every 3 years; women between ages 30 and 65 are advised to have a Pap test plus an HPV test every 5 years    Chlamydia Sexually active women ages 24 and younger, and women at increased risk for infection  Every 3 years if you're at risk or have symptoms    Depression All women in this age group  At routine exams   Diabetes mellitus, type 2  Adults with no symptoms who are overweight or obese and have 1 or more other risk factors for diabetes  At least every 3 years. Also, testing for diabetes during pregnancy after the 24th week.     Gonorrhea Sexually active women at increased risk for infection  At routine exams   Hepatitis C  Anyone at increased risk  At routine exams   HIV All women At routine exams3     Obesity All women in this age group  At routine exams   Syphilis Women at increased risk for infection should talk with their healthcare provider  At routine exams   Tuberculosis Women at increased risk for infection should talk with their healthcare provider  Ask your healthcare provider    Vision All women in this age group  At least 1 complete exam in your 20s, and 2 in your 30s    Vaccine Who needs it How often   Chickenpox (varicella)  All women in this age group who have no record of this infection or vaccine  2 doses; the second dose should be given 4 to 8 weeks after the first dose    Hepatitis A Women at increased risk for infection should talk with their healthcare provider  2 doses given at least 6 months apart    Hepatitis B Women at increased risk for infection should talk with their healthcare provider  3 doses over 6 months; second dose should be given 1 month after the first dose; the third dose should be given at least 2 months after the second dose and at least 4 months after the first dose    Haemophilus   influenzae  Type B (HIB)  Women at increased risk for infection should talk with their healthcare provider  1 to 3 doses   Human papillomavirus (HPV)  All women in this age group up to age 26  3 doses; the second dose should be given 1 to 2 months after the first dose and the third dose given 6 months after the first dose    Influenza (flu) All women in this age group  Once a year   Measles, mumps, rubella (MMR)  All women in this age group who have no record of these infections or vaccines  1 or 2 doses   Meningococcal Women at increased risk for infection should talk with their healthcare provider  1 or more doses   Pneumococcal conjugate vaccine (PCV13) and pneumococcal polysaccharide vaccine (PPSV23)  Women at increased risk for infection should talk with their healthcare provider  PCV13: 1 dose ages 19 to 65  (protects against 13 types of pneumococcal bacteria)   PPSV23: 1 to 2 doses through age 64, or 1 dose at 65 or older (protects against 23 types of pneumococcal bacteria)    Tetanus/diphtheria/pertussis (Td/Tdap) booster All women in this age group  Td every 10 years, or a one-time dose of Tdap instead of a Td booster after age 18, then Td every 10 years    Counseling Who needs it How often   BRCA gene mutation testing for breast and ovarian cancer susceptibility  Women with increased risk for having gene mutation  When your risk is known    Breast cancer and chemoprevention  Women at high risk for breast cancer  When your risk is known    Diet and exercise Women who are overweight or obese  When diagnosed, and then at routine exams    Domestic violence Women at the age in which they are able to have children  At routine exams   Sexually transmitted infection prevention  Women who are sexually active  At routine exams   Skin cancer Prevention of skin cancer in fair-skinned adults  At routine exams   Use of tobacco and the health effects it can cause  All women in this age group  Every visit   1 According to the ACS, women ages 20 to 39 years should have a clinical breast exam (CBE) as part of their routine health exam every 3 years. Breast self-exams are an option for women starting in their 20s. But the U.S. Preventive Services Task Force (USPSTF) does not recommend CBE.   2 Those who are 22 years old and not up-to-date on their childhood vaccines should get all appropriate catch-up vaccines recommended by the CDC.   3 The USPSTF recommends that all people ages 15 to 65 years be screened for HIV and those younger or older people at increased risk. The CDC recommends that everyone between the ages of 13 and 64 get tested for HIV at least once as part of routine health care.   [employer] last reviewed this educational content on 05/16/2016  © 2000-2021 [employer]. All rights reserved. This information is  not intended as a substitute for professional medical care. Always follow your healthcare professional's instructions.

## 2020-03-19 ENCOUNTER — Encounter (INDEPENDENT_AMBULATORY_CARE_PROVIDER_SITE_OTHER): Payer: Self-pay | Admitting: Family Medicine

## 2020-03-19 ENCOUNTER — Telehealth (INDEPENDENT_AMBULATORY_CARE_PROVIDER_SITE_OTHER): Payer: Self-pay | Admitting: Family Medicine

## 2020-03-19 ENCOUNTER — Encounter (INDEPENDENT_AMBULATORY_CARE_PROVIDER_SITE_OTHER): Payer: Self-pay

## 2020-03-19 LAB — PAP IG, RFX APTIMA HPV ALL PTH TP: .: 0

## 2020-03-19 LAB — HPV APTIMA TP: HPV APTIMA: POSITIVE — AB

## 2020-03-19 NOTE — Telephone Encounter (Signed)
-----   Message from Reynold Bowen, MD sent at 03/19/2020 10:40 AM EDT -----  Please call pt with results. Thanks      Pap smear returned abnormal. Showed some atypical cells, which are not necessarily cancerous. However, the HPV test also resulted positive. Would recommend following up with GYN for further evaluation.

## 2020-03-19 NOTE — Telephone Encounter (Signed)
LMTCB at cell no. RE: Results annotations.

## 2020-03-19 NOTE — Progress Notes (Signed)
Do you rec OTC monistat for yeast inf after antibiotics?

## 2020-03-20 NOTE — Telephone Encounter (Signed)
LDVM informing pt of provider's annotations below, ok per disclosure form. Instructed to call back with any further questions.

## 2020-03-31 ENCOUNTER — Emergency Department
Admission: EM | Admit: 2020-03-31 | Discharge: 2020-03-31 | Disposition: A | Discharge status: Home / Self Care | Payer: No Typology Code available for payment source | Attending: Emergency Medicine | Admitting: Emergency Medicine

## 2020-03-31 DIAGNOSIS — R1115 Cyclical vomiting syndrome unrelated to migraine: Secondary | ICD-10-CM | POA: Insufficient documentation

## 2020-03-31 LAB — CBC AND DIFFERENTIAL
Absolute NRBC: 0 10*3/uL (ref 0.00–0.00)
Absolute NRBC: 0 10*3/uL (ref 0.00–0.00)
Basophils Absolute Automated: 0.04 10*3/uL (ref 0.00–0.08)
Basophils Absolute Automated: 0.05 10*3/uL (ref 0.00–0.08)
Basophils Automated: 0.4 %
Basophils Automated: 0.4 %
Eosinophils Absolute Automated: 0 10*3/uL (ref 0.00–0.44)
Eosinophils Absolute Automated: 0 10*3/uL (ref 0.00–0.44)
Eosinophils Automated: 0 %
Eosinophils Automated: 0 %
Hematocrit: 42.7 % (ref 34.7–43.7)
Hematocrit: 42.9 % (ref 34.7–43.7)
Hgb: 14 g/dL (ref 11.4–14.8)
Hgb: 14.6 g/dL (ref 11.4–14.8)
Immature Granulocytes Absolute: 0.03 10*3/uL (ref 0.00–0.07)
Immature Granulocytes Absolute: 0.07 10*3/uL (ref 0.00–0.07)
Immature Granulocytes: 0.3 %
Immature Granulocytes: 0.6 %
Lymphocytes Absolute Automated: 0.6 10*3/uL (ref 0.42–3.22)
Lymphocytes Absolute Automated: 0.62 10*3/uL (ref 0.42–3.22)
Lymphocytes Automated: 5.1 %
Lymphocytes Automated: 5.5 %
MCH: 30.2 pg (ref 25.1–33.5)
MCH: 31.5 pg (ref 25.1–33.5)
MCHC: 32.8 g/dL (ref 31.5–35.8)
MCHC: 34 g/dL (ref 31.5–35.8)
MCV: 92.2 fL (ref 78.0–96.0)
MCV: 92.7 fL (ref 78.0–96.0)
MPV: 10.2 fL (ref 8.9–12.5)
MPV: 9.7 fL (ref 8.9–12.5)
Monocytes Absolute Automated: 0.37 10*3/uL (ref 0.21–0.85)
Monocytes Absolute Automated: 0.39 10*3/uL (ref 0.21–0.85)
Monocytes: 3.2 %
Monocytes: 3.4 %
Neutrophils Absolute: 10.26 10*3/uL — ABNORMAL HIGH (ref 1.10–6.33)
Neutrophils Absolute: 10.62 10*3/uL — ABNORMAL HIGH (ref 1.10–6.33)
Neutrophils: 90.4 %
Neutrophils: 90.7 %
Nucleated RBC: 0 /100 WBC (ref 0.0–0.0)
Nucleated RBC: 0 /100 WBC (ref 0.0–0.0)
Platelets: 265 10*3/uL (ref 142–346)
Platelets: 275 10*3/uL (ref 142–346)
RBC: 4.63 10*6/uL (ref 3.90–5.10)
RBC: 4.63 10*6/uL (ref 3.90–5.10)
RDW: 13 % (ref 11–15)
RDW: 13 % (ref 11–15)
WBC: 11.34 10*3/uL — ABNORMAL HIGH (ref 3.10–9.50)
WBC: 11.71 10*3/uL — ABNORMAL HIGH (ref 3.10–9.50)

## 2020-03-31 LAB — URINALYSIS REFLEX TO MICROSCOPIC EXAM - REFLEX TO CULTURE
Bilirubin, UA: NEGATIVE
Glucose, UA: 50 — AB
Ketones UA: >=80 — AB
Nitrite, UA: NEGATIVE
Protein, UR: 100 — AB
Specific Gravity UA: 1.03 (ref 1.001–1.035)
Urine pH: 5 (ref 5.0–8.0)
Urobilinogen, UA: NORMAL mg/dL (ref 0.2–2.0)

## 2020-03-31 LAB — COMPREHENSIVE METABOLIC PANEL
ALT: 11 U/L (ref 0–55)
AST (SGOT): 15 U/L (ref 5–34)
Albumin/Globulin Ratio: 1.6 (ref 0.9–2.2)
Albumin: 4.6 g/dL (ref 3.5–5.0)
Alkaline Phosphatase: 64 U/L (ref 37–106)
Anion Gap: 15 (ref 5.0–15.0)
BUN: 10 mg/dL (ref 7.0–19.0)
Bilirubin, Total: 0.4 mg/dL (ref 0.2–1.2)
CO2: 19 mEq/L — ABNORMAL LOW (ref 22–29)
Calcium: 9.5 mg/dL (ref 8.5–10.5)
Chloride: 103 mEq/L (ref 100–111)
Creatinine: 0.8 mg/dL (ref 0.6–1.0)
Globulin: 2.9 g/dL (ref 2.0–3.6)
Glucose: 130 mg/dL — ABNORMAL HIGH (ref 70–100)
Potassium: 4 mEq/L (ref 3.5–5.1)
Protein, Total: 7.5 g/dL (ref 6.0–8.3)
Sodium: 137 mEq/L (ref 136–145)

## 2020-03-31 LAB — LIPASE: Lipase: 11 U/L (ref 8–78)

## 2020-03-31 LAB — HEPATIC FUNCTION PANEL
Bilirubin Direct: 0.2 mg/dL (ref 0.0–0.5)
Bilirubin Indirect: 0.2 mg/dL (ref 0.2–1.0)

## 2020-03-31 LAB — GFR: EGFR: >60

## 2020-03-31 MED ORDER — ONDANSETRON 4 MG PO TBDP
4.0000 mg | ORAL_TABLET | Freq: Four times a day (QID) | ORAL | 0 refills | Status: DC | PRN
Start: 2020-03-31 — End: 2020-04-03

## 2020-03-31 MED ORDER — PANTOPRAZOLE SODIUM 40 MG IV SOLR
40.0000 mg | Freq: Once | INTRAVENOUS | Status: AC
Start: 2020-03-31 — End: 2020-03-31
  Administered 2020-03-31: 15:00:00 40 mg via INTRAVENOUS
  Filled 2020-03-31: qty 40

## 2020-03-31 MED ORDER — KETOROLAC TROMETHAMINE 30 MG/ML IJ SOLN
15.0000 mg | Freq: Once | INTRAMUSCULAR | Status: AC
Start: 2020-03-31 — End: 2020-03-31
  Administered 2020-03-31: 12:00:00 15 mg via INTRAVENOUS
  Filled 2020-03-31: qty 1

## 2020-03-31 MED ORDER — SODIUM CHLORIDE 0.9 % IV BOLUS
1000.00 mL | Freq: Once | INTRAVENOUS | Status: AC
Start: 2020-03-31 — End: 2020-03-31
  Administered 2020-03-31: 11:00:00 1000 mL via INTRAVENOUS

## 2020-03-31 MED ORDER — SODIUM CHLORIDE 0.9 % IV SOLN
12.5000 mg | Freq: Once | INTRAVENOUS | Status: AC
Start: 2020-03-31 — End: 2020-03-31
  Administered 2020-03-31: 18:00:00 12.5 mg via INTRAVENOUS
  Filled 2020-03-31: qty 0.5

## 2020-03-31 MED ORDER — ONDANSETRON 4 MG PO TBDP
4.0000 mg | ORAL_TABLET | Freq: Once | ORAL | Status: AC
Start: 2020-03-31 — End: 2020-03-31
  Administered 2020-03-31: 15:00:00 4 mg via ORAL
  Filled 2020-03-31: qty 1

## 2020-03-31 MED ORDER — METOCLOPRAMIDE HCL 5 MG/ML IJ SOLN
10.0000 mg | Freq: Once | INTRAMUSCULAR | Status: AC
Start: 2020-03-31 — End: 2020-03-31
  Administered 2020-03-31: 12:00:00 10 mg via INTRAVENOUS
  Filled 2020-03-31: qty 2

## 2020-03-31 NOTE — ED Notes (Signed)
Urine HCG POC negative resulting.

## 2020-03-31 NOTE — ED Notes (Signed)
MD Woods at bedside.

## 2020-03-31 NOTE — ED Notes (Signed)
This RN spoke w/ pharmacy - they reported they are sending the phenergen to ED shortly.

## 2020-03-31 NOTE — ED Provider Notes (Signed)
EMERGENCY DEPARTMENT HISTORY AND PHYSICAL EXAM    Date Time: 04/01/20 11:26 AM  Patient Name: Bridget [name]  Attending Physician: Jeralyn Ruths. Joseph Art, MD      Clinical course in ED:   Final diagnoses:  Final diagnoses:   Cyclic vomiting syndrome       ED Disposition:   ED Disposition     ED Disposition Condition Date/Time Comment    Discharge  Mon Mar 31, 2020  5:36 PM [name] discharge to home/self care.    Condition at disposition: Stable        Medications given in ED:  ED Medication Orders (From admission, onward)    Start Ordered     Status Ordering Provider    03/31/20 1600 03/31/20 1514  promethazine (PHENERGAN) 12.5 mg in sodium chloride 0.9 % 50 mL IVPB  Once     Route: Intravenous  Ordered Dose: 12.5 mg     Last MAR action: Given Ragina Fenter H    03/31/20 1515 03/31/20 1514  pantoprazole (PROTONIX) injection 40 mg  Once     Route: Intravenous  Ordered Dose: 40 mg     Last MAR action: Given Lateef Juncaj H    03/31/20 1444 03/31/20 1443  ondansetron (ZOFRAN-ODT) disintegrating tablet 4 mg  Once     Route: Oral  Ordered Dose: 4 mg     Last MAR action: Given Tyishia Aune H    03/31/20 1123 03/31/20 1122  metoclopramide (REGLAN) injection 10 mg  Once     Route: Intravenous  Ordered Dose: 10 mg     Last MAR action: Given Torie Towle H    03/31/20 1123 03/31/20 1122  ketorolac (TORADOL) injection 15 mg  Once     Route: Intravenous  Ordered Dose: 15 mg     Last MAR action: Given Rilee Knoll H    03/31/20 0953 03/31/20 0952  sodium chloride 0.9 % bolus 1,000 mL  Once     Route: Intravenous  Ordered Dose: 1,000 mL     Last MAR action: Stopped PADGETT, LAUREN MICHELLE        History of Presenting Illness:     RN triage note:  In [facility] to triage, per mother, pt has hx of POTTS and cyclical vomiting. She has been severe ill since last night. Pt is doubled over, vomiting bile during this triage. Reporting generalized abdominal pain. Pt took a phenergan suppository at 0600 with no relief. She was admitted in  June for this, and told to f/u with GI, but has not been able to get an appointment as there is no availability    MD HPI:    Chief Complaint: vomiting  History obtained from: mother  Onset/Duration: this morning  Quality: nb/nb  Severity:mod  Aggravating Factors: none  Alleviating Factors: none  Associated Symptoms: As below  Narrative/Additional Historical Findings: 24 y.o. female with hx pots/cyclic vomiting, p/w vomiting from home. No fever, + nausea. Similar to prior episodes of cyclic vomiting.     Past Medical History:     Past Medical History:   Diagnosis Date   . Abdominal pain    . Asthma     mild seasonal controlled   . Complication of anesthesia    . Post-operative nausea and vomiting    . POTS (postural orthostatic tachycardia syndrome)     [facility]/stable for many yrs   . Sinus trouble     seasonal   . Yeast infection involving the vagina  and surrounding area 05/09/2015       Past Surgical History:     Past Surgical History:   Procedure Laterality Date   . ADENOIDECTOMY     . EGD N/A 01/23/2018    Procedure: EGD;  Surgeon: Jacqulyn Cane, MD;  Location: [facility];  Service: Gastroenterology;  Laterality: N/A;  EGD  Q1=N/A   . TONSILLECTOMY     . WISDOM TOOTH EXTRACTION         Family History:     Family History   Problem Relation Age of Onset   . Cancer Mother    . Ehlers-Danlos syndrome Sister    . Asthma Brother        Social History:     Social History     Socioeconomic History   . Marital status: Single     Spouse name: Not on file   . Number of children: Not on file   . Years of education: Not on file   . Highest education level: Not on file   Occupational History   . Not on file   Tobacco Use   . Smoking status: Never Smoker   . Smokeless tobacco: Never Used   . Tobacco comment: the patient vapes nicotine    Vaping Use   . Vaping Use: Every day   . Start date: 04/14/2018   Substance and Sexual Activity   . Alcohol use: Not Currently   . Drug use: Yes      Comment: Pt. uses marijuana every day. Pt. reports not using it for the past 3 days.   . Sexual activity: Never     Comment: takes the Pill continuously   Other Topics Concern   . Poor school performance No   . International travel in past 12 mos No   Social History Narrative   . Not on file     Social Determinants of Health     Financial Resource Strain: Low Risk    . Difficulty of Paying Living Expenses: Not hard at all   Food Insecurity: No Food Insecurity   . Worried About Programme researcher, broadcasting/film/video in the Last Year: Never true   . Ran Out of Food in the Last Year: Never true   Transportation Needs: No Transportation Needs   . Lack of Transportation (Medical): No   . Lack of Transportation (Non-Medical): No   Physical Activity:    . Days of Exercise per Week:    . Minutes of Exercise per Session:    Stress:    . Feeling of Stress :    Social Connections:    . Frequency of Communication with Friends and Family:    . Frequency of Social Gatherings with Friends and Family:    . Attends Religious Services:    . Active Member of Clubs or Organizations:    . Attends Banker Meetings:    Marland Kitchen Marital Status:    Intimate Partner Violence:    . Fear of Current or Ex-Partner:    . Emotionally Abused:    Marland Kitchen Physically Abused:    . Sexually Abused:        Allergies:     Allergies   Allergen Reactions   . Latex Itching     Itching irritation swelling condoms  Itching irritation swelling condoms  Itching irritation swelling condoms       Medications:     (Not in a hospital admission)      Review of  Systems:     Pertinent Positives and Negatives noted in the HPI.  All Other Systems Reviewed and Negative: Yes    Physical Exam:     Vitals:    03/31/20 1750   BP: 132/76   Pulse: 64   Resp: 16   Temp:    SpO2: 98%       Constitutional: Vital signs reviewed. Pt is fatigued but non-toxic.   Head: Normocephalic, atraumatic  Eyes: No conjunctival injection. No discharge.  ENT: Mucous membranes moist  Neck: Normal range of motion.  Non-tender.  Respiratory/Chest: Clear to auscultation. No respiratory distress.   Cardiovascular: Regular rate and rhythm. No murmur.   Abdomen: Soft and non-tender. No guarding. No masses or hepatosplenomegaly.  Back: Nrml appearance, no flank ttp, no midline ttp or masses.   LowerExtremity: No edema. No cyanosis.  Neurological: No focal motor deficits. Speech normal. Memory normal.  Msk: Nrml ROM, non-ttp  Skin: Warm and dry. No rash.  Lymphatic: No cervical lymphadenopathy.  Psychiatric: Normal affect. Poor concentration.  Labs:     Results     Procedure Component Value Units Date/Time    Urinalysis Reflex to Microscopic Exam- Reflex to Culture [[id number]]  (Abnormal) Collected: 03/31/20 1104     Updated: 03/31/20 1152     Urine Type Urine, Clean Ca     Color, UA Yellow     Clarity, UA Hazy     Specific Gravity UA 1.030     Urine pH 5.0     Leukocyte Esterase, UA Small     Nitrite, UA Negative     Protein, UR 100     Glucose, UA 50     Ketones UA >=80     Urobilinogen, UA Normal mg/dL      Bilirubin, UA Negative     Blood, UA Small     RBC, UA 3 - 5 /hpf      WBC, UA 6 - 10 /hpf      Squamous Epithelial Cells, Urine 0 - 5 /hpf      Urine Mucus Present    Hepatic function panel (LFT) [[id number]] Collected: 03/31/20 1104     Updated: 03/31/20 1149     Bilirubin Direct 0.2 mg/dL      Bilirubin Indirect 0.2 mg/dL     Comprehensive metabolic panel [[id number]]  (Abnormal) Collected: 03/31/20 1104    Specimen: Blood Updated: 03/31/20 1149     Glucose 130 mg/dL      BUN 08.6 mg/dL      Creatinine 0.8 mg/dL      Sodium 578 mEq/L      Potassium 4.0 mEq/L      Chloride 103 mEq/L      CO2 19 mEq/L      Calcium 9.5 mg/dL      Protein, Total 7.5 g/dL      Albumin 4.6 g/dL      AST (SGOT) 15 U/L      ALT 11 U/L      Alkaline Phosphatase 64 U/L      Bilirubin, Total 0.4 mg/dL      Globulin 2.9 g/dL      Albumin/Globulin Ratio 1.6     Anion Gap 15.0    Lipase [[id number]] Collected: 03/31/20 1104    Specimen: Blood Updated:  03/31/20 1149     Lipase 11 U/L     GFR [[id number]] Collected: 03/31/20 1104     Updated: 03/31/20 1149     EGFR >60.0    CBC  and differential [[id number]]  (Abnormal) Collected: 03/31/20 1104    Specimen: Blood Updated: 03/31/20 1141     WBC 11.71 x10 3/uL      Hgb 14.6 g/dL      Hematocrit 40.9 %      Platelets 275 x10 3/uL      RBC 4.63 x10 6/uL      MCV 92.7 fL      MCH 31.5 pg      MCHC 34.0 g/dL      RDW 13 %      MPV 10.2 fL      Neutrophils 90.7 %      Lymphocytes Automated 5.1 %      Monocytes 3.2 %      Eosinophils Automated 0.0 %      Basophils Automated 0.4 %      Immature Granulocytes 0.6 %      Nucleated RBC 0.0 /100 WBC      Neutrophils Absolute 10.62 x10 3/uL      Lymphocytes Absolute Automated 0.60 x10 3/uL      Monocytes Absolute Automated 0.37 x10 3/uL      Eosinophils Absolute Automated 0.00 x10 3/uL      Basophils Absolute Automated 0.05 x10 3/uL      Immature Granulocytes Absolute 0.07 x10 3/uL      Absolute NRBC 0.00 x10 3/uL     CBC and differential [[id number]]  (Abnormal) Collected: 03/31/20 1104    Specimen: Blood Updated: 03/31/20 1131     WBC 11.34 x10 3/uL      Hgb 14.0 g/dL      Hematocrit 95.6 %      Platelets 265 x10 3/uL      RBC 4.63 x10 6/uL      MCV 92.2 fL      MCH 30.2 pg      MCHC 32.8 g/dL      RDW 13 %      MPV 9.7 fL      Neutrophils 90.4 %      Lymphocytes Automated 5.5 %      Monocytes 3.4 %      Eosinophils Automated 0.0 %      Basophils Automated 0.4 %      Immature Granulocytes 0.3 %      Nucleated RBC 0.0 /100 WBC      Neutrophils Absolute 10.26 x10 3/uL      Lymphocytes Absolute Automated 0.62 x10 3/uL      Monocytes Absolute Automated 0.39 x10 3/uL      Eosinophils Absolute Automated 0.00 x10 3/uL      Basophils Absolute Automated 0.04 x10 3/uL      Immature Granulocytes Absolute 0.03 x10 3/uL      Absolute NRBC 0.00 x10 3/uL             Rads:     No orders to display       Medical Decision Making:   I reviewed the vital signs, nursing notes, past medical history,  past surgical history, family history and social history.  Vital Signs -   No data found.    Procedures:  Procedures    Interpretations:    Attestations:       This note and the patient instructions accurately reflect work and decisions made by me.     Signed by: Jeralyn Ruths Joseph Art, MD           Leitha Schuller, MD  04/01/20 [phone]

## 2020-03-31 NOTE — Discharge Instructions (Signed)
Cyclic Vomiting Syndrome    You have been diagnosed with cyclic vomiting syndrome.    Cyclic vomiting syndrome (CVS) is when people suffer from repeated episodes of vomiting or nausea. They have periods of normal health in between episodes. Episodes of nausea and vomiting can last for hours or sometimes days. This condition is most commonly diagnosed in children, but is now being diagnosed in adults more often. People will usually have many tests by a doctor before they are diagnosed with this condition.     It is not known what causes cyclic vomiting syndrome. The diagnosis of CVS has been linked to migraines, food allergies and chronic use of marijuana. In some cases, treatment of these disorders has improved symptoms of CVS. The treatment of CVS is to control the symptoms. Most of the time it can be treated at home with medication for nausea and drinking fluids. Rarely, you will need to be admitted to the hospital to get IV fluids and IV medications    Having this disease can be very hard. People often have problems in everyday life, work or school. People are often fatigued (tired) and very thirsty (dehydration) during episodes of CVS. You might need another exam or more tests to find out why you have these symptoms. At this time, your symptoms do not seem to be caused by anything dangerous. You do not need to stay in the hospital.    Though we don't believe your condition is dangerous right now, it is important to be careful. Sometimes a problem that seems mild can become serious later. This is why it is very important that you return here or go to the nearest Emergency Department if you are not improving or your symptoms are getting worse.    Some things you can try at home to improve symptoms are:   Nausea medications.   Drinking small amounts of fluid often to avoid dehydration.    Avoiding low blood sugar (hypoglycemia).   Rest.   Trying to get rid of stressful things in your life.   Staying  away from any recreational drugs, especially marijuana.   Staying away from narcotic pain medications.    You should come back here or go to the nearest Emergency Department or follow up with your doctor in 2 days.    YOU SHOULD SEEK MEDICAL ATTENTION IMMEDIATELY, EITHER HERE OR AT THE NEAREST EMERGENCY DEPARTMENT, IF ANY OF THE FOLLOWING OCCUR:     You cannot keep fluids down or you have symptoms of dehydration. These can be sunken eyes, fast heart rate, lightheadedness or dizziness.   You do not feel better after treatment.   You have severe abdominal (belly) pain or pain that is getting worse.   You have chest pain or shortness of breath.    If you can t follow up with your doctor, or if at any time you feel you need to be rechecked or seen again, come back here or go to the nearest emergency department.

## 2020-04-01 ENCOUNTER — Emergency Department
Admission: EM | Admit: 2020-04-01 | Discharge: 2020-04-01 | Disposition: A | Discharge status: Home / Self Care | Payer: No Typology Code available for payment source | Source: Home / Self Care | Attending: Emergency Medical Services | Admitting: Emergency Medical Services

## 2020-04-01 ENCOUNTER — Telehealth (INDEPENDENT_AMBULATORY_CARE_PROVIDER_SITE_OTHER): Payer: No Typology Code available for payment source | Admitting: Family Medicine

## 2020-04-01 ENCOUNTER — Encounter (INDEPENDENT_AMBULATORY_CARE_PROVIDER_SITE_OTHER): Payer: Self-pay | Admitting: Family Medicine

## 2020-04-01 DIAGNOSIS — R87610 Atypical squamous cells of undetermined significance on cytologic smear of cervix (ASC-US): Secondary | ICD-10-CM

## 2020-04-01 DIAGNOSIS — Z79899 Other long term (current) drug therapy: Secondary | ICD-10-CM | POA: Insufficient documentation

## 2020-04-01 DIAGNOSIS — R1115 Cyclical vomiting syndrome unrelated to migraine: Secondary | ICD-10-CM | POA: Insufficient documentation

## 2020-04-01 DIAGNOSIS — R112 Nausea with vomiting, unspecified: Secondary | ICD-10-CM

## 2020-04-01 DIAGNOSIS — R87810 Cervical high risk human papillomavirus (HPV) DNA test positive: Secondary | ICD-10-CM

## 2020-04-01 LAB — RAPID DRUG SCREEN, URINE
Barbiturate Screen, UR: NEGATIVE
Benzodiazepine Screen, UR: NEGATIVE
Cannabinoid Screen, UR: POSITIVE — AB
Cocaine, UR: NEGATIVE
Opiate Screen, UR: NEGATIVE
PCP Screen, UR: NEGATIVE
Urine Amphetamine Screen: NEGATIVE

## 2020-04-01 LAB — CBC
Absolute NRBC: 0 10*3/uL (ref 0.00–0.00)
Hematocrit: 40.7 % (ref 34.7–43.7)
Hgb: 13.8 g/dL (ref 11.4–14.8)
MCH: 30.9 pg (ref 25.1–33.5)
MCHC: 33.9 g/dL (ref 31.5–35.8)
MCV: 91.1 fL (ref 78.0–96.0)
MPV: 9.5 fL (ref 8.9–12.5)
Nucleated RBC: 0 /100 WBC (ref 0.0–0.0)
Platelets: 313 10*3/uL (ref 142–346)
RBC: 4.47 10*6/uL (ref 3.90–5.10)
RDW: 14 % (ref 11–15)
WBC: 13.26 10*3/uL — ABNORMAL HIGH (ref 3.10–9.50)

## 2020-04-01 LAB — COMPREHENSIVE METABOLIC PANEL
ALT: 12 U/L (ref 0–55)
AST (SGOT): 15 U/L (ref 5–34)
Albumin/Globulin Ratio: 1.4 (ref 0.9–2.2)
Albumin: 4.3 g/dL (ref 3.5–5.0)
Alkaline Phosphatase: 55 U/L (ref 37–106)
Anion Gap: 15 (ref 5.0–15.0)
BUN: 7 mg/dL (ref 7–19)
Bilirubin, Total: 0.6 mg/dL (ref 0.2–1.2)
CO2: 18 mEq/L — ABNORMAL LOW (ref 22–29)
Calcium: 9.7 mg/dL (ref 8.5–10.5)
Chloride: 104 mEq/L (ref 100–111)
Creatinine: 0.8 mg/dL (ref 0.6–1.0)
Globulin: 3 g/dL (ref 2.0–3.6)
Glucose: 98 mg/dL (ref 70–100)
Potassium: 3.8 mEq/L (ref 3.5–5.1)
Protein, Total: 7.3 g/dL (ref 6.0–8.3)
Sodium: 137 mEq/L (ref 136–145)

## 2020-04-01 LAB — URINALYSIS REFLEX TO MICROSCOPIC EXAM - REFLEX TO CULTURE
Bilirubin, UA: NEGATIVE
Blood, UA: NEGATIVE
Glucose, UA: NEGATIVE
Ketones UA: 80 — AB
Nitrite, UA: NEGATIVE
Protein, UR: 100 — AB
Specific Gravity UA: 1.025 (ref 1.001–1.035)
Urine pH: 6 (ref 5.0–8.0)
Urobilinogen, UA: NORMAL mg/dL (ref 0.2–2.0)

## 2020-04-01 LAB — HCG, SERUM, QUALITATIVE: Hcg Qualitative: NEGATIVE

## 2020-04-01 LAB — GFR: EGFR: >60

## 2020-04-01 LAB — LIPASE: Lipase: 31 U/L (ref 8–78)

## 2020-04-01 MED ORDER — PROCHLORPERAZINE EDISYLATE 10 MG/2ML IJ SOLN
10.0000 mg | Freq: Once | INTRAMUSCULAR | Status: AC
Start: 2020-04-01 — End: 2020-04-01
  Administered 2020-04-01: 18:00:00 10 mg via INTRAVENOUS
  Filled 2020-04-01: qty 2

## 2020-04-01 MED ORDER — SODIUM CHLORIDE 0.9 % IV BOLUS
1000.0000 mL | Freq: Once | INTRAVENOUS | Status: AC
Start: 2020-04-01 — End: 2020-04-01
  Administered 2020-04-01: 20:00:00 1000 mL via INTRAVENOUS

## 2020-04-01 MED ORDER — SODIUM CHLORIDE 0.9 % IV BOLUS
1000.0000 mL | Freq: Once | INTRAVENOUS | Status: AC
Start: 2020-04-01 — End: 2020-04-01
  Administered 2020-04-01: 17:00:00 1000 mL via INTRAVENOUS

## 2020-04-01 MED ORDER — ONDANSETRON HCL 4 MG/2ML IJ SOLN
4.0000 mg | Freq: Once | INTRAMUSCULAR | Status: AC
Start: 2020-04-01 — End: 2020-04-01
  Administered 2020-04-01: 17:00:00 4 mg via INTRAVENOUS
  Filled 2020-04-01: qty 2

## 2020-04-01 MED ORDER — MORPHINE SULFATE 4 MG/ML IJ/IV SOLN (WRAP)
4.0000 mg | Freq: Once | Status: AC
Start: 2020-04-01 — End: 2020-04-01
  Administered 2020-04-01: 21:00:00 4 mg via INTRAVENOUS
  Filled 2020-04-01: qty 1

## 2020-04-01 MED ORDER — KETOROLAC TROMETHAMINE 15 MG/ML IJ SOLN
15.0000 mg | Freq: Once | INTRAMUSCULAR | Status: AC
Start: 2020-04-01 — End: 2020-04-01
  Administered 2020-04-01: 19:00:00 15 mg via INTRAVENOUS
  Filled 2020-04-01: qty 1

## 2020-04-01 MED ORDER — ONDANSETRON HCL 4 MG/2ML IJ SOLN
4.0000 mg | Freq: Once | INTRAMUSCULAR | Status: AC
Start: 2020-04-01 — End: 2020-04-01
  Administered 2020-04-01: 21:00:00 4 mg via INTRAVENOUS
  Filled 2020-04-01: qty 2

## 2020-04-01 NOTE — ED Provider Notes (Signed)
History     Chief Complaint   Patient presents with   . Emesis   . Nausea     Bridget [name] is a 24 y.o. female with history of asthma, POTS, here for evaluation cyclical nausea and vomiting over the past 3 days.  Patient has had this before, states nothing has really helped in the past.  States that this usually runs its course and then stops.  Patient states that she has been throwing up, too numerous to count.  Had one episode of diarrhea today, no other fever shortness of breath chest pain.  Has some associated generalized abdominal discomfort.  No other urinary symptoms, no chance of pregnancy.  Mother states that menstruation causes this to start up as well.  Patient vapes, no alcohol use, patient also smokes marijuana.  Patient was seen for this 2 days ago in [place] was discharged, had a virtual visit today with [facility] and was advised to come into the ED for further evaluation.  Patient has a follow-up with GI-Dr. Ernst Bowler in a couple days.              Past Medical History:   Diagnosis Date   . Abdominal pain    . Asthma     mild seasonal controlled   . Complication of anesthesia    . Post-operative nausea and vomiting    . POTS (postural orthostatic tachycardia syndrome)     [facility] [place] many yrs   . Sinus trouble     seasonal   . Yeast infection involving the vagina and surrounding area 05/09/2015       Past Surgical History:   Procedure Laterality Date   . ADENOIDECTOMY     . EGD N/A 01/23/2018    Procedure: EGD;  Surgeon: Jacqulyn Cane, MD;  Location: [facility];  Service: Gastroenterology;  Laterality: N/A;  EGD  Q1=N/A   . TONSILLECTOMY     . WISDOM TOOTH EXTRACTION         Family History   Problem Relation Age of Onset   . Cancer Mother    . Ehlers-Danlos syndrome Sister    . Asthma Brother        Social  Social History     Tobacco Use   . Smoking status: Never Smoker   . Smokeless tobacco: Never Used   . Tobacco comment: the  patient vapes nicotine    Vaping Use   . Vaping Use: Every day   . Start date: 04/14/2018   Substance Use Topics   . Alcohol use: Not Currently   . Drug use: Yes     Comment: Pt. uses marijuana every day. Pt. reports not using it for the past 3 days.       .     Allergies   Allergen Reactions   . Latex Itching     Itching irritation swelling condoms  Itching irritation swelling condoms  Itching irritation swelling condoms       Home Medications     Med List Status: Pharmacy Completed Set By: Noralee Chars, RPH at 04/01/2020  7:43 PM                metoclopramide (REGLAN) 10 MG tablet     metoclopramide 10 mg tablet   Take 1 tablet (10 mg total) by mouth 3 (three) times daily before meals for 14 days     nitrofurantoin, macrocrystal-monohydrate, (MACROBID) 100 MG capsule  Take 1 tab by mouth as a single dose taken within 2 hours of sexual intercourse     norethindrone-ethinyl estradiol (ORTHO-NOVUM 1/35) 1-35 MG-MCG per tablet     Take 1 tablet by mouth daily     ondansetron (ZOFRAN-ODT) 4 MG disintegrating tablet     Take 1 tablet (4 mg total) by mouth every 6 (six) hours as needed for Nausea     promethazine (PHENERGAN) 12.5 MG suppository     promethazine 12.5 mg rectal suppository   Place 1 suppository (12.5 mg total) rectally every 6 (six) hours as needed for Nausea                     Review of Systems   Constitutional: Negative for fever.   HENT: Negative for congestion.    Eyes: Negative for visual disturbance.   Respiratory: Negative for cough, shortness of breath and wheezing.    Cardiovascular: Negative for chest pain and leg swelling.   Gastrointestinal: Positive for abdominal pain, diarrhea, nausea and vomiting.   Genitourinary: Negative for dysuria and flank pain.   Musculoskeletal: Negative for arthralgias and back pain.   Skin: Negative for pallor.   Neurological: Negative for headaches.       Physical Exam    BP: (!) 137/94, Heart Rate: 90, Temp: 98.4 F (36.9 C), Resp Rate: 22, SpO2: 98 %,  Weight: 46.1 kg    Physical Exam  Vitals and nursing note reviewed.   Constitutional:       Appearance: Normal appearance.   HENT:      Head: Normocephalic and atraumatic.   Eyes:      Extraocular Movements: Extraocular movements intact.      Pupils: Pupils are equal, round, and reactive to light.   Cardiovascular:      Rate and Rhythm: Normal rate.   Pulmonary:      Effort: Pulmonary effort is normal. No respiratory distress.   Abdominal:      General: There is no distension.      Tenderness: There is abdominal tenderness.      Comments: Generalized abdominal tenderness to palpation   Neurological:      Mental Status: She is alert and oriented to person, place, and time.   Psychiatric:         Mood and Affect: Mood normal.           MDM and ED Course     ED Medication Orders (From admission, onward)    Start Ordered     Status Ordering Provider    04/01/20 2040 04/01/20 2039  morphine injection 4 mg  Once     Route: Intravenous  Ordered Dose: 4 mg     Last MAR action: Given Shontell Prosser    04/01/20 2040 04/01/20 2039  ondansetron (ZOFRAN) injection 4 mg  Once     Route: Intravenous  Ordered Dose: 4 mg     Last MAR action: Given Kinisha Soper    04/01/20 1914 04/01/20 1913  sodium chloride 0.9 % bolus 1,000 mL  Once     Route: Intravenous  Ordered Dose: 1,000 mL     Last MAR action: New Bag Damonie Furney    04/01/20 1845 04/01/20 1844  ketorolac (TORADOL) injection 15 mg  Once     Route: Intravenous  Ordered Dose: 15 mg     Last MAR action: Given Temprence Rhines    04/01/20 1733 04/01/20 1732  prochlorperazine (COMPAZINE) injection 10 mg  Once  Route: Intravenous  Ordered Dose: 10 mg     Last MAR action: Given Tyriek Hofman    04/01/20 1645 04/01/20 1644  sodium chloride 0.9 % bolus 1,000 mL  Once     Route: Intravenous  Ordered Dose: 1,000 mL     Last MAR action: Stopped Demiya Magno    04/01/20 1645 04/01/20 1644  ondansetron (ZOFRAN) injection 4 mg  Once     Route: Intravenous  Ordered Dose: 4 mg     Last MAR action:  Given Devarious Pavek             MDM  Number of Diagnoses or Management Options  Cyclical vomiting  Diagnosis management comments: Patient here for evaluation of nausea and vomiting with the past several days, too numerous to count.  Patient has associated generalized abdominal discomfort as well.  No other urinary symptoms fever shortness of breath chest pain or other concerns.  Vital signs within normal limits.  Patient states that she is unable to keep anything down.  Was advised by [facility] family practice to come into the ED for further evaluation  We will obtain screening labs give fluids antiemetics and monitor  Labs show no acute concerns, patient was monitored given 2 L of fluids  After further evaluation symptoms have subsided.  Discussed with mother  They feel comfortable being discharged will monitor symptoms at home use antiemetics at home advised to return for any other concerns worsening                   Procedures    Clinical Impression & Disposition     Clinical Impression  Final diagnoses:   Cyclical vomiting        ED Disposition     ED Disposition Condition Date/Time Comment    Discharge  Tue Apr 01, 2020  9:10 PM Laymond Purser discharge to home/self care.    Condition at disposition: Stable           New Prescriptions    No medications on file                 Nettie Elm, [place]  04/01/20 2110

## 2020-04-01 NOTE — ED Triage Notes (Signed)
Pt been vomiting since sunday night, seen in [facility] ed yesteday and still vomiting today. Last marijuana use saturday.

## 2020-04-01 NOTE — Discharge Instructions (Signed)
Monitor symptoms for any worsening. Followup with your family doctor as discussed. Take any medications prescribed to you as directed. Return for any other concerns or for worsening. Please read all patient education sheets.     followup as discussed. Return for any concerns.         You were seen today by Bridget Elm, PA-C. Thank you for choosing the [facility] for your healthcare needs. We hope your visit today was EXCELLENT.    Follow up with your doctor tomorrow if any symptoms persists.   Please take any medications prescribed as directed.   If you have any questions or concerns, I am available at [phone]. Please do not hesitate to contact me if I can be of assistance.   Below is some information and resources that our patients often find helpful.   Sincerely,   Leodis Sias Department of Emergency Medicine   ________________________________________________________________   Thank you for choosing [facility] for your emergency care needs. We strive to provide EXCELLENT care to you and your family.   IF YOU DO NOT CONTINUE TO IMPROVE OR YOUR CONDITION WORSENS, PLEASE CONTACT YOUR DOCTOR OR RETURN IMMEDIATELY TO THE EMERGENCY DEPARTMENT.   DOCTOR REFERRALS   Call [phone] (available 24 hours a day, 7 days a week) if you need any further referrals and we can help you find a primary care doctor or specialist. Also, available online at: [email or web address]   YOUR CONTACT INFORMATION   Before leaving please check with registration to make sure we have an up-to-date contact number. You can call registration at [phone] to update your information. For questions about your hospital bill, please call [phone]. For questions about your Emergency Dept Physician bill please call [phone].   FREE HEALTH SERVICES   If you need help with health or social services, please call 2-1-1 for a free referral to resources in your area.  2-1-1 is a free service connecting people with information on health insurance, free clinics, pregnancy, mental health, dental care, food assistance, housing, and substance abuse counseling. Also, available online at: http://www.[facility].org   MEDICAL RECORDS AND TESTS   Certain laboratory test results do not come back the same day, for example urine cultures. We will contact you if other important findings are noted. Radiology films are often reviewed again to ensure accuracy. If there is any discrepancy, we will notify you.   Please call [phone] to pick up a complimentary CD of any radiology studies performed. If you or your doctor would like to request a copy of your medical records, please call [phone].   ORTHOPEDIC INJURY   Please know that significant injuries can exist even when an initial x-ray is read as normal or negative. This can occur because some fractures (broken bones) are not initially visible on x-rays. For this reason, close outpatient follow-up with your primary care doctor or bone specialist (orthopedist) is required.   MEDICATIONS AND FOLLOWUP   Please be aware that some prescription medications can cause drowsiness. Use caution when driving or operating machinery.   The examination and treatment you have received in our Emergency Department is provided on an emergency basis, and is not intended to be a substitute for your primary care physician. It is important that your doctor checks you again and that you report any new or remaining problems at that time.   24 HOUR PHARMACIES  [facility] - [address] [place], [place]  [address] (1.4 miles, 7 minutes)   [facility] - [address], [place], [place] [address] (6.5 miles, 13 minutes)   Handout with directions available on request.

## 2020-04-01 NOTE — Progress Notes (Signed)
[facility]                       Date of Virtual Visit: 04/01/2020 3:36 PM        Patient ID: Bridget [name] is a 24 y.o. female.  Attending Physician: Cephus Richer, MD       Telemedicine Eligibility:      For your treatment today, do you prefer that we bill you directly or submit to insurance?    []  Bill patient directly     [x]  Submit claim to insurance    State Location:    [x]  [place]  []  [place]  []  [place] []  [place]    []  Other (COVID related only - specify location):    Patient Identity Verification:    []  State Issued ID  []  DOB / photo ID  [x]  Other (specify):  Dob & pt known to practice        Chief Complaint:    ER follow up               HPI:      History provided by mother.  Patient in bed adjacent to mom    Patient with h/o POTS, ED and cyclic vomiting.  Was in ER yesterday for flare of cyclic vomiting that started on 8/15.  Received fluids and discharged, but unfortunately vomiting has increased frequency.  Very little produced - mostly bile.  Also with abdominal pain / can't rest.  Doesn't recall last UOP.  Very little fluids.  Currently taking reglan, phenergan and zofran.     Notes that has had daily nausea and no appetite for a long time.  Started smoking marijuana in high school - daily user - reluctant to stop because helps with nausea and improves appetite.      In past when has had these episodes, has correlated to urinary tract infection.  Past 2 u/a remarkable for dehydration.  Also notes that episodes can be related to menses - reason for continuous OCP    Has appt with Dr. Ernst Bowler in 2 days                  Problem List:    Patient Active Problem List   Diagnosis   . Postcoital UTI   . Asthma   . Delayed gastric emptying   . Ehlers-Danlos syndrome   . Endometriosis   . Postural orthostatic tachycardia syndrome   . Intractable vomiting with nausea   . ASCUS with positive high risk HPV cervical   . Cyclical vomiting              Current Meds:    Current Outpatient Medications   Medication Sig Dispense Refill   . nitrofurantoin, macrocrystal-monohydrate, (MACROBID) 100 MG capsule Take 1 tab by mouth as a single dose taken within 2 hours of sexual intercourse 15 capsule 0   . norethindrone-ethinyl estradiol (ORTHO-NOVUM 1/35) 1-35 MG-MCG per tablet Take 1 tablet by mouth daily 84 tablet 3   . ondansetron (ZOFRAN-ODT) 4 MG disintegrating tablet Take 1 tablet (4 mg total) by mouth every 6 (six) hours as needed for Nausea 8 tablet 0   . metoclopramide (REGLAN) 10 MG tablet metoclopramide 10 mg tablet   Take 1 tablet (10 mg total) by mouth 3 (three) times daily before meals for 14 days     . ondansetron (ZOFRAN) 24 MG tablet Take 24 mg by mouth  daily as needed for Nausea     . promethazine (PHENERGAN) 12.5 MG suppository promethazine 12.5 mg rectal suppository   Place 1 suppository (12.5 mg total) rectally every 6 (six) hours as needed for Nausea       No current facility-administered medications for this visit.          Allergies:    Allergies   Allergen Reactions   . Latex Itching     Itching irritation swelling condoms  Itching irritation swelling condoms  Itching irritation swelling condoms             Past Surgical History:    Past Surgical History:   Procedure Laterality Date   . ADENOIDECTOMY     . EGD N/A 01/23/2018    Procedure: EGD;  Surgeon: Jacqulyn Cane, MD;  Location: [facility];  Service: Gastroenterology;  Laterality: N/A;  EGD  Q1=N/A   . TONSILLECTOMY     . WISDOM TOOTH EXTRACTION             Family History:    Family History   Problem Relation Age of Onset   . Cancer Mother    . Ehlers-Danlos syndrome Sister    . Asthma Brother            Social History:    Social History     Tobacco Use   . Smoking status: Never Smoker   . Smokeless tobacco: Never Used   . Tobacco comment: the patient vapes nicotine    Vaping Use   . Vaping Use: Every day   . Start date: 04/14/2018   Substance Use Topics   . Alcohol use: Not  Currently   . Drug use: Yes     Comment: Pt. uses marijuana every day. Pt. reports not using it for the past 3 days.          The following sections were reviewed this encounter by the provider:   Tobacco  Allergies  Meds  Problems  Med Hx  Surg Hx  Fam Hx             Vital Signs:    LMP 03/03/2020 (Approximate)          ROS:    Review of Systems          Physical Exam:      GENERAL APPEARANCE: pale with vomiting throughout call          Assessment:    1. Intractable vomiting with nausea    2. Cyclical vomiting    3. ASCUS with positive high risk HPV cervical            Plan:      Unclear if secondary to cyclic vomiting syndrome vs cannabis hyperemesis syndrome  Regardless, need to get current flare under control - suggest ER for possible admit to observation so can get fluids and bowel rest  Follow up with Dr. Ernst Bowler  Will be seeing gyn soon as well for follow up pap - recommend asking about lower estrogen continuous pill          Follow-up:             Cephus Richer, MD

## 2020-04-01 NOTE — ED Notes (Signed)
Pt states the nausea is "okay"

## 2020-04-02 ENCOUNTER — Telehealth (INDEPENDENT_AMBULATORY_CARE_PROVIDER_SITE_OTHER): Payer: Self-pay | Admitting: Family Medicine

## 2020-04-02 ENCOUNTER — Observation Stay
Admission: EM | Admit: 2020-04-02 | Discharge: 2020-04-03 | Disposition: A | Discharge status: Home / Self Care | Payer: No Typology Code available for payment source | Attending: Family Medicine | Admitting: Family Medicine

## 2020-04-02 ENCOUNTER — Encounter (INDEPENDENT_AMBULATORY_CARE_PROVIDER_SITE_OTHER): Payer: Self-pay

## 2020-04-02 DIAGNOSIS — K30 Functional dyspepsia: Secondary | ICD-10-CM | POA: Insufficient documentation

## 2020-04-02 DIAGNOSIS — Q796 Ehlers-Danlos syndrome, unspecified: Secondary | ICD-10-CM | POA: Insufficient documentation

## 2020-04-02 DIAGNOSIS — T7840XA Allergy, unspecified, initial encounter: Secondary | ICD-10-CM

## 2020-04-02 DIAGNOSIS — R1115 Cyclical vomiting syndrome unrelated to migraine: Secondary | ICD-10-CM

## 2020-04-02 DIAGNOSIS — Z87440 Personal history of urinary (tract) infections: Secondary | ICD-10-CM | POA: Insufficient documentation

## 2020-04-02 DIAGNOSIS — N926 Irregular menstruation, unspecified: Secondary | ICD-10-CM | POA: Insufficient documentation

## 2020-04-02 DIAGNOSIS — R112 Nausea with vomiting, unspecified: Principal | ICD-10-CM | POA: Insufficient documentation

## 2020-04-02 DIAGNOSIS — F1290 Cannabis use, unspecified, uncomplicated: Secondary | ICD-10-CM

## 2020-04-02 DIAGNOSIS — I498 Other specified cardiac arrhythmias: Secondary | ICD-10-CM | POA: Insufficient documentation

## 2020-04-02 DIAGNOSIS — J45909 Unspecified asthma, uncomplicated: Secondary | ICD-10-CM | POA: Insufficient documentation

## 2020-04-02 DIAGNOSIS — R110 Nausea: Secondary | ICD-10-CM

## 2020-04-02 DIAGNOSIS — R1084 Generalized abdominal pain: Secondary | ICD-10-CM

## 2020-04-02 LAB — CBC AND DIFFERENTIAL
Absolute NRBC: 0 10*3/uL (ref 0.00–0.00)
Basophils Absolute Automated: 0.04 10*3/uL (ref 0.00–0.08)
Basophils Automated: 0.4 %
Eosinophils Absolute Automated: 0 10*3/uL (ref 0.00–0.44)
Eosinophils Automated: 0 %
Hematocrit: 42.7 % (ref 34.7–43.7)
Hgb: 14.4 g/dL (ref 11.4–14.8)
Immature Granulocytes Absolute: 0.05 10*3/uL (ref 0.00–0.07)
Immature Granulocytes: 0.5 %
Lymphocytes Absolute Automated: 1.69 10*3/uL (ref 0.42–3.22)
Lymphocytes Automated: 17.9 %
MCH: 30.7 pg (ref 25.1–33.5)
MCHC: 33.7 g/dL (ref 31.5–35.8)
MCV: 91 fL (ref 78.0–96.0)
MPV: 9.5 fL (ref 8.9–12.5)
Monocytes Absolute Automated: 0.46 10*3/uL (ref 0.21–0.85)
Monocytes: 4.9 %
Neutrophils Absolute: 7.22 10*3/uL — ABNORMAL HIGH (ref 1.10–6.33)
Neutrophils: 76.3 %
Nucleated RBC: 0 /100 WBC (ref 0.0–0.0)
Platelets: 308 10*3/uL (ref 142–346)
RBC: 4.69 10*6/uL (ref 3.90–5.10)
RDW: 13 % (ref 11–15)
WBC: 9.46 10*3/uL (ref 3.10–9.50)

## 2020-04-02 LAB — COMPREHENSIVE METABOLIC PANEL
ALT: 14 U/L (ref 0–55)
AST (SGOT): 21 U/L (ref 5–34)
Albumin/Globulin Ratio: 1.3 (ref 0.9–2.2)
Albumin: 4.3 g/dL (ref 3.5–5.0)
Alkaline Phosphatase: 54 U/L (ref 37–106)
Anion Gap: 20 — ABNORMAL HIGH (ref 5.0–15.0)
BUN: 6 mg/dL — ABNORMAL LOW (ref 7–19)
Bilirubin, Total: 0.7 mg/dL (ref 0.2–1.2)
CO2: 12 mEq/L — ABNORMAL LOW (ref 22–29)
Calcium: 9.7 mg/dL (ref 8.5–10.5)
Chloride: 106 mEq/L (ref 100–111)
Creatinine: 0.8 mg/dL (ref 0.6–1.0)
Globulin: 3.3 g/dL (ref 2.0–3.6)
Glucose: 70 mg/dL (ref 70–100)
Potassium: 4.5 mEq/L (ref 3.5–5.1)
Protein, Total: 7.6 g/dL (ref 6.0–8.3)
Sodium: 138 mEq/L (ref 136–145)

## 2020-04-02 LAB — URINALYSIS REFLEX TO MICROSCOPIC EXAM - REFLEX TO CULTURE
Bilirubin, UA: NEGATIVE
Blood, UA: NEGATIVE
Glucose, UA: NEGATIVE
Ketones UA: 80 — AB
Leukocyte Esterase, UA: NEGATIVE
Nitrite, UA: NEGATIVE
Protein, UR: 100 — AB
Specific Gravity UA: 1.025 (ref 1.001–1.035)
Urine pH: 6 (ref 5.0–8.0)
Urobilinogen, UA: NORMAL mg/dL (ref 0.2–2.0)

## 2020-04-02 LAB — HCG, SERUM, QUALITATIVE: Hcg Qualitative: NEGATIVE

## 2020-04-02 LAB — GFR: EGFR: >60

## 2020-04-02 LAB — LIPASE: Lipase: 17 U/L (ref 8–78)

## 2020-04-02 MED ORDER — DEXTROSE-SODIUM CHLORIDE 5-0.45 % IV SOLN
INTRAVENOUS | Status: DC
Start: 2020-04-02 — End: 2020-04-03

## 2020-04-02 MED ORDER — ONDANSETRON HCL 4 MG/2ML IJ SOLN
4.0000 mg | INTRAMUSCULAR | Status: DC | PRN
Start: 2020-04-02 — End: 2020-04-03

## 2020-04-02 MED ORDER — PROCHLORPERAZINE EDISYLATE 10 MG/2ML IJ SOLN
10.0000 mg | Freq: Once | INTRAMUSCULAR | Status: AC
Start: 2020-04-02 — End: 2020-04-02
  Administered 2020-04-02: 19:00:00 10 mg via INTRAVENOUS
  Filled 2020-04-02: qty 2

## 2020-04-02 MED ORDER — MORPHINE SULFATE 4 MG/ML IJ/IV SOLN (WRAP)
4.0000 mg | Freq: Once | Status: AC
Start: 2020-04-02 — End: 2020-04-02
  Administered 2020-04-02: 19:00:00 4 mg via INTRAVENOUS
  Filled 2020-04-02: qty 1

## 2020-04-02 MED ORDER — SODIUM CHLORIDE 0.9 % IV BOLUS
2000.0000 mL | Freq: Once | INTRAVENOUS | Status: AC
Start: 2020-04-02 — End: 2020-04-02
  Administered 2020-04-02: 19:00:00 2000 mL via INTRAVENOUS

## 2020-04-02 MED ORDER — PROCHLORPERAZINE MALEATE 10 MG PO TABS
10.0000 mg | ORAL_TABLET | Freq: Four times a day (QID) | ORAL | 0 refills | Status: DC | PRN
Start: 2020-04-02 — End: 2020-04-02

## 2020-04-02 MED ORDER — DIPHENHYDRAMINE HCL 50 MG/ML IJ SOLN
25.0000 mg | Freq: Once | INTRAMUSCULAR | Status: AC
Start: 2020-04-02 — End: 2020-04-02
  Administered 2020-04-02: 19:00:00 25 mg via INTRAVENOUS
  Filled 2020-04-02: qty 1

## 2020-04-02 MED ORDER — PROCHLORPERAZINE EDISYLATE 10 MG/2ML IJ SOLN
10.0000 mg | Freq: Once | INTRAMUSCULAR | Status: DC
Start: 2020-04-02 — End: 2020-04-02
  Filled 2020-04-02: qty 2

## 2020-04-02 MED ORDER — METOCLOPRAMIDE HCL 5 MG/ML IJ SOLN
10.0000 mg | Freq: Once | INTRAMUSCULAR | Status: AC
Start: 2020-04-02 — End: 2020-04-02
  Administered 2020-04-02: 22:00:00 10 mg via INTRAVENOUS
  Filled 2020-04-02: qty 2

## 2020-04-02 MED ORDER — ONDANSETRON HCL 4 MG/2ML IJ SOLN
4.0000 mg | Freq: Once | INTRAMUSCULAR | Status: AC
Start: 2020-04-02 — End: 2020-04-02
  Administered 2020-04-02: 22:00:00 4 mg via INTRAVENOUS
  Filled 2020-04-02: qty 2

## 2020-04-02 MED ORDER — HYDROMORPHONE HCL 1 MG/ML IJ SOLN
1.0000 mg | INTRAMUSCULAR | Status: AC | PRN
Start: 2020-04-02 — End: 2020-04-03
  Administered 2020-04-03: 1 mg via INTRAVENOUS
  Filled 2020-04-02: qty 1

## 2020-04-02 MED ORDER — DEXTROSE-SODIUM CHLORIDE 5-0.9 % IV SOLN
INTRAVENOUS | Status: AC
Start: 2020-04-02 — End: 2020-04-02

## 2020-04-02 MED ORDER — DIPHENHYDRAMINE HCL 50 MG/ML IJ SOLN
25.0000 mg | Freq: Once | INTRAMUSCULAR | Status: AC
Start: 2020-04-02 — End: 2020-04-02
  Administered 2020-04-02: 22:00:00 25 mg via INTRAVENOUS
  Filled 2020-04-02: qty 1

## 2020-04-02 NOTE — Telephone Encounter (Signed)
Pt's mother called to speak to clinical member on next steps. Pt had VV with provider and later on was seen in ER  But was not admitted. Pt's mother states pt is still unable to keep anything down and wants to speak to a clinical member regarding next steps. Please address and contact pt's mom at [facility] # on file.

## 2020-04-02 NOTE — H&P (Signed)
[facility] Admission H&P  Physician Available 24 hours a day:  Please use number in sticky note to contact hospital team.  Office#: [phone]     Date Time: 04/02/20 11:26 PM  Patient Name: Bridget [name]  Attending Physician: Barnie Alderman, MD  Primary Care Physician: Reynold Bowen, MD    CC: Cyclic vomiting syndrome     Assessment:     Active Hospital Problems    Diagnosis   . Cyclic vomiting syndrome       24 y.o. female with known history of cyclic vomiting syndrome, Ehlers-Danlos syndrome, POTS, delayed gastric emptying, asthma, and chronic UTIs, presenting to the ED with intractable vomiting that has been persistent since Sunday 8/15, admitted for control of nausea and vomiting and IV hydration.    Plan:     #Intractable vomiting   - s/p migraine cocktail in ED with compazine, benadryl x2, morphine, zofran, reglan, and IVF   - Migraine cocktail now: Compazine 10mg  IV, benadryl 12.5mg  IV   - Reglan 10mg  IV Q8h scheduled   - Protonix 40mg  IV daily  - Zofran 4mg  IV Q6h PRN  - mIVF with D5NS @100cc /hr  - Consider GI consult in AM, pt followed by Dr. Ernst Bowler [facility]) and was scheduled to be seen in office on 8/19  - Counseling regarding marijuana cessation, as likely contributing to cyclic vomiting     #Chronic:  . Chronic UTIs - on prophylactic Macrobid to prevent post-coital UTI, has not taken this in more than 1 month   . Asthma - not on daily inhaler  . Postural orthostatic tachycardia syndrome (POTS)  . Ehlers-Danlos syndrome   . Contraception/irregular menses - continue home OCP    #FEN - Diet NPO effective now      #PPX  - DVT - SCDs, SC lovenox  - GI - protonix    CODE STATUS: Full Code     DISPO:  Based on the information available on the day of this admission, patient will be admitted to service status:  Observation:patient is stable and improving.  Anticipated medical stability for discharge:Yellow - maybe tomorrow  Anticipated discharge needs: TBD    History of Presenting Illness:    Bridget [name] is a 24 y.o. female with known history of cyclic vomiting syndrome, Ehlers-Danlos syndrome, POTS, delayed gastric emptying, asthma, and chronic UTIs, presenting to the ED with intractable vomiting that has been persistent since Sunday 8/15. She has been having issues with attacks of cyclic vomiting for the past 2.5 years, was recently admitted to [facility] from June 16-18 2021 for similar presentation, at which time she was treated with IV antiemetics and discharged with zofran, reglan and phenergan. She was advised to follow up with [facility] Dr. Ernst Bowler, and has an appointment scheduled for tomorrow 8/19. Since that admission, she has had intermittent nausea and vomiting, notes she vomits on average around 4-5 times per month when she is not experiencing a flare.     She began experiencing a flare of her cyclic vomiting on Sunday 8/15, and has vomited an uncountable number of times since then, upwards of once every 15-20 minutes. She has been unable to tolerate any PO, has not tried anything other than water, as when she drinks water it comes right back up. She had been taking zofran at home which helped some, but not much help in an acute flare. Pt also endorses smoking marijuana on a regular basis, as this sometimes does help the nausea. She does  occasionally note that vomiting if worse again after smoking. The last time she smoked was Sat 8/14, and she has not smoked since that time while in the middle of a vomiting flare.     She additionally notes the vomiting is sometimes associated with URI symptoms, reports the past 1-2 episodes she experienced were in the context of what seemed like a sinus infection. She also notes consistent headaches during vomiting episodes, describes the pain as a constant dull ache to frontal region. She has never been diagnosed with migraines, though her sister does get migraines and has previously taken a rescue medication for them.     Patient underwent EGD  in 2019 as part of an ongoing GI workup, per pt report, unremarkable EGD though did note some retained food products in the stomach, possibly concerning for delayed gastric emptying.       ED COURSE:    VS: Temp 98.4, HR 76-87, RR 16-18, BP 116/71-148/91   EKG: Normal sinus rhythm, QTc WNL   IMAGING: None   LABS: CBC unremarkable, AG 20, lipase WNL 17, bHCG negative, UA with 100 protein 80 ketones   MEDS: NS IV bolus x2, Compazine 10 mg IV, Benadryl 25 mg IV x2, Reglan 10mg  IV, morphine 4 mg IV, Zofran 4 mg IV, 2L NS IVF    OUTPATIENT SPECIALISTS:  . GI Dr. Laqueta Linden)      Past Medical History:     Past Medical History:   Diagnosis Date   . Abdominal pain    . Asthma     mild seasonal controlled   . Complication of anesthesia    . Post-operative nausea and vomiting    . POTS (postural orthostatic tachycardia syndrome)     [facility]/stable for many yrs   . Sinus trouble     seasonal   . Yeast infection involving the vagina and surrounding area 05/09/2015       Past Surgical History:     Past Surgical History:   Procedure Laterality Date   . ADENOIDECTOMY     . EGD N/A 01/23/2018    Procedure: EGD;  Surgeon: Jacqulyn Cane, MD;  Location: [facility];  Service: Gastroenterology;  Laterality: N/A;  EGD  Q1=N/A   . TONSILLECTOMY     . WISDOM TOOTH EXTRACTION         Family History:     Family History   Problem Relation Age of Onset   . Cancer Mother    . Ehlers-Danlos syndrome Sister    . Asthma Brother        Social History:     Social History     Tobacco Use   . Smoking status: Never Smoker   . Smokeless tobacco: Never Used   . Tobacco comment: the patient vapes nicotine    Vaping Use   . Vaping Use: Every day   . Start date: 04/14/2018   Substance Use Topics   . Alcohol use: Not Currently   . Drug use: Yes     Comment: Pt. uses marijuana every day. Pt. reports not using it for the past 3 days.        Allergies:     Allergies   Allergen Reactions   . Latex Itching      Itching irritation swelling condoms  Itching irritation swelling condoms  Itching irritation swelling condoms   . Compazine [Prochlorperazine] Rash   . Morphine Rash       Medications:  Current/Home Medications    NITROFURANTOIN, MACROCRYSTAL-MONOHYDRATE, (MACROBID) 100 MG CAPSULE    Take 1 tab by mouth as a single dose taken within 2 hours of sexual intercourse    NORETHINDRONE-ETHINYL ESTRADIOL (ORTHO-NOVUM 1/35) 1-35 MG-MCG PER TABLET    Take 1 tablet by mouth daily    ONDANSETRON (ZOFRAN-ODT) 4 MG DISINTEGRATING TABLET    Take 1 tablet (4 mg total) by mouth every 6 (six) hours as needed for Nausea    PROMETHAZINE (PHENERGAN) 12.5 MG SUPPOSITORY    promethazine 12.5 mg rectal suppository   Place 1 suppository (12.5 mg total) rectally every 6 (six) hours as needed for Nausea        Review of Systems:   All other systems were reviewed and are negative except: per HPI    Physical Exam:     VITAL SIGNS: Physical Exam:   Temp:  [98.5 F (36.9 C)-99 F (37.2 C)] 98.5 F (36.9 C)  Heart Rate:  [76-87] 83  Resp Rate:  [16] 16  BP: (116-139)/(71-93) 139/90  There is no height or weight on file to calculate BMI.    Intake/Output Summary (Last 24 hours) at 04/02/2020 2326  Last data filed at 04/02/2020 2039  Gross per 24 hour   Intake 2266.67 ml   Output -   Net 2266.67 ml    GEN: alert and oriented x 3; mild distress 2/2 pain and nausea   HEENT: PERRL, EOMI, sclera anicteric, oropharynx clear without lesions, MMM  NECK: supple, no lymphadenopathy, no thyromegaly, no JVD, no bruits  CARDS: RRR, no murmurs, rubs or gallops  LUNGS: clear to auscultation bilaterally, without wheezing, rhonchi, or rales  ABD: soft, mild epigastric tenderness, non-distended; no palpable masses, no hepatosplenomegaly, normoactive bowel sounds, no rebound or guarding  EXT: no clubbing, cyanosis, or edema  NEURO: cranial nerves grossly intact, strength 5/5 in upper and lower extremities, sensation intact  SKIN: no rashes or lesions noted          Labs Reviewed:     Results     Procedure Component Value Units Date/Time    UA Reflex to Micro - Reflex to Culture [[id number]]  (Abnormal) Collected: 04/02/20 1957     Updated: 04/02/20 2017     Urine Type Urine, Clean Ca     Color, UA Yellow     Clarity, UA Clear     Specific Gravity UA 1.025     Urine pH 6.0     Leukocyte Esterase, UA Negative     Nitrite, UA Negative     Protein, UR 100     Glucose, UA Negative     Ketones UA 80     Urobilinogen, UA Normal mg/dL      Bilirubin, UA Negative     Blood, UA Negative     RBC, UA 0 - 2 /hpf      WBC, UA 0 - 5 /hpf      Squamous Epithelial Cells, Urine 0 - 5 /hpf     Comprehensive metabolic panel [[id number]]  (Abnormal) Collected: 04/02/20 1810    Specimen: Blood Updated: 04/02/20 1842     Glucose 70 mg/dL      BUN 6 mg/dL      Creatinine 0.8 mg/dL      Sodium 782 mEq/L      Potassium 4.5 mEq/L      Chloride 106 mEq/L      CO2 12 mEq/L      Calcium 9.7 mg/dL  Protein, Total 7.6 g/dL      Albumin 4.3 g/dL      AST (SGOT) 21 U/L      ALT 14 U/L      Alkaline Phosphatase 54 U/L      Bilirubin, Total 0.7 mg/dL      Globulin 3.3 g/dL      Albumin/Globulin Ratio 1.3     Anion Gap 20.0    Lipase [[id number]] Collected: 04/02/20 1810    Specimen: Blood Updated: 04/02/20 1842     Lipase 17 U/L     GFR [[id number]] Collected: 04/02/20 1810     Updated: 04/02/20 1842     EGFR >60.0    Beta HCG, Qual, Serum [[id number]] Collected: 04/02/20 1810    Specimen: Blood Updated: 04/02/20 1834     Hcg Qualitative Negative    CBC and differential [[id number]]  (Abnormal) Collected: 04/02/20 1810    Specimen: Blood Updated: 04/02/20 1819     WBC 9.46 x10 3/uL      Hgb 14.4 g/dL      Hematocrit 62.9 %      Platelets 308 x10 3/uL      RBC 4.69 x10 6/uL      MCV 91.0 fL      MCH 30.7 pg      MCHC 33.7 g/dL      RDW 13 %      MPV 9.5 fL      Neutrophils 76.3 %      Lymphocytes Automated 17.9 %      Monocytes 4.9 %      Eosinophils Automated 0.0 %      Basophils Automated 0.4 %      Immature  Granulocytes 0.5 %      Nucleated RBC 0.0 /100 WBC      Neutrophils Absolute 7.22 x10 3/uL      Lymphocytes Absolute Automated 1.69 x10 3/uL      Monocytes Absolute Automated 0.46 x10 3/uL      Eosinophils Absolute Automated 0.00 x10 3/uL      Basophils Absolute Automated 0.04 x10 3/uL      Immature Granulocytes Absolute 0.05 x10 3/uL      Absolute NRBC 0.00 x10 3/uL           Imaging Reviewed:   EKG: NSR, normal EKG    No results found.    Signed by: Irving Shows, MD  Office #: [phone]     NU:[id number], Orma Render, MD

## 2020-04-02 NOTE — EDIE (Signed)
COLLECTIVE?NOTIFICATION?04/02/2020 17:10?[name], [name]?MRN: [id number]    Criteria Met      5 ED Visits in 12 Months    Security and Safety  No recent Security Events currently on file    ED Care Guidelines  There are currently no ED Care Guidelines for this patient. Please check your facility's medical records system.    Flags      Negative COVID-19 Lab Result - [facility] - A specimen collected from this patient was negative for COVID-19 / Attributed By: [place] Department of Health / Attributed On: 03/15/2020       Prescription Monitoring Program  000??- Narcotic Use Score  000??- Sedative Use Score  000??- Stimulant Use Score  000??- Overdose Risk Score  - All Scores range from 000-999 with 75% of the population scoring < 200 and on 1% scoring above 650  - The last digit of the narcotic, sedative, and stimulant score indicates the number of active prescriptions of that type  - Higher Use scores correlate with increased prescribers, pharmacies, mg equiv, and overlapping prescriptions  - Higher Overdose Risk Scores correlate with increased risk of unintentional overdose death   Concerning or unexpectedly high scores should prompt a review of the PMP record; this does not constitute checking PMP for prescribing purposes.      E.D. Visit Count (12 mo.)  Facility Visits   [facility] 3   [facility] 2   Total 5   Note: Visits indicate total known visits.     Recent Emergency Department Visit Summary  Date Facility [facility] Type Diagnoses or Chief Complaint   Apr 02, 2020 [facility] [place] [facility]      ABD PAIN- emesis      Apr 01, 2020 [facility] [place] [facility] [place] [facility]      Emesis      Nausea      Cyclical vomiting syndrome unrelated to migraine      Mar 31, 2020 [place] [facility] [place] [facility] [place] [facility]      Triage      Abdominal Pain      Emesis      Cyclical vomiting syndrome unrelated to migraine      Feb 05, 2020 [facility] [place] [facility] [place] [facility]      emesis;weakness       Nausea      Nausea with vomiting, unspecified      Generalized abdominal pain      Jan 30, 2020 [place] [facility] [place] [facility] [place] [facility]      triage: vomiting, abdominal pain      Emesis      Nausea with vomiting, unspecified          Recent Inpatient Visit Summary  Date Facility [facility] Type Diagnoses or Chief Complaint   Jan 30, 2020 [place] [facility] [place] [facility]      Nausea with vomiting, unspecified          Care Team  Provider Specialty Phone Fax Service Dates   Elmer Bales, MD Pediatrics [phone] Current      Collective Portal  This patient has registered at the [facility] Emergency Department   For more information visit: [email or web address]     PLEASE NOTE:     1.   Any care recommendations and other clinical information are provided as guidelines or for historical purposes only, and providers should exercise their  own clinical judgment when providing care.    2.   You may only use this information for purposes of treatment, payment or health care operations activities, and subject to the limitations of applicable [employer] Policies.    3.   You should consult directly with the organization that provided a care guideline or other clinical history with any questions about additional information or accuracy or completeness of information provided.    ? 2021 [employer] - www.[employer]medical.com

## 2020-04-02 NOTE — ED Notes (Signed)
[facility] EMERGENCY DEPARTMENT  ED NURSING NOTE FOR THE RECEIVING INPATIENT NURSE   ED NURSE Maudie Flakes [phone]   ED CHARGE RN Cristin   ADMISSION INFORMATION   Bridget [name] is a 24 y.o. female admitted with an ED diagnosis of:    1. Cyclic vomiting syndrome    2. Allergic reaction, initial encounter    3. Cannabinoid hyperemesis syndrome    4. Generalized abdominal pain    5. Nausea         Isolation: None   Allergies: Latex, Compazine [prochlorperazine], and Morphine   Holding Orders confirmed? Yes   Belongings Documented? Yes   Home medications sent to pharmacy confirmed? N/A   NURSING CARE   Patient Comes From:   Mental Status: Home Independent  alert and oriented   ADL: Independent with all ADLs   Ambulation: no difficulty   Pertinent Information  and Safety Concerns: NA     CT / NIH   CT Head ordered on this patient?  N/A   NIH/Dysphagia assessment done prior to admission? N/A   VITAL SIGNS (at the time of this note)      Vitals:    04/02/20 2303   BP: 139/90   Pulse: 83   Resp: 16   Temp: 98.5 F (36.9 C)   SpO2: 98%

## 2020-04-02 NOTE — Telephone Encounter (Signed)
Not sure what directions to give to pt or mother

## 2020-04-02 NOTE — ED Triage Notes (Signed)
unable to tolerate oral intake, c/o abd pain

## 2020-04-03 DIAGNOSIS — Q796 Ehlers-Danlos syndrome, unspecified: Secondary | ICD-10-CM

## 2020-04-03 DIAGNOSIS — Z87440 Personal history of urinary (tract) infections: Secondary | ICD-10-CM

## 2020-04-03 DIAGNOSIS — K30 Functional dyspepsia: Secondary | ICD-10-CM

## 2020-04-03 DIAGNOSIS — I498 Other specified cardiac arrhythmias: Secondary | ICD-10-CM

## 2020-04-03 DIAGNOSIS — R1115 Cyclical vomiting syndrome unrelated to migraine: Secondary | ICD-10-CM

## 2020-04-03 DIAGNOSIS — N926 Irregular menstruation, unspecified: Secondary | ICD-10-CM

## 2020-04-03 LAB — CBC AND DIFFERENTIAL
Absolute NRBC: 0 10*3/uL (ref 0.00–0.00)
Basophils Absolute Automated: 0.04 10*3/uL (ref 0.00–0.08)
Basophils Automated: 0.6 %
Eosinophils Absolute Automated: 0.02 10*3/uL (ref 0.00–0.44)
Eosinophils Automated: 0.3 %
Hematocrit: 34.8 % (ref 34.7–43.7)
Hgb: 12 g/dL (ref 11.4–14.8)
Immature Granulocytes Absolute: 0.01 10*3/uL (ref 0.00–0.07)
Immature Granulocytes: 0.2 %
Lymphocytes Absolute Automated: 2.29 10*3/uL (ref 0.42–3.22)
Lymphocytes Automated: 35.1 %
MCH: 30.8 pg (ref 25.1–33.5)
MCHC: 34.5 g/dL (ref 31.5–35.8)
MCV: 89.2 fL (ref 78.0–96.0)
MPV: 10.4 fL (ref 8.9–12.5)
Monocytes Absolute Automated: 0.6 10*3/uL (ref 0.21–0.85)
Monocytes: 9.2 %
Neutrophils Absolute: 3.57 10*3/uL (ref 1.10–6.33)
Neutrophils: 54.6 %
Nucleated RBC: 0 /100 WBC (ref 0.0–0.0)
Platelets: 227 10*3/uL (ref 142–346)
RBC: 3.9 10*6/uL (ref 3.90–5.10)
RDW: 13 % (ref 11–15)
WBC: 6.53 10*3/uL (ref 3.10–9.50)

## 2020-04-03 LAB — BASIC METABOLIC PANEL
Anion Gap: 8 (ref 5.0–15.0)
BUN: 2 mg/dL — ABNORMAL LOW (ref 7–19)
CO2: 22 mEq/L (ref 22–29)
Calcium: 8.6 mg/dL (ref 8.5–10.5)
Chloride: 105 mEq/L (ref 100–111)
Creatinine: 0.6 mg/dL (ref 0.6–1.0)
Glucose: 102 mg/dL — ABNORMAL HIGH (ref 70–100)
Potassium: 3.1 mEq/L — ABNORMAL LOW (ref 3.5–5.1)
Sodium: 135 mEq/L — ABNORMAL LOW (ref 136–145)

## 2020-04-03 LAB — PHOSPHORUS: Phosphorus: 2.1 mg/dL — ABNORMAL LOW (ref 2.3–4.7)

## 2020-04-03 LAB — MAGNESIUM: Magnesium: 1.4 mg/dL — ABNORMAL LOW (ref 1.6–2.6)

## 2020-04-03 LAB — GFR: EGFR: >60

## 2020-04-03 MED ORDER — POTASSIUM CHLORIDE CRYS ER 20 MEQ PO TBCR
20.0000 meq | EXTENDED_RELEASE_TABLET | Freq: Once | ORAL | Status: AC
Start: 2020-04-03 — End: 2020-04-03
  Administered 2020-04-03: 10:00:00 20 meq via ORAL
  Filled 2020-04-03: qty 1

## 2020-04-03 MED ORDER — FAMOTIDINE 10 MG/ML IV SOLN (WRAP)
20.0000 mg | Freq: Two times a day (BID) | INTRAVENOUS | Status: DC
Start: 2020-04-03 — End: 2020-04-03

## 2020-04-03 MED ORDER — METOCLOPRAMIDE HCL 10 MG PO TABS
10.0000 mg | ORAL_TABLET | Freq: Three times a day (TID) | ORAL | 0 refills | Status: DC | PRN
Start: 2020-04-03 — End: 2020-08-01

## 2020-04-03 MED ORDER — METOCLOPRAMIDE HCL 5 MG/ML IJ SOLN
5.0000 mg | Freq: Four times a day (QID) | INTRAMUSCULAR | Status: DC
Start: 2020-04-04 — End: 2020-04-03

## 2020-04-03 MED ORDER — PROCHLORPERAZINE EDISYLATE 10 MG/2ML IJ SOLN
5.0000 mg | Freq: Once | INTRAMUSCULAR | Status: AC
Start: 2020-04-03 — End: 2020-04-03
  Administered 2020-04-03: 02:00:00 5 mg via INTRAVENOUS
  Filled 2020-04-03: qty 2

## 2020-04-03 MED ORDER — POTASSIUM CHLORIDE 10 MEQ/100ML IV SOLN (WRAP)
10.0000 meq | INTRAVENOUS | Status: AC
Start: 2020-04-03 — End: 2020-04-03
  Administered 2020-04-03 (×2): 10 meq via INTRAVENOUS
  Filled 2020-04-03 (×2): qty 100

## 2020-04-03 MED ORDER — LACTATED RINGERS IV SOLN
INTRAVENOUS | Status: DC
Start: 2020-04-03 — End: 2020-04-03

## 2020-04-03 MED ORDER — ONDANSETRON HCL 4 MG/2ML IJ SOLN
4.0000 mg | Freq: Four times a day (QID) | INTRAMUSCULAR | Status: DC | PRN
Start: 2020-04-03 — End: 2020-04-03
  Administered 2020-04-03: 06:00:00 4 mg via INTRAVENOUS
  Filled 2020-04-03: qty 2

## 2020-04-03 MED ORDER — ENOXAPARIN SODIUM 40 MG/0.4ML SC SOLN
40.0000 mg | Freq: Every day | SUBCUTANEOUS | Status: DC
Start: 2020-04-03 — End: 2020-04-03

## 2020-04-03 MED ORDER — DEXTROSE 50 % IV SOLN
12.5000 g | INTRAVENOUS | Status: DC | PRN
Start: 2020-04-03 — End: 2020-04-03

## 2020-04-03 MED ORDER — NALOXONE HCL 0.4 MG/ML IJ SOLN (WRAP)
0.2000 mg | INTRAMUSCULAR | Status: DC | PRN
Start: 2020-04-03 — End: 2020-04-03

## 2020-04-03 MED ORDER — METOCLOPRAMIDE HCL 5 MG/ML IJ SOLN
10.0000 mg | Freq: Three times a day (TID) | INTRAMUSCULAR | Status: DC
Start: 2020-04-03 — End: 2020-04-03
  Administered 2020-04-03: 09:00:00 10 mg via INTRAVENOUS
  Filled 2020-04-03: qty 2

## 2020-04-03 MED ORDER — MELATONIN 3 MG PO TABS
3.0000 mg | ORAL_TABLET | Freq: Every evening | ORAL | Status: DC | PRN
Start: 2020-04-03 — End: 2020-04-03

## 2020-04-03 MED ORDER — PROCHLORPERAZINE MALEATE 5 MG PO TABS
5.0000 mg | ORAL_TABLET | Freq: Four times a day (QID) | ORAL | 0 refills | Status: DC | PRN
Start: 2020-04-03 — End: 2021-03-13

## 2020-04-03 MED ORDER — GLUCOSE 40 % PO GEL
15.0000 g | ORAL | Status: DC | PRN
Start: 2020-04-03 — End: 2020-04-03

## 2020-04-03 MED ORDER — DIPHENHYDRAMINE HCL 50 MG/ML IJ SOLN
6.2500 mg | Freq: Once | INTRAMUSCULAR | Status: DC
Start: 2020-04-03 — End: 2020-04-03

## 2020-04-03 MED ORDER — ONDANSETRON 4 MG PO TBDP
4.0000 mg | ORAL_TABLET | Freq: Four times a day (QID) | ORAL | 0 refills | Status: AC | PRN
Start: 2020-04-03 — End: ?

## 2020-04-03 MED ORDER — PANTOPRAZOLE SODIUM 40 MG IV SOLR
40.0000 mg | Freq: Every day | INTRAVENOUS | Status: DC
Start: 2020-04-03 — End: 2020-04-03
  Administered 2020-04-03: 09:00:00 40 mg via INTRAVENOUS
  Filled 2020-04-03: qty 40

## 2020-04-03 MED ORDER — DIPHENHYDRAMINE HCL 50 MG/ML IJ SOLN
12.5000 mg | Freq: Four times a day (QID) | INTRAMUSCULAR | Status: DC | PRN
Start: 2020-04-03 — End: 2020-04-03
  Administered 2020-04-03: 02:00:00 12.5 mg via INTRAVENOUS
  Filled 2020-04-03: qty 1

## 2020-04-03 MED ORDER — NORGESTREL-ETHINYL ESTRADIOL 0.3-30 MG-MCG PO TABS
1.0000 | ORAL_TABLET | Freq: Every day | ORAL | Status: DC
Start: 2020-04-03 — End: 2020-04-03
  Filled 2020-04-03: qty 1

## 2020-04-03 MED ORDER — GLUCAGON 1 MG IJ SOLR (WRAP)
1.0000 mg | INTRAMUSCULAR | Status: DC | PRN
Start: 2020-04-03 — End: 2020-04-03

## 2020-04-03 MED ORDER — DEXTROSE-SODIUM CHLORIDE 5-0.9 % IV SOLN
INTRAVENOUS | Status: DC
Start: 2020-04-03 — End: 2020-04-03

## 2020-04-03 MED ORDER — DIPHENHYDRAMINE HCL 50 MG/ML IJ SOLN
12.5000 mg | Freq: Once | INTRAMUSCULAR | Status: AC
Start: 2020-04-03 — End: 2020-04-03
  Administered 2020-04-03: 06:00:00 12.5 mg via INTRAVENOUS
  Filled 2020-04-03: qty 1

## 2020-04-03 NOTE — Telephone Encounter (Signed)
Will need follow up with Dr. Ernst Bowler

## 2020-04-03 NOTE — Telephone Encounter (Signed)
LDVM relaying provider annotations. Also gave contact information

## 2020-04-03 NOTE — Progress Notes (Signed)
Assessment for OBS or low risk patient:    Bridget [name], [place] female with PMHx of cyclic vomiting syndrome, Ehlers-Danlos, POTS, delayed gastric emptying, asthma, chronic UTIs placed in OBS for nausea, generalized abdominal pain    Met with pt at bedside. Provided explanation of OBS. Pt signed OBS letter and was left with a copy.    Introduced [employer] and self to patient and/or family Educational psychologist (name and contact tel #): Y    Discussed plan of care and possible discharge needs with patient/family/care-giver (Y, N) - if family/care-giver, provide name: Y    Patient has cognitive ability to participate in care decisions and follow-up care as needed (Y, N) - provide comments if no: Y    Verified (and corrected if needed) demographics and PCP on facesheet (Y, N): Y    Assessed for lack of insurance or underinsured. (Y, N): Y. Pt has an Financial controller with Rx coverage.  If no insurance or underinsured, patient is high risk and needs high risk assessment     Patient is NOT identified as a Medicare focused patient or high risk for failed discharge plan or readmission as detailed in the High Risk Patient Screening policy (Y, N): Y. Not MFDx or high risk for readmission.    LACE = 8  Plan of care: Stable for [place] home    Discharge Plan A: [place] home with no CM needs  Discharge Plan B:    Expected discharge date: Today    Barriers to discharge: none              a) Barriers needing escalation:              b) Escalated to:       Tentative d/c date and CM # on white board: Y    Comments: Pt just got a new apartment, but she will be staying with her family while she recovers. She is independent and able to drive. She has no concerns that she will need any additional help after discharge.    Bridget [name], MSSW, ACM-SW  Social Worker II Case Manager   [phone])

## 2020-04-03 NOTE — Discharge Instructions (Signed)
Cyclic Vomiting Syndrome    You have been diagnosed with cyclic vomiting syndrome.    Cyclic vomiting syndrome (CVS) is when people suffer from repeated episodes of vomiting or nausea. They have periods of normal health in between episodes. Episodes of nausea and vomiting can last for hours or sometimes days. This condition is most commonly diagnosed in children, but is now being diagnosed in adults more often. People will usually have many tests by a doctor before they are diagnosed with this condition.     It is not known what causes cyclic vomiting syndrome. The diagnosis of CVS has been linked to migraines, food allergies and chronic use of marijuana. In some cases, treatment of these disorders has improved symptoms of CVS. The treatment of CVS is to control the symptoms. Most of the time it can be treated at home with medication for nausea and drinking fluids. Rarely, you will need to be admitted to the hospital to get IV fluids and IV medications    Having this disease can be very hard. People often have problems in everyday life, work or school. People are often fatigued (tired) and very thirsty (dehydration) during episodes of CVS. You might need another exam or more tests to find out why you have these symptoms. At this time, your symptoms do not seem to be caused by anything dangerous. You do not need to stay in the hospital.    Though we don't believe your condition is dangerous right now, it is important to be careful. Sometimes a problem that seems mild can become serious later. This is why it is very important that you return here or go to the nearest Emergency Department if you are not improving or your symptoms are getting worse.    Some things you can try at home to improve symptoms are:   Nausea medications.   Drinking small amounts of fluid often to avoid dehydration.    Avoiding low blood sugar (hypoglycemia).   Rest.   Trying to get rid of stressful things in your life.   Staying  away from any recreational drugs, especially marijuana.   Staying away from narcotic pain medications.    You should come back here or go to the nearest Emergency Department or follow up with your doctor in 2 days.    YOU SHOULD SEEK MEDICAL ATTENTION IMMEDIATELY, EITHER HERE OR AT THE NEAREST EMERGENCY DEPARTMENT, IF ANY OF THE FOLLOWING OCCUR:     You cannot keep fluids down or you have symptoms of dehydration. These can be sunken eyes, fast heart rate, lightheadedness or dizziness.   You do not feel better after treatment.   You have severe abdominal (belly) pain or pain that is getting worse.   You have chest pain or shortness of breath.    If you can t follow up with your doctor, or if at any time you feel you need to be rechecked or seen again, come back here or go to the nearest emergency department.

## 2020-04-03 NOTE — Progress Notes (Signed)
Admission Note-   Patient admitted to Medical#  [id number] at 0050. Hand off report received from ED note.    Patient oriented to room, bed in lowest position, call bell within reach.    Fall prevention contract completed. Yes   Patient informed of visitor policy.   Belongings completed.     Brief Clinical Picture:  Neuro AXO4; Resp -wnl ; No Foley, Central lines- No;  nausea and abdominal tenderness    Skin Assessment  Two nurse skin assessment performed with: Harriett Sine, RN    Areas observed with redness or injury: No    Devices present:none skin integrity under device: N/a    Braden score <15. No    Actions taken: Wound nurse consulted-No    Any significant admission event-

## 2020-04-03 NOTE — ED Provider Notes (Signed)
[facility] History and Physical Exam     Patient Name: Bridget [name], Bridget [name]  Encounter Date:  04/02/2020  Attending Physician: Elray Mcgregor, MD  Patient DOB:  03-Sep-1995  MRN:  [id number]  Room:  J478/G956-21    Chief Complaint     Chief Complaint   Patient presents with   . Abdominal Pain     History of Presenting Illness     24 y.o. female with cyclic vomiting syndrome, frequent marijuana user although she has not used in the last 5 days, presents with abdominal pain and vomiting.  She was seen yesterday for similar symptoms and ended up feeling better after Compazine and morphine and was discharged.  But today she had recurrence of vomiting multiple times.  She tried taking Phenergan rectally and Zofran which did not really help much.  She also tried Protonix.  They have an appointment with Dr. Ernst Bowler the gastroenterologist tomorrow.  Moderate dull constant ache in her abdomen, worse with pushing on it.        PMD:  Reynold Bowen, MD    Nursing Notes Review  Nursing Notes were reviewed.     Previous Records Review  Previous records were reviewed to the extent practicable for the current presentation.          Physical Exam     Vital Signs  BP (!) 148/91   Pulse 82   Temp 98.4 F (36.9 C)   Resp 18   Ht 5\' 4"  (1.626 m)   Wt 45.6 kg   SpO2 98%   BMI 17.26 kg/m     Review of Vital Signs  The patient's vital signs and oxygen saturation were reviewed and interpreted by me, Elray Mcgregor, MD.    Physical Exam    Constitutional: No acute distress but uncomfortable appearing. Coloration not ashen.   Eyes: No conjunctival discharge. EOMI.   Head, Ears, Nose, Mouth, Throat: Normocephalic.  Slightly dry mucous membranes.   Neck: Full range of motion. No obvious neck deformities. No tracheal deviation.   Cardiovascular: No obvious gallops. Normal capillary refill. Strong peripheral pulses.  Respiratory/Chest: No obvious chest wall asymmetry. No resp distress.    Gastrointestinal/Abdominal:  Soft abdomen. No abdominal distention.  Mild diffuse nonlocalizing tenderness.  no rigidity, no guarding. + bowel sounds. No peritoneal signs, benign exam.  Musculoskeletal: Normal ROM. No focal extremity tenderness.   Back: No deformity. No significant scoliosis.   Neurological: Alert. No acute focal deficits.   Skin: Warm skin. No acute rash. Not mottled.           Medical Decision Making     Hx & Exam Synthesis, Differential Diagnosis, Plan     Recurrent cyclic vomiting syndrome: Benign exam overall.  No indication for imaging at the moment.  Will give hydration and antiemetics and pain medication.  Will check labs. If ED course is unremarkable will likely discharge with close follow-up.         ED Course, Monitors, EKG, Critical Care, Splints, Consults, Reevaluation, etc           ED Course as of Apr 03 57   Wed Apr 02, 2020   2010 Feeling a lot better, will try po.   Requesting ivf with glc in it, will order    [CM]   2125 Tolerated po, feels well. Will Bennett    [CM]   2125 I counseled extensively on nature of problem--voiced understanding, agrees to follow up.   Given strict  return precautions--fully understands:   Pt is to return immediately to emergency department if any worsening symptoms at all--otherwise, return to the emergency department tomorrow for a recheck or follow up with gi physician tomorrow.          [CM]   2133 Vomiting again. Will try compazine x1, if not better, will likely admit.     [CM]   2148 Rash developed, mild, ? If from morphine or compazine. Will avoid these meds, will give reglan and zofran.     [CM]   2258 I spoke c ffp, she accepts.     [CM]      ED Course User Index  [CM] Elray Mcgregor, MD     Rash went away after Benadryl was given.        Past Medical History     Past Medical History:   Diagnosis Date   . Abdominal pain    . Asthma     mild seasonal controlled   . Complication of anesthesia    . Post-operative nausea and vomiting    . POTS  (postural orthostatic tachycardia syndrome)     [facility]/stable for many yrs   . Sinus trouble     seasonal   . Yeast infection involving the vagina and surrounding area 05/09/2015       Medications       Current Facility-Administered Medications:   .  Nursing communication: Adult Hypoglycemia Treatment Algorithm, , , Until Discontinued **AND** dextrose (GLUCOSE) 40 % oral gel 15 g of glucose, 15 g of glucose, Oral, PRN **AND** dextrose 50 % bolus 12.5 g, 12.5 g, Intravenous, PRN **AND** glucagon (rDNA) (GLUCAGEN) injection 1 mg, 1 mg, Intramuscular, PRN, Irving Shows, MD  .  diphenhydrAMINE (BENADRYL) injection 6.25 mg, 6.25 mg, Intravenous, Once, Irving Shows, MD  .  enoxaparin (LOVENOX) syringe 40 mg, 40 mg, Subcutaneous, Daily, Irving Shows, MD  .  famotidine (PEPCID) injection 20 mg, 20 mg, Intravenous, Q12H SCH, Irving Shows, MD  .  HYDROmorphone (DILAUDID) injection 1 mg, 1 mg, Intravenous, Q4H PRN, Elray Mcgregor, MD  .  lactated ringers infusion, , Intravenous, Continuous, Irving Shows, MD  .  melatonin tablet 3 mg, 3 mg, Oral, QHS PRN, Irving Shows, MD  .  Melene Muller ON 04/04/2020] metoclopramide (REGLAN) injection 5 mg, 5 mg, Intravenous, 4 times per day, Irving Shows, MD  .  naloxone [facility]) injection 0.2 mg, 0.2 mg, Intravenous, PRN, Irving Shows, MD  .  norgestrel-ethinyl estradiol (CRYSELLE/LO-OVRAL) 0.3-30 MG-MCG per tablet 1 tablet, 1 tablet, Oral, Daily at 1000, Irving Shows, MD  .  ondansetron [facility]) injection 4 mg, 4 mg, Intravenous, Q6H PRN, Irving Shows, MD  .  pantoprazole (PROTONIX) injection 40 mg, 40 mg, Intravenous, Daily, Irving Shows, MD  .  prochlorperazine (COMPAZINE) injection 5 mg, 5 mg, Intravenous, Once, Irving Shows, MD    Allergies     Allergies   Allergen Reactions   . Latex Itching     Itching irritation swelling condoms  Itching irritation swelling condoms  Itching irritation swelling condoms    . Compazine [Prochlorperazine] Rash   . Morphine Rash       Medical history, medications, and allergies reviewed.     Past Surgical History     Past Surgical History:   Procedure Laterality Date   . ADENOIDECTOMY     . EGD N/A 01/23/2018    Procedure: EGD;  Surgeon: Jacqulyn Cane, MD;  Location: [facility];  Service: Gastroenterology;  Laterality: N/A;  EGD  Q1=N/A   . TONSILLECTOMY     . WISDOM TOOTH EXTRACTION         Family History   The family history is not significantly contributory to current presentation.   Family History   Problem Relation Age of Onset   . Cancer Mother    . Ehlers-Danlos syndrome Sister    . Asthma Brother        Social History   Social history is not significantly contributory to the patient's current presentation.   Social History     Occupational History   . Not on file   Tobacco Use   . Smoking status: Never Smoker   . Smokeless tobacco: Never Used   . Tobacco comment: the patient vapes nicotine    Vaping Use   . Vaping Use: Every day   . Start date: 04/14/2018   Substance and Sexual Activity   . Alcohol use: Not Currently   . Drug use: Yes     Comment: Pt. uses marijuana every day. Pt. reports not using it for the past 3 days.   . Sexual activity: Never     Comment: takes the Pill continuously       Review of Systems     See HPI for review of systems that is relevant to the current presentation.   All other systems reviewed: negative.     ED Medications Administered     ED Medication Orders (From admission, onward)    Start Ordered     Status Ordering Provider    04/04/20 0800 04/03/20 0041  metoclopramide (REGLAN) injection 5 mg  4 times per day     Route: Intravenous  Ordered Dose: 5 mg     Acknowledged Irving Shows    04/03/20 1000 04/03/20 0041  norgestrel-ethinyl estradiol (CRYSELLE/LO-OVRAL) 0.3-30 MG-MCG per tablet 1 tablet  Daily at 1000     Route: Oral  Ordered Dose: 1 tablet     Acknowledged Irving Shows    04/03/20 0900 04/03/20 0041  pantoprazole  (PROTONIX) injection 40 mg  Daily     Route: Intravenous  Ordered Dose: 40 mg     Acknowledged Cory Munch M    04/03/20 0900 04/03/20 0041  enoxaparin (LOVENOX) syringe 40 mg  Daily     Route: Subcutaneous  Ordered Dose: 40 mg     Acknowledged Irving Shows    04/03/20 0042 04/03/20 0041  famotidine (PEPCID) injection 20 mg  Every 12 hours scheduled     Route: Intravenous  Ordered Dose: 20 mg     Acknowledged Irving Shows    04/03/20 0042 04/03/20 0041  prochlorperazine (COMPAZINE) injection 5 mg  Once     Route: Intravenous  Ordered Dose: 5 mg     Acknowledged Irving Shows    04/03/20 0042 04/03/20 0041  diphenhydrAMINE (BENADRYL) injection 6.25 mg  Once     Route: Intravenous  Ordered Dose: 6.25 mg     Acknowledged Irving Shows    04/03/20 0042 04/03/20 0041  lactated ringers infusion  Continuous     Route: Intravenous     Acknowledged Irving Shows    04/03/20 0040 04/03/20 0041  dextrose (GLUCOSE) 40 % oral gel 15 g of glucose  As needed     Route: Oral  Ordered Dose: 15 g of glucose     Acknowledged JOSEPH,  LAUREN M    04/03/20 0040 04/03/20 0041  dextrose 50 % bolus 12.5 g  As needed     Route: Intravenous  Ordered Dose: 12.5 g     Acknowledged Cory Munch M    04/03/20 0040 04/03/20 0041  glucagon (rDNA) (GLUCAGEN) injection 1 mg  As needed     Route: Intramuscular  Ordered Dose: 1 mg     Acknowledged Irving Shows    04/03/20 0040 04/03/20 0041  melatonin tablet 3 mg  At bedtime PRN     Route: Oral  Ordered Dose: 3 mg     Acknowledged Irving Shows    04/03/20 0040 04/03/20 0041  naloxone (NARCAN) injection 0.2 mg  As needed     Route: Intravenous  Ordered Dose: 0.2 mg     Acknowledged Cory Munch M    04/03/20 0040 04/03/20 0041  ondansetron (ZOFRAN) injection 4 mg  Every 6 hours PRN     Route: Intravenous  Ordered Dose: 4 mg     Acknowledged Irving Shows    04/02/20 2320 04/02/20 2320    Continuous     Route: Intravenous     Discontinued Jerusalen Mateja N     04/02/20 2319 04/02/20 2320  HYDROmorphone (DILAUDID) injection 1 mg  Every 4 hours PRN     Route: Intravenous  Ordered Dose: 1 mg     Acknowledged Vieno Tarrant N    04/02/20 2319 04/02/20 2320    Every 4 hours PRN     Route: Intravenous  Ordered Dose: 4 mg     Discontinued Gaylyn Berish N    04/02/20 2211 04/02/20 2210  diphenhydrAMINE (BENADRYL) injection 25 mg  Once     Route: Intravenous  Ordered Dose: 25 mg     Last MAR action: Given Rielly Corlett N    04/02/20 2147 04/02/20 2146  ondansetron (ZOFRAN) injection 4 mg  Once     Route: Intravenous  Ordered Dose: 4 mg     Last MAR action: Given Chandra Feger N    04/02/20 2147 04/02/20 2146  metoclopramide (REGLAN) injection 10 mg  Once     Note to Pharmacy: This order is post-timed automatically by Epic--but please give this now.   Route: Intravenous  Ordered Dose: 10 mg     Last MAR action: Given Leilani Cespedes N    04/02/20 2135 04/02/20 2134    Once     Route: Intravenous  Ordered Dose: 10 mg     Discontinued Cherree Conerly N    04/02/20 2012 04/02/20 2011  dextrose  5 % and 0.9 % NaCl infusion  Continuous     Note to Pharmacy: This order is post-timed automatically by Epic, but please give this now. Thanks.   Route: Intravenous     Last MAR action: Stopped Shemica Meath N    04/02/20 1752 04/02/20 1751  sodium chloride 0.9 % bolus 2,000 mL  Once     Route: Intravenous  Ordered Dose: 2,000 mL     Last MAR action: Stopped Harun Brumley N    04/02/20 1752 04/02/20 1751  prochlorperazine (COMPAZINE) injection 10 mg  Once     Route: Intravenous  Ordered Dose: 10 mg     Last MAR action: Given Kately Graffam N    04/02/20 1752 04/02/20 1751  diphenhydrAMINE (BENADRYL) injection 25 mg  Once     Note to Pharmacy: This order is post-timed automatically by Epic--but please give this now.   Route:  Intravenous  Ordered Dose: 25 mg     Last MAR action: Given Vega Stare N    04/02/20 1752 04/02/20 1751  morphine injection 4  mg  Once     Route: Intravenous  Ordered Dose: 4 mg     Last MAR action: Given Valrie Jia N          Orders Placed During This Encounter     Orders Placed This Encounter   Procedures   . CBC and differential   . Comprehensive metabolic panel   . Lipase   . UA Reflex to Micro - Reflex to Culture   . Beta HCG, Qual, Serum   . GFR   . Comprehensive metabolic panel   . CBC and differential   . Magnesium   . Phosphorus   . GFR   . Diet NPO effective now   . Diet:  PO Challenge   . ED Holding Orders Expire in 8 Hours   . Notify Admitting Attending ( Change in Condition)   . Notify Attending of Patient Arrival to Floor within 8 Hours   . Notify Physician (Vital Signs)   . Notify Physician (Lab Results)   . Vital Signs Q4HR   . Bed rest   . Vital signs   . Pulse Oximetry   . Progressive Mobility Protocol   . Notify physician   . NSG Communication: Glucose POCT order (PRN hypoglycemia)   . I/O   . Height   . Weight   . Skin assessment   . Nursing communication: Adult Hypoglycemia Treatment Algorithm   . Place sequential compression device   . Maintain sequential compression device   . Education: Activity   . Education: Disease Process & Condition   . Education: Pain Management   . Education: Falls Risk   . Education: Smoking Cessation   . Incentive spirometry nursing   . Full Code   . ECG 12 lead   . Saline lock IV   . Saline lock IV   . Adult Admit to Observation   . Fall precautions       Diagnostic Study Results     The results of the diagnostic studies below were reviewed by the ED provider:    Labs  Results     Procedure Component Value Units Date/Time    UA Reflex to Micro - Reflex to Culture [[id number]]  (Abnormal) Collected: 04/02/20 1957     Updated: 04/02/20 2017     Urine Type Urine, Clean Ca     Color, UA Yellow     Clarity, UA Clear     Specific Gravity UA 1.025     Urine pH 6.0     Leukocyte Esterase, UA Negative     Nitrite, UA Negative     Protein, UR 100     Glucose, UA Negative     Ketones UA 80      Urobilinogen, UA Normal mg/dL      Bilirubin, UA Negative     Blood, UA Negative     RBC, UA 0 - 2 /hpf      WBC, UA 0 - 5 /hpf      Squamous Epithelial Cells, Urine 0 - 5 /hpf     Comprehensive metabolic panel [[id number]]  (Abnormal) Collected: 04/02/20 1810    Specimen: Blood Updated: 04/02/20 1842     Glucose 70 mg/dL      BUN 6 mg/dL      Creatinine 0.8 mg/dL  Sodium 138 mEq/L      Potassium 4.5 mEq/L      Chloride 106 mEq/L      CO2 12 mEq/L      Calcium 9.7 mg/dL      Protein, Total 7.6 g/dL      Albumin 4.3 g/dL      AST (SGOT) 21 U/L      ALT 14 U/L      Alkaline Phosphatase 54 U/L      Bilirubin, Total 0.7 mg/dL      Globulin 3.3 g/dL      Albumin/Globulin Ratio 1.3     Anion Gap 20.0    Lipase [[id number]] Collected: 04/02/20 1810    Specimen: Blood Updated: 04/02/20 1842     Lipase 17 U/L     GFR [[id number]] Collected: 04/02/20 1810     Updated: 04/02/20 1842     EGFR >60.0    Beta HCG, Qual, Serum [[id number]] Collected: 04/02/20 1810    Specimen: Blood Updated: 04/02/20 1834     Hcg Qualitative Negative    CBC and differential [[id number]]  (Abnormal) Collected: 04/02/20 1810    Specimen: Blood Updated: 04/02/20 1819     WBC 9.46 x10 3/uL      Hgb 14.4 g/dL      Hematocrit 01.0 %      Platelets 308 x10 3/uL      RBC 4.69 x10 6/uL      MCV 91.0 fL      MCH 30.7 pg      MCHC 33.7 g/dL      RDW 13 %      MPV 9.5 fL      Neutrophils 76.3 %      Lymphocytes Automated 17.9 %      Monocytes 4.9 %      Eosinophils Automated 0.0 %      Basophils Automated 0.4 %      Immature Granulocytes 0.5 %      Nucleated RBC 0.0 /100 WBC      Neutrophils Absolute 7.22 x10 3/uL      Lymphocytes Absolute Automated 1.69 x10 3/uL      Monocytes Absolute Automated 0.46 x10 3/uL      Eosinophils Absolute Automated 0.00 x10 3/uL      Basophils Absolute Automated 0.04 x10 3/uL      Immature Granulocytes Absolute 0.05 x10 3/uL      Absolute NRBC 0.00 x10 3/uL           Radiologic Studies  Radiology Results (24 Hour)     ** No  results found for the last 24 hours. Lynford Humphrey and MD Attestations     Rendering Provider: Elray Mcgregor, MD          Diagnosis and Disposition     Clinical Impression  1. Cyclic vomiting syndrome    2. Allergic reaction, initial encounter    3. Cannabinoid hyperemesis syndrome    4. Generalized abdominal pain    5. Nausea        Disposition  ED Disposition     ED Disposition Condition Date/Time Comment    Observation  Wed Apr 02, 2020 10:59 PM Admitting Physician: Barnie Alderman [phone]   Service:: Medicine [106]   Estimated Length of Stay: < 2 midnights   Tentative Discharge Plan?: Home or Self Care [1]   Does patient need telemetry?: No  Prescriptions       Current Discharge Medication List           Elray Mcgregor, MD  04/03/20 [phone]

## 2020-04-03 NOTE — H&P (Signed)
[facility] Resident Note  Office: [phone]      Evaluated patient at admission with Dr. Jomarie Longs, I agree with her A/P as outlined in full H&P note. Pertinent elements of history and exam independently reproduced below.    CC: Nausea and Vomiting    HPI: 24 y.o. female with known delayed gastric emptying, Ehlers-Danlos syndrome, POTS, cyclic vomiting presenting with persistent nausea and vomiting since 03/30/2020.  Patient was recently hospitalized in [facility] from 01/30/2020 to 02/01/2020 for similar presentation and at that time patient was started on an antiemetic regimen consisting of Phenergan and Reglan as well as IV Protonix which helped.  Patient was discharged with these medications and instructed to follow-up with gastro health.  Patient was unable to get an appointment with gastro health until 04/03/2020 at 2 PM.  Patient had been doing okay until 03/30/2020 when she started having significant nausea vomiting.  She states that the last time she smoked marijuana was 03/29/2020.  Since this time, she has been having difficulty with p.o. intake and usually having episodes of emesis shortly after trying to eat or drink.  She states that these episodes sometimes are usually precipitated by viral URI prodrome.  She also states that she has had a burning feeling in her stomach as well as substernal area in the past and this pain usually gets worse immediately after eating food.  She is also been losing weight since June 2021 at which point she weighed 108 pounds and she currently weighs 101 pounds.  Patient does also endorse having headaches but these headaches do not get worse with loud sounds or bright lights.  The headache is located in the frontal region of her head  It is described as a dull ache.  She does have a family history of migraines and that her sister also suffers from them but she has never been formally diagnosed.  Patient otherwise denies fevers, chills, chest pain, shortness  of breath, hematochezia, melena.    Of note, patient has not had colonoscopy but did state that she had a EGD done in 2019 for which she reports was unremarkable except for retained food from a day prior concerning for possible delayed gastric emptying.  Patient first started noticing symptoms back in 2019 and states that she has had a total of 3 episodes since that time of cyclical vomiting.  She states that these episodes usually last 1 to 2 weeks.    PE:   Gen: well-appearing, NAD  HEENT: EOMI, PERRL, MMM, oropharynx clear without erythema or exudate  CV: RRR, no murmurs, rubs, or gallops  Lungs: breathing comfortably on RA, CTAB, no wheezes, rales, or rhonchi  Abdomen: +BS, soft, non-distended, mild epigastric tenderness  Ext: warm, distal pulses intact, no LE edema  Neuro: awake and alert, no focal deficits  Skin: no rashes or lesions noted    A/P: 24 y.o. female with PMH significant for delayed gastric emptying, Ehlers-Danlos syndrome, POTS, cyclic vomiting presenting with persistent nausea and vomiting and during last ER visit was found to have UDS positive for cannabinoids.  Working differential at this time includes cyclic vomiting syndrome versus cannabinoid hyperemesis versus gastritis.  Patient currently HDS  -Start  IV Protonix 40 mg daily.  Can consider up titration to twice daily dosing in the morning  -Give migraine cocktail of Compazine 12.5 mg and 25 mg Benadryl now  As patient reported feeling better after receiving this in the ER before p.o. challenge  - Reglan 10  mg as needed  -S/p 2 L NS bolus in ER  -Start NS at 100 cc/h  -Can consider GI consult in a.m.  -Advised patient to hold off on using marijuana as this may be contributing to symptoms  -Rest of plan per Dr. Jomarie Longs H&P      Cherlyn Cushing, MD  [facility], PGY-3    This note was generated by the [facility] EMR system/Dragon speech recognition and may contain inherent errors or omissions not intended by the user. Grammatical  errors, random word insertions, deletions, pronoun errors and incomplete sentences are occasional consequences of this technology due to software limitations. Not all errors are caught or corrected. If there are questions or concerns about the content of this note or information contained within the body of this dictation they should be addressed directly with the author for clarification.

## 2020-04-03 NOTE — Progress Notes (Incomplete)
Nutrition Assessment    Bridget [name] 24 y.o. female   MRN: [id number]      Reason for Assessment: MST-2 (wt loss, decreased appetite); Low BMI = 17.26 kg/m2    Nutrition Recommendation:   ***      ______________________________________________________________________      Assessment Data:  Summary:   24 y.o. female p/w N/V. PMH significant for cyclic vomiting syndrome, Ehlers-Danlos syndrome, POTS, possible delayed gastric emptying (based on 2019 EGD finding of retained food in stomach), asthma, chronic UTIs, ongoing frequent marijuana use. Admitted to [facility] with similar s/s in June 2021- d/c'ed home on zofran, reglan, and phenergan; follow up appt scheduled but not yet completed with outpatient GI.     ***    Weight Monitoring Weight Weight Method   01/07/2018 45.9 kg Standing Scale   01/13/2018 46.267 kg Stated   01/23/2018 46.267 kg    01/23/2018 46.267 kg    04/14/2018 50.349 kg    10/19/2018 50.803 kg    02/13/2019 50.803 kg    09/17/2019 50.803 kg    01/29/2020 49.17 kg    01/30/2020 47.628 kg Stated   01/30/2020 48.988 kg Stated   02/05/2020 45.1 kg Standing Scale   03/13/2020 48.535 kg    03/31/2020 47.628 kg Estimated   04/01/2020 46.1 kg Standing Scale   04/03/2020 45.6 kg          Adm dx:  Cyclic vomiting syndrome   Patient Active Problem List   Diagnosis   . Postcoital UTI   . Asthma   . Delayed gastric emptying   . Ehlers-Danlos syndrome   . Endometriosis   . Postural orthostatic tachycardia syndrome   . Intractable vomiting with nausea   . ASCUS with positive high risk HPV cervical   . Cyclical vomiting   . Cyclic vomiting syndrome       PMH:  has a past medical history of Abdominal pain, Asthma, Complication of anesthesia, Post-operative nausea and vomiting, POTS (postural orthostatic tachycardia syndrome), Sinus trouble, and Yeast infection involving the vagina and surrounding area (05/09/2015).    Recent Labs   Lab 04/03/20  0656 04/02/20  1810 04/01/20  1652 03/31/20  1104   Sodium 135* 138 137 137   Potassium  3.1* 4.5 3.8 4.0   Chloride 105 106 104 103   CO2 22 12* 18* 19*   BUN 2* 6* 7 10.0   Creatinine 0.6 0.8 0.8 0.8   Glucose 102* 70 98 130*   Calcium 8.6 9.7 9.7 9.5   Magnesium 1.4*  --   --   --    Phosphorus 2.1*  --   --   --    EGFR >60.0 >60.0 >60.0 >60.0   WBC 6.53 9.46 13.26* 11.71*  11.34*   Hematocrit 34.8 42.7 40.7 42.9  42.7   Hgb 12.0 14.4 13.8 14.6  14.0   Lipase  --  17 31 11            Current Facility-Administered Medications   Medication Dose Route Frequency   . enoxaparin  40 mg Subcutaneous Daily   . metoclopramide  10 mg Intravenous Q8H SCH   . norgestrel-ethinyl estradiol  1 tablet Oral Daily at 1000   . pantoprazole  40 mg Intravenous Daily   . potassium chloride  20 mEq Oral Once   . potassium chloride  10 mEq Intravenous Q1H     . dextrose 5 % and 0.9% NaCl 100 mL/hr at 04/03/20 0136  PRN meds given in the past 48 hours: , , Zofran    Social History: Frequent marijuana use.             Intake History:                                                                             Orders Placed This Encounter      Diet sips and chips    Orders Placed This Encounter   Procedures   . Diet sips and chips       Food intake:     Allergies   Allergen Reactions   . Latex Itching     Itching irritation swelling condoms  Itching irritation swelling condoms  Itching irritation swelling condoms   . Compazine [Prochlorperazine] Rash   . Morphine Rash       Nutrition Focused Physical Exam:  Head -  Upper Body - {NFPE UPPER BODY:45743}  Lower Body - {NFPE LOWER BODY:45745}  Edema -  Skin -  GI function -    Learning Needs:     Anthropometrics  Height: 162.6 cm (5\' 4" )  Weight: 45.6 kg (100 lb 8.5 oz)  Weight Change: 0  IBW/kg (Calculated) Female: 59.1 kg  IBW/kg (Calculated) Female: 54.54 kg  BMI (calculated): 17.3                       Total Daily Energy Needs:            Total Daily Protein Needs:            Total Daily Fluid Needs:            Nutrition Diagnosis:   {IHS DIAGNOSIS INTAKE:30423388} related  to *** as evidenced by ***  {IHS DIAGNOSIS CLINICAL:30423389} related to *** as evidenced by ***  {IHS DIAGNOSIS BEHAVIORAL ENVIRONMENTAL:30423390} related to *** as evidenced by ***    Intervention:  Goal:  Plan:    Monitoring/Evaluation:   1. PO intake  2. Weights  3. GI symptoms      Harl Favor, RD

## 2020-04-03 NOTE — Plan of Care (Signed)
Problem: Safety  Goal: Patient will be free from injury during hospitalization  Outcome: Progressing  Flowsheets (Taken 04/03/2020 0319)  Patient will be free from injury during hospitalization:   Assess patient's risk for falls and implement fall prevention plan of care per policy   Use appropriate transfer methods   Ensure appropriate safety devices are available at the bedside   Provide and maintain safe environment   Hourly rounding  Goal: Patient will be free from infection during hospitalization  Outcome: Progressing  Flowsheets (Taken 04/03/2020 0319)  Free from Infection during hospitalization:   Assess and monitor for signs and symptoms of infection   Monitor lab/diagnostic results   Monitor all insertion sites (i.e. indwelling lines, tubes, urinary catheters, and drains)     Problem: Pain  Goal: Pain at adequate level as identified by patient  Outcome: Progressing  Flowsheets (Taken 04/03/2020 0319)  Pain at adequate level as identified by patient:   Identify patient comfort function goal   Assess for risk of opioid induced respiratory depression, including snoring/sleep apnea. Alert healthcare team of risk factors identified.   Assess pain on admission, during daily assessment and/or before any "as needed" intervention(s)   Reassess pain within 30-60 minutes of any procedure/intervention, per Pain Assessment, Intervention, Reassessment (AIR) Cycle   Evaluate if patient comfort function goal is met     Problem: Side Effects from Pain Analgesia  Goal: Patient will experience minimal side effects of analgesic therapy  Outcome: Progressing  Flowsheets (Taken 04/03/2020 0319)  Patient will experience minimal side effects of analgesic therapy:   Monitor/assess patient's respiratory status (RR depth, effort, breath sounds)   Assess for changes in cognitive function   Prevent/manage side effects per LIP orders (i.e. nausea, vomiting, pruritus, constipation, urinary retention, etc.)

## 2020-04-03 NOTE — Final Progress Note (DC Note for stay less than 48 (Signed)
[facility] Final Progress Note  Physician Available 24 hours a day:  Please call number in sticky note to reach hospital team.  Office#: [phone]     Date Time: 04/03/20 10:13 AM  Patient Name: Bridget [name]  Attending Physician: Willia Craze, MD  Primary Care Physician: Reynold Bowen, MD    CC: Cyclic vomiting syndrome    Assessment:   Principal Problem:    Cyclic vomiting syndrome  Resolved Problems:    * No resolved hospital problems. *      24 y.o. female PMHx significant for delayed gastric emptying, Ehlers-Danlos syndrome, POTS, cyclic vomiting  admitted with episode of cyclic vomiting syndrome. Now vomiting has resolved and patient is feeling better. She is hemodynamically stable and safe for discharge.    Plan:   #Intractable vomiting - resolved and feeling better  Patient will continue medications Reglan and Zofran after discharge. She will stop taking Phenergan. We will prescribe her Compazine at home as she says this helped her the most at the hospital. Medications have been discussed with patient and she understands she can take OTC benadryl with the Compazine to prevent shakes/restlessness  - s/p migraine cocktail in ED with compazine, benadryl x2, morphine, zofran, reglan, and IVF   - Reglan 10mg  IV Q8h scheduled   - Protonix 40mg  IV daily  - Zofran 4mg  IV Q6h PRN  - mIVF with D5NS @100cc /hr  - Keep appointment with Dr. Ernst Bowler [facility]) today  - Counseling regarding marijuana cessation, as likely contributing to cyclic vomiting       #Chronic:   Chronic UTIs - on prophylactic Macrobid to prevent post-coital UTI, has not taken this in more than 1 month    Asthma - not on daily inhaler   Postural orthostatic tachycardia syndrome (POTS)   Ehlers-Danlos syndrome    Contraception/irregular menses - continue home OCP  -    #FEN/GI - mIVF with D5NS @100cc /hr / replete lytes / Diet sips and chips     #PPX  - DVT - SCDs, SC lovenox  - GI - protonix      Subjective:     Interval  History/HPI: Patient reports feeling much better currently. Her headache has resolved and she has not had any more episodes of vomiting since the ED. She feels well enough to go home. She can stay with her family and has support at home to help her if she has any problems.    Review of Systems:     Per HPI    Physical Exam:     VITAL SIGNS: Physical Exam:   Temp:  [98.2 F (36.8 C)-99 F (37.2 C)] 98.2 F (36.8 C)  Heart Rate:  [71-87] 71  Resp Rate:  [16-18] 16  BP: (93-148)/(55-93) 95/55      Intake/Output Summary (Last 24 hours) at 04/03/2020 1013  Last data filed at 04/02/2020 2039  Gross per 24 hour   Intake 2266.67 ml   Output -   Net 2266.67 ml    GEN: awake, alert and oriented x 3  HEENT: MMM  CARDS: regular rate and rhythm, no m/r/g  LUNGS: clear to auscultation bilaterally, without wheezing, rhonchi, or rales  ABD: soft, mildly tender in epigastrum, non-distended; no palpable masses,  normoactive BS  EXT: no edema         Current Meds:     Current Facility-Administered Medications   Medication Dose Route Frequency   . enoxaparin  40 mg Subcutaneous Daily   .  metoclopramide  10 mg Intravenous Q8H SCH   . norgestrel-ethinyl estradiol  1 tablet Oral Daily at 1000   . pantoprazole  40 mg Intravenous Daily   . potassium chloride  20 mEq Oral Once   . potassium chloride  10 mEq Intravenous Q1H     Current Facility-Administered Medications   Medication Dose Route Frequency Last Rate   . dextrose 5 % and 0.9% NaCl   Intravenous Continuous 100 mL/hr at 04/03/20 0136     Current Facility-Administered Medications   Medication Dose Route   . dextrose  15 g of glucose Oral    And   . dextrose  12.5 g Intravenous    And   . glucagon (rDNA)  1 mg Intramuscular   . melatonin  3 mg Oral   . naloxone  0.2 mg Intravenous   . ondansetron  4 mg Intravenous         Labs:     Labs (last 24 hours):  Results     Procedure Component Value Units Date/Time    Magnesium [[id number]]  (Abnormal) Collected: 04/03/20 0656     Updated:  04/03/20 0750     Magnesium 1.4 mg/dL     Phosphorus [[id number]]  (Abnormal) Collected: 04/03/20 0656     Updated: 04/03/20 0750     Phosphorus 2.1 mg/dL     Basic Metabolic Panel [[id number]]  (Abnormal) Collected: 04/03/20 0656    Specimen: Blood Updated: 04/03/20 0750     Glucose 102 mg/dL      BUN 2 mg/dL      Creatinine 0.6 mg/dL      Calcium 8.6 mg/dL      Sodium 086 mEq/L      Potassium 3.1 mEq/L      Chloride 105 mEq/L      CO2 22 mEq/L      Anion Gap 8.0    GFR [[id number]] Collected: 04/03/20 0656     Updated: 04/03/20 0750     EGFR >60.0    CBC and differential [[id number]] Collected: 04/03/20 0656    Specimen: Blood Updated: 04/03/20 0748     WBC 6.53 x10 3/uL      Hgb 12.0 g/dL      Hematocrit 01.0 %      Platelets 227 x10 3/uL      RBC 3.90 x10 6/uL      MCV 89.2 fL      MCH 30.8 pg      MCHC 34.5 g/dL      RDW 13 %      MPV 10.4 fL      Neutrophils 54.6 %      Lymphocytes Automated 35.1 %      Monocytes 9.2 %      Eosinophils Automated 0.3 %      Basophils Automated 0.6 %      Immature Granulocytes 0.2 %      Nucleated RBC 0.0 /100 WBC      Neutrophils Absolute 3.57 x10 3/uL      Lymphocytes Absolute Automated 2.29 x10 3/uL      Monocytes Absolute Automated 0.60 x10 3/uL      Eosinophils Absolute Automated 0.02 x10 3/uL      Basophils Absolute Automated 0.04 x10 3/uL      Immature Granulocytes Absolute 0.01 x10 3/uL      Absolute NRBC 0.00 x10 3/uL     UA Reflex to Micro - Reflex to Culture [[id number]]  (Abnormal) Collected: 04/02/20 1957  Updated: 04/02/20 2017     Urine Type Urine, Clean Ca     Color, UA Yellow     Clarity, UA Clear     Specific Gravity UA 1.025     Urine pH 6.0     Leukocyte Esterase, UA Negative     Nitrite, UA Negative     Protein, UR 100     Glucose, UA Negative     Ketones UA 80     Urobilinogen, UA Normal mg/dL      Bilirubin, UA Negative     Blood, UA Negative     RBC, UA 0 - 2 /hpf      WBC, UA 0 - 5 /hpf      Squamous Epithelial Cells, Urine 0 - 5 /hpf     Comprehensive  metabolic panel [[id number]]  (Abnormal) Collected: 04/02/20 1810    Specimen: Blood Updated: 04/02/20 1842     Glucose 70 mg/dL      BUN 6 mg/dL      Creatinine 0.8 mg/dL      Sodium 409 mEq/L      Potassium 4.5 mEq/L      Chloride 106 mEq/L      CO2 12 mEq/L      Calcium 9.7 mg/dL      Protein, Total 7.6 g/dL      Albumin 4.3 g/dL      AST (SGOT) 21 U/L      ALT 14 U/L      Alkaline Phosphatase 54 U/L      Bilirubin, Total 0.7 mg/dL      Globulin 3.3 g/dL      Albumin/Globulin Ratio 1.3     Anion Gap 20.0    Lipase [[id number]] Collected: 04/02/20 1810    Specimen: Blood Updated: 04/02/20 1842     Lipase 17 U/L     GFR [[id number]] Collected: 04/02/20 1810     Updated: 04/02/20 1842     EGFR >60.0    Beta HCG, Qual, Serum [[id number]] Collected: 04/02/20 1810    Specimen: Blood Updated: 04/02/20 1834     Hcg Qualitative Negative    CBC and differential [[id number]]  (Abnormal) Collected: 04/02/20 1810    Specimen: Blood Updated: 04/02/20 1819     WBC 9.46 x10 3/uL      Hgb 14.4 g/dL      Hematocrit 01.0 %      Platelets 308 x10 3/uL      RBC 4.69 x10 6/uL      MCV 91.0 fL      MCH 30.7 pg      MCHC 33.7 g/dL      RDW 13 %      MPV 9.5 fL      Neutrophils 76.3 %      Lymphocytes Automated 17.9 %      Monocytes 4.9 %      Eosinophils Automated 0.0 %      Basophils Automated 0.4 %      Immature Granulocytes 0.5 %      Nucleated RBC 0.0 /100 WBC      Neutrophils Absolute 7.22 x10 3/uL      Lymphocytes Absolute Automated 1.69 x10 3/uL      Monocytes Absolute Automated 0.46 x10 3/uL      Eosinophils Absolute Automated 0.00 x10 3/uL      Basophils Absolute Automated 0.04 x10 3/uL      Immature Granulocytes Absolute 0.05 x10 3/uL      Absolute  NRBC 0.00 x10 3/uL           Imaging (last 24 hours), reviewed and are significant for:  No results found.    Signed by: Carney Bern, MD   Office#: [phone]     UJ:[id number], Orma Render, MD

## 2020-04-03 NOTE — Discharge Instr - AVS First Page (Addendum)
[facility] Inpatient Service  Discharge Instructions for  Bridget [name]    Date Printed: 04/03/20  Primary Doctor's Name: Reynold Bowen, MD, Fax: [phone] Phone: [phone]    Follow up time frame: 3-5 days    Subspecialists: Gastroenterology, Follow up with your GI doctor as planned    Please bring this plan and all your medications to your follow up appointments and any future hospital visits    Diagnosis: Cyclic vomiting syndrome    You were admitted to the hospital for intractable nausea and vomiting caused by your cyclic vomiting syndrome. You were treated with medications to control the nausea and vomiting and to help your headache. You also received fluids to help hydrate you and IV and oral potassium to replete your electrolytes.     Medications:   Please take your medication as instructed in the AVS.  Changes to home medications: Stop taking phenergan  New medications prescribed: Compazine    We are giving you a new medication at home, Compazine, which helped you in the hospital. You should take this medication instead of phenergan. You can also take over-the-counter benadryl, 25mg  or 1 tablet, with the compazine to prevent shaking or feeling of restlessness. Continue taking Zofran and Reglan as needed     Ask your GI doctor today if you should continue with an acid blocker such as Pantoprazole.        [facility]  [phone]  [address], [address]  [place], [place] [address]

## 2020-04-03 NOTE — Progress Notes (Signed)
Patient alert and oriented X4. VSS. Discharge instructions reviewed, questions addressed, patient expressed understanding. Transport to lobby arranged

## 2020-04-04 ENCOUNTER — Encounter (INDEPENDENT_AMBULATORY_CARE_PROVIDER_SITE_OTHER): Payer: Self-pay | Admitting: Family Medicine

## 2020-04-04 ENCOUNTER — Emergency Department
Admission: EM | Admit: 2020-04-04 | Discharge: 2020-04-04 | Disposition: A | Discharge status: Home / Self Care | Payer: No Typology Code available for payment source | Attending: Emergency Medicine | Admitting: Emergency Medicine

## 2020-04-04 ENCOUNTER — Telehealth (INDEPENDENT_AMBULATORY_CARE_PROVIDER_SITE_OTHER): Payer: Self-pay

## 2020-04-04 ENCOUNTER — Telehealth (INDEPENDENT_AMBULATORY_CARE_PROVIDER_SITE_OTHER): Payer: No Typology Code available for payment source | Admitting: Family Medicine

## 2020-04-04 DIAGNOSIS — E872 Acidosis, unspecified: Secondary | ICD-10-CM

## 2020-04-04 DIAGNOSIS — R030 Elevated blood-pressure reading, without diagnosis of hypertension: Secondary | ICD-10-CM

## 2020-04-04 DIAGNOSIS — R112 Nausea with vomiting, unspecified: Secondary | ICD-10-CM

## 2020-04-04 DIAGNOSIS — G90A Postural orthostatic tachycardia syndrome (POTS): Secondary | ICD-10-CM

## 2020-04-04 DIAGNOSIS — Q796 Ehlers-Danlos syndrome, unspecified: Secondary | ICD-10-CM

## 2020-04-04 DIAGNOSIS — I498 Other specified cardiac arrhythmias: Secondary | ICD-10-CM

## 2020-04-04 DIAGNOSIS — G8929 Other chronic pain: Secondary | ICD-10-CM

## 2020-04-04 DIAGNOSIS — R1115 Cyclical vomiting syndrome unrelated to migraine: Secondary | ICD-10-CM

## 2020-04-04 DIAGNOSIS — E878 Other disorders of electrolyte and fluid balance, not elsewhere classified: Secondary | ICD-10-CM

## 2020-04-04 DIAGNOSIS — R1084 Generalized abdominal pain: Secondary | ICD-10-CM

## 2020-04-04 DIAGNOSIS — Z09 Encounter for follow-up examination after completed treatment for conditions other than malignant neoplasm: Secondary | ICD-10-CM

## 2020-04-04 HISTORY — DX: Ehlers-Danlos syndrome, unspecified: Q79.60

## 2020-04-04 HISTORY — DX: Gastroparesis: K31.84

## 2020-04-04 HISTORY — DX: Urinary tract infection, site not specified: N39.0

## 2020-04-04 HISTORY — DX: Cyclical vomiting syndrome unrelated to migraine: R11.15

## 2020-04-04 LAB — COMPREHENSIVE METABOLIC PANEL
ALT: 19 U/L (ref 0–55)
AST (SGOT): 16 U/L (ref 5–34)
Albumin/Globulin Ratio: 1.5 (ref 0.9–2.2)
Albumin: 5 g/dL (ref 3.5–5.0)
Alkaline Phosphatase: 63 U/L (ref 37–106)
Anion Gap: 21 — ABNORMAL HIGH (ref 5.0–15.0)
BUN: 8 mg/dL (ref 7.0–19.0)
Bilirubin, Total: 1 mg/dL (ref 0.2–1.2)
CO2: 18 mEq/L — ABNORMAL LOW (ref 22–29)
Calcium: 10.5 mg/dL (ref 8.5–10.5)
Chloride: 98 mEq/L — ABNORMAL LOW (ref 100–111)
Creatinine: 0.9 mg/dL (ref 0.6–1.0)
Globulin: 3.4 g/dL (ref 2.0–3.6)
Glucose: 76 mg/dL (ref 70–100)
Potassium: 3.5 mEq/L (ref 3.5–5.1)
Protein, Total: 8.4 g/dL — ABNORMAL HIGH (ref 6.0–8.3)
Sodium: 137 mEq/L (ref 136–145)

## 2020-04-04 LAB — CBC AND DIFFERENTIAL
Absolute NRBC: 0 10*3/uL (ref 0.00–0.00)
Basophils Absolute Automated: 0.04 10*3/uL (ref 0.00–0.08)
Basophils Automated: 0.5 %
Eosinophils Absolute Automated: 0.01 10*3/uL (ref 0.00–0.44)
Eosinophils Automated: 0.1 %
Hematocrit: 46.4 % — ABNORMAL HIGH (ref 34.7–43.7)
Hgb: 16.2 g/dL — ABNORMAL HIGH (ref 11.4–14.8)
Immature Granulocytes Absolute: 0.02 10*3/uL (ref 0.00–0.07)
Immature Granulocytes: 0.3 %
Lymphocytes Absolute Automated: 2.21 10*3/uL (ref 0.42–3.22)
Lymphocytes Automated: 28.7 %
MCH: 31 pg (ref 25.1–33.5)
MCHC: 34.9 g/dL (ref 31.5–35.8)
MCV: 88.9 fL (ref 78.0–96.0)
MPV: 10.1 fL (ref 8.9–12.5)
Monocytes Absolute Automated: 0.53 10*3/uL (ref 0.21–0.85)
Monocytes: 6.9 %
Neutrophils Absolute: 4.88 10*3/uL (ref 1.10–6.33)
Neutrophils: 63.5 %
Nucleated RBC: 0 /100 WBC (ref 0.0–0.0)
Platelets: 340 10*3/uL (ref 142–346)
RBC: 5.22 10*6/uL — ABNORMAL HIGH (ref 3.90–5.10)
RDW: 13 % (ref 11–15)
WBC: 7.69 10*3/uL (ref 3.10–9.50)

## 2020-04-04 LAB — GFR: EGFR: >60

## 2020-04-04 LAB — LIPASE: Lipase: 33 U/L (ref 8–78)

## 2020-04-04 LAB — MAGNESIUM: Magnesium: 2 mg/dL (ref 1.6–2.6)

## 2020-04-04 MED ORDER — SODIUM CHLORIDE 0.9 % IV BOLUS
1000.0000 mL | Freq: Once | INTRAVENOUS | Status: AC
Start: 2020-04-04 — End: 2020-04-04
  Administered 2020-04-04: 20:00:00 1000 mL via INTRAVENOUS

## 2020-04-04 MED ORDER — DIPHENHYDRAMINE HCL 50 MG/ML IJ SOLN
25.0000 mg | Freq: Once | INTRAMUSCULAR | Status: AC
Start: 2020-04-04 — End: 2020-04-04
  Administered 2020-04-04: 21:00:00 25 mg via INTRAVENOUS
  Filled 2020-04-04: qty 1

## 2020-04-04 MED ORDER — DICYCLOMINE HCL 10 MG/ML IM SOLN
10.0000 mg | Freq: Once | INTRAMUSCULAR | Status: AC
Start: 2020-04-04 — End: 2020-04-04
  Administered 2020-04-04: 20:00:00 10 mg via INTRAMUSCULAR
  Filled 2020-04-04: qty 2

## 2020-04-04 MED ORDER — SODIUM CHLORIDE 0.9 % IV BOLUS
1000.0000 mL | Freq: Once | INTRAVENOUS | Status: DC
Start: 2020-04-04 — End: 2020-04-04

## 2020-04-04 MED ORDER — METOCLOPRAMIDE HCL 5 MG/ML IJ SOLN
10.0000 mg | Freq: Once | INTRAMUSCULAR | Status: AC
Start: 2020-04-04 — End: 2020-04-04
  Administered 2020-04-04: 18:00:00 10 mg via INTRAVENOUS
  Filled 2020-04-04: qty 2

## 2020-04-04 MED ORDER — HALOPERIDOL LACTATE 5 MG/ML IJ SOLN
5.0000 mg | Freq: Once | INTRAMUSCULAR | Status: AC
Start: 2020-04-04 — End: 2020-04-04
  Administered 2020-04-04: 17:00:00 5 mg via INTRAVENOUS
  Filled 2020-04-04: qty 1

## 2020-04-04 MED ORDER — SODIUM CHLORIDE 0.9 % IV BOLUS
1000.0000 mL | Freq: Once | INTRAVENOUS | Status: AC
Start: 2020-04-04 — End: 2020-04-04
  Administered 2020-04-04: 17:00:00 1000 mL via INTRAVENOUS

## 2020-04-04 NOTE — EDIE (Signed)
COLLECTIVE?NOTIFICATION?04/04/2020 16:24?Bridget [name]?MRN: [id number]    Criteria Met      3 Different Facilities in 90 Days    5 ED Visits in 12 Months    Security and Safety  No recent Security Events currently on file    ED Care Guidelines  There are currently no ED Care Guidelines for this patient. Please check your facility's medical records system.    Flags      Negative COVID-19 Lab Result - [facility] - [name] specimen collected from this patient was negative for COVID-19 / Attributed By: [place] Department of Health / Attributed On: 03/15/2020       Prescription Monitoring Program  000??- Narcotic Use Score  000??- Sedative Use Score  000??- Stimulant Use Score  000??- Overdose Risk Score  - All Scores range from 000-999 with 75% of the population scoring < 200 and on 1% scoring above 650  - The last digit of the narcotic, sedative, and stimulant score indicates the number of active prescriptions of that type  - Higher Use scores correlate with increased prescribers, pharmacies, mg equiv, and overlapping prescriptions  - Higher Overdose Risk Scores correlate with increased risk of unintentional overdose death   Concerning or unexpectedly high scores should prompt [name] review of the PMP record; this does not constitute checking PMP for prescribing purposes.      E.D. Visit Count (12 mo.)  Facility Visits   [place] [facility] 3   [place] [facility]: [facility]) 1   [place] [facility] 2   Total 6   Note: Visits indicate total known visits.     Recent Emergency Department Visit Summary  Date Facility [facility] Type Diagnoses or Chief Complaint   Apr 04, 2020 [place] [facility]: [facility] [place] [place] [facility]      weakness; not able to keep food or liquid down      Apr 02, 2020 [facility] [place] [facility] [place] [place] [facility]      ABD PAIN- emesis      Abdominal Pain      Cyclical vomiting syndrome unrelated to migraine      Apr 01, 2020 [facility] [place] [facility] [place] [facility]      Emesis      Nausea       Cyclical vomiting syndrome unrelated to migraine      Mar 31, 2020 [place] [facility] [place] [facility] [place] [place] [facility]      Triage      Abdominal Pain      Emesis      Cyclical vomiting syndrome unrelated to migraine      Feb 05, 2020 [facility] [place] [facility] [place] [place] [facility]      emesis;weakness      Nausea      Nausea with vomiting, unspecified      Generalized abdominal pain      Jan 30, 2020 [place] [facility] [place] [facility] [place] [place] [facility]      triage: vomiting, abdominal pain      Emesis      Nausea with vomiting, unspecified          Recent Inpatient Visit Summary  Date Facility [facility] Type Diagnoses or Chief Complaint   Jan 30, 2020 [place] [facility] [place] [facility] [place]      Nausea with vomiting, unspecified          Care Team  Provider Specialty Phone Fax Service Dates   Elmer Bales, MD Pediatrics [phone])  [phone] Current      Collective Portal  This patient has registered at the [place] [facility]: [facility]) Emergency Department   For more information visit: [email or web address]     PLEASE NOTE:     1.   Any care recommendations and other clinical information are provided as guidelines or for historical purposes only, and providers should exercise their own clinical judgment when providing care.    2.   You may only use this information for purposes of treatment, payment or health care operations activities, and subject to the limitations of applicable Collective Policies.    3.   You should consult directly with the organization that provided [name] care guideline or other clinical history with any questions about additional information or accuracy or completeness of information provided.    ? 2021 [place], [employer]. - [email or web address]

## 2020-04-04 NOTE — Discharge Instructions (Signed)
Elevated Blood Pressure    During your visit today your blood pressure was higher than normal.    Check your blood pressure several times over the next several days, then follow up with your regular doctor. If you do not have a doctor, ask the medical staff to refer you to one.    You may need medication for your blood pressure if it stays high. Untreated high blood pressure can cause damage to your heart and kidneys and may lead to a heart attack or stroke. It is VERY IMPORTANT to follow up with your doctor.   Check your blood pressure daily and follow up with your doctor.   A doctor will diagnose high blood pressure only if your blood pressure is high for several days. Many pharmacies have machines that let you check your own blood pressure. You can also check with a fire station to see whether a paramedic will take your blood pressure. Another option is to purchase a blood pressure monitor to use at home. These are available at most pharmacies.     YOU SHOULD SEEK MEDICAL ATTENTION IMMEDIATELY, EITHER HERE OR AT THE NEAREST EMERGENCY DEPARTMENT, IF ANY OF THE FOLLOWING OCCURS:   You have a sudden or severe headache.   You are numb, tingly, or weak on one side of your body, half of your face droops, or you have trouble speaking.   You have chest pain.   You are short of breath.             Vomiting    You have been seen for vomiting.    Vomiting (throwing-up) can be caused by many different things. Most of the time the cause IS NOT serious. The doctor feels it is OK for you to go home today.    Common causes of vomiting include the following:   Gastroenteritis (stomach flu), usually with diarrhea.   Other illnesses. Sometimes medical conditions like diabetes, heart problems, headaches, or infections can make someone throw up.    Bowel obstructions (blockages) can cause vomiting and make patients unable to have bowel movements (stool) or pass gas.   Vomiting can be a symptom of appendicitis,  especially if there is also pain in the right lower abdomen (belly).    Sometimes it is hard to find out what is causing the vomiting. Vomiting can be treated with anti-nausea medicines like promethazine (Phenergan), prochlorperazine (Compazine) or ondansetron (Zofran).    Try to drink liquids to avoid dehydration. Don't drink a lot of fluid all at once. Take small sips throughout the day.    YOU SHOULD SEEK MEDICAL ATTENTION IMMEDIATELY, EITHER HERE OR AT THE NEAREST EMERGENCY DEPARTMENT, IF ANY OF THE FOLLOWING OCCURS:   You can't stop vomiting or your vomiting doesn't get better with medication.   You cannot keep liquids down.   You have severe sudden chest or belly pain after vomiting.   You have abdominal pain.             Abdominal Pain    You have been diagnosed with abdominal (belly) pain. The cause of your pain is not yet known.    Many things can cause abdominal pain such as infections and bowel (intestine) spasms. You might need another examination or more tests to find out why you have pain.    At this time, your pain does not seem to be caused by anything dangerous. You do not need surgery. You do not need to stay in the hospital.  Though we don't believe your condition is dangerous right now, it is important to be careful. Sometimes a problem that seems mild now can become serious later. If you do not get completely better or your symptoms get worse, you should seek more care. This is why it is important that you get additional help unless you are 100% improved    Follow up with your regular doctor in:   24 hours.    For the next 24 hours, Drink only clear liquids such as:   Water.   Clear broth.   Sports drinks.   Clear caffeine-free soft drinks, like 7-Up or Sprite.    Return here or go to the nearest Emergency Department immediately if:   Your pain does not go away or gets worse.   You cannot keep fluids down    Your vomit (throw up) is dark green.    You vomit (throw  up) blood or see blood in your stool (poop). Blood might be bright red or dark red. It can also be black and look like tar.   You have a fever (temperature higher than 100.43F / 38C) or shaking chills.   Your skin or eyes look yellow.   Your urine looks brown.   You have severe diarrhea.    If you can't follow up with your doctor, or if at any time you feel you need to be rechecked or seen again, come back here or go to the nearest emergency department.     Rest, fluids.  Tylenol for pain, fever.  Continue other meds as prescribed by your GI doctor  Activity as tolerated.

## 2020-04-04 NOTE — Progress Notes (Signed)
[facility] [place] [facility]                       Date of Virtual Visit: 04/04/2020 1:04 PM        Patient ID: Bridget [name] is a 24 y.o. female.  Attending Physician: Reynold Bowen, MD       Telemedicine Eligibility:      For your treatment today, do you prefer that we bill you directly or submit to insurance?    [x]  Submit claim to insurance  []  Bill patient directly    State Location:    [x]  [place]  []  [place]  []  [place] []  [place]    []  Other (COVID related only - specify location):    Patient Identity Verification:    []  State Issued ID  [x]  DOB / photo ID  []  Other (specify):           Chief Complaint:    Hospital Follow-up (d/c 04/03/20, for Cyclic vomiting syndrome, pt have hx of condition and nausea) and Other (have not started meds: omeprazole and imipramine)               HPI:      Pt presents for hospital discharge follow up. Per pt and mother, at time of hospital discharge, she was able to tolerate a small amount of PO fluids. States that since discharge, nausea and epigastric pain has been persistent. Has only been able to tolerate sips of fluids periodically throughout the day; has not had emesis, but has also not eaten much.  Saw GI outpatient yesterday and prescribed trial of omeprazole and imipramine for prevention of cyclical vomiting syndrome.     Mother wonders if her previous beta blockers (for POTS) was helping to reduce the cyclical vomiting; pt is no longer on this since POTS symptoms were improving        TCM follow-up   Discharge Date: 04/03/20 Admit 04/02/20   Hospital: [facility]   Reason for visit: cyclic vomiting syndrome   Hosp course/test results: admitted for intractable nausea and vomiting. Pt was started on migraine cocktail in ED with Compazine, Benadryl x2, morphine, Zofran, Reglan, and IVF. Reglan, protonix and zofran given during hospitalization with IVF. Vomiting resolved and pt was feeling better at discharge.   How is (s)he  feeling? Any problems? Review medication list and discharge instructions, does patient have any questions or concerns?  Spoke with pt and she states she is not feeling much better. Still having nausea and vomited last night around 10pm. She is taking medications as prescribed. Has not eaten or had many fluids. She was encouraged to drink fluids. Given VV per pt request today since she is not feeling well.   Pending lab results: none   Is follow up appt indicated?  Where and when? Dr Danella Penton 04/03/20   Ask pt to bring medications to follow up appointment            Problem List:    Patient Active Problem List   Diagnosis   . Postcoital UTI   . Asthma   . Delayed gastric emptying   . Ehlers-Danlos syndrome   . Endometriosis   . Postural orthostatic tachycardia syndrome   . ASCUS with positive high risk HPV cervical   . Cyclic vomiting syndrome             Current Meds:    Current Outpatient Medications   Medication Sig  Dispense Refill   . diphenhydrAMINE (BENADRYL) 50 MG tablet Take 50 mg by mouth nightly as needed     . metoclopramide (REGLAN) 10 MG tablet Take 1 tablet (10 mg total) by mouth 3 (three) times daily as needed (nausea/vomiting) 12 tablet 0   . nitrofurantoin, macrocrystal-monohydrate, (MACROBID) 100 MG capsule Take 1 tab by mouth as a single dose taken within 2 hours of sexual intercourse 15 capsule 0   . norethindrone-ethinyl estradiol (ORTHO-NOVUM 1/35) 1-35 MG-MCG per tablet Take 1 tablet by mouth daily 84 tablet 3   . ondansetron (ZOFRAN-ODT) 4 MG disintegrating tablet Take 1 tablet (4 mg total) by mouth every 6 (six) hours as needed for Nausea 12 tablet 0   . prochlorperazine (COMPAZINE) 5 MG tablet Take 1 tablet (5 mg total) by mouth every 6 (six) hours as needed for Nausea 12 tablet 0   . imipramine (TOFRANIL) 10 MG tablet      . omeprazole (PriLOSEC) 40 MG capsule        No current facility-administered medications for this visit.          Allergies:    Allergies   Allergen Reactions   . Latex  Itching     Itching irritation swelling condoms  Itching irritation swelling condoms  Itching irritation swelling condoms   . Morphine Rash             Past Surgical History:    Past Surgical History:   Procedure Laterality Date   . ADENOIDECTOMY     . EGD N/A 01/23/2018    Procedure: EGD;  Surgeon: Jacqulyn Cane, MD;  Location: [facility];  Service: Gastroenterology;  Laterality: N/A;  EGD  Q1=N/A   . TONSILLECTOMY     . WISDOM TOOTH EXTRACTION             Family History:    Family History   Problem Relation Age of Onset   . Cancer Mother    . Ehlers-Danlos syndrome Sister    . Asthma Brother            Social History:    Social History     Tobacco Use   . Smoking status: Never Smoker   . Smokeless tobacco: Never Used   . Tobacco comment: the patient vapes nicotine    Vaping Use   . Vaping Use: Every day   . Start date: 04/14/2018   Substance Use Topics   . Alcohol use: Not Currently   . Drug use: Yes     Comment: Pt. uses marijuana every day. Pt. reports not using it for the past 3 days.          The following sections were reviewed this encounter by the provider:   Tobacco  Allergies  Meds  Problems  Med Hx  Surg Hx  Fam Hx             Vital Signs:    There were no vitals taken for this visit.         ROS:    ROS per HPI; all other systems reviewed and are negative            Physical Exam:    Physical Exam  Constitutional:       General: She is not in acute distress.     Appearance: Normal appearance. She is normal weight. She is not ill-appearing, toxic-appearing or diaphoretic.   HENT:      Head: Normocephalic.   Eyes:  General: No scleral icterus.     Conjunctiva/sclera: Conjunctivae normal.   Pulmonary:      Effort: Pulmonary effort is normal. No respiratory distress.   Neurological:      Mental Status: She is alert.   Psychiatric:         Mood and Affect: Mood normal.         Behavior: Behavior normal.         Thought Content: Thought content normal.         Judgment: Judgment normal.               Assessment:    1. Hospital discharge follow-up    2. Cyclic vomiting syndrome    3. Intractable vomiting with nausea    4. Postural orthostatic tachycardia syndrome    5. Ehlers-Danlos syndrome    Discussed that if pt was not able to tolerate PO fluids despite anti-emetic regimen, to return to ED for IV rehydration.   Trial of imipramine and antacid as prescribed by GI.   Follow up with POTs doctor regarding possibly resuming beta blocker, which can also be used to prevention of cyclical vomiting syndrome.             Follow-up:    Return if symptoms worsen or fail to improve.         Reynold Bowen, MD

## 2020-04-04 NOTE — ED Triage Notes (Signed)
Pt presents to ER c/o periumbilical pain and vomiting since Sunday. Pt was seen in the ER at [facility] on Monday, Tuesday, Wednesday, admitted on Wednesday and discharged yesterday. Pt reports symptoms persist. Saw GI yesterday and PCP today.

## 2020-04-04 NOTE — ED Notes (Signed)
Family at bedside. 

## 2020-04-04 NOTE — Telephone Encounter (Signed)
TCM follow-up     Discharge Date: 04/03/20 Admit 04/02/20     Hospital: [facility]     Reason for visit: cyclic vomiting syndrome     Hosp course/test results: admitted for intractable nausea and vomiting. Pt was started on migraine cocktail in ED with Compazine, Benadryl x2, morphine, Zofran, Reglan, and IVF. Reglan, protonix and zofran given during hospitalization with IVF. Vomiting resolved and pt was feeling better at discharge.     How is (s)he feeling? Any problems? Review medication list and discharge instructions, does patient have any questions or concerns?  Spoke with pt and she states she is not feeling much better. Still having nausea and vomited last night around 10pm. She is taking medications as prescribed. Has not eaten or had many fluids. She was encouraged to drink fluids. Given VV per pt request today since she is not feeling well.     Pending lab results: none     Is follow up appt indicated?  Where and when? Dr Danella Penton 04/03/20     Ask pt to bring medications to follow up appointment

## 2020-04-05 ENCOUNTER — Encounter (INDEPENDENT_AMBULATORY_CARE_PROVIDER_SITE_OTHER): Payer: Self-pay | Admitting: Family Medicine

## 2020-04-05 ENCOUNTER — Telehealth (INDEPENDENT_AMBULATORY_CARE_PROVIDER_SITE_OTHER): Payer: No Typology Code available for payment source | Admitting: Family Medicine

## 2020-04-05 DIAGNOSIS — R1115 Cyclical vomiting syndrome unrelated to migraine: Secondary | ICD-10-CM

## 2020-04-05 DIAGNOSIS — R112 Nausea with vomiting, unspecified: Secondary | ICD-10-CM

## 2020-04-05 NOTE — Progress Notes (Signed)
[facility] [place] [facility]                       Date of Virtual Visit: 04/05/2020 1:22 PM        Patient ID: Bridget [name] is a 24 y.o. female.  Attending Physician: Wynetta Fines, MD       Telemedicine Eligibility:      For your treatment today, do you prefer that we bill you directly or submit to insurance?    []  Bill patient directly     [x]  Submit claim to insurance    State Location:    [x]  [place]  []  [place]  []  [place] []  [place]    []  Other (COVID related only - specify location):    Patient Identity Verification:    [x]  State Issued ID  []  DOB / photo ID  []  Other (specify):           Chief Complaint:    ER Follow-up               HPI:    04/05/2020  The patients mother reports the patient ended up back in the ER on 04/04/2020 for periumbilical abdominal pain 7/10 that was a on/off cramping sensation. The patient was also dry heaving, and vomiting after eating and drinking x 03/30/2020.     The patient also saw a GI doctor 04/03/2020. The GI doctor thought her symptoms were gastroparesis. The mother and patient want to discuss the recent BW that was done at the hospital. The mother also reports the hospital diagnosed the patient with hypochloremia and and metabolic acidosis.    Denied symptoms: diarrhea    HPI from 04/04/20 visit  TCM follow-up     Discharge Date: 04/03/20 Admit 04/02/20     Hospital: [facility]     Reason for visit: cyclic vomiting syndrome     Hosp course/test results: admitted for intractable nausea and vomiting. Pt was started on migraine cocktail in ED with Compazine, Benadryl x2, morphine, Zofran, Reglan, and IVF. Reglan, protonix and zofran given during hospitalization with IVF. Vomiting resolved and pt was feeling better at discharge.     How is (s)he feeling? Any problems? Review medication list and discharge instructions, does patient have any questions or concerns?  Spoke with pt and she states she is not feeling much better.  Still having nausea and vomited last night around 10pm. She is taking medications as prescribed. Has not eaten or had many fluids. She was encouraged to drink fluids. Given VV per pt request today since she is not feeling well.     Pending lab results: none     Is follow up appt indicated?  Where and when? Dr Danella Penton 04/03/20     Ask pt to bring medications to follow up appointment      Nausea is constant  Epigastric abdominal pain   Has been seen by GI; gastroparesis.   Recommended reglan and FD-gard     No emesis     Discharged yesterday afternoon and saw GI after.   1015 today had some fluids, compazine/ benadryl and reglan/ zofran  GI prescribed imipramine and omeprazole     Nausea and vomiting associated if not eating on time and excess activity   Scheduled an appt with POTs doctor. Previously on BB    Patient still with nausea, mild bilious vomiting today.  N/V worse with abd pain.   Some relief of  abd pain with warm compresses and baths.  Using ODT zofran with mild , but inadequate relief.    GI prescribed imipramine for cyclic vomiting, but has not started this yet.  Has not yet started omeprazole. Might not be able to tolerate large capsule.            Problem List:    Patient Active Problem List   Diagnosis   . Postcoital UTI   . Asthma   . Delayed gastric emptying   . Ehlers-Danlos syndrome   . Endometriosis   . Postural orthostatic tachycardia syndrome   . ASCUS with positive high risk HPV cervical   . Cyclic vomiting syndrome             Current Meds:    Current Outpatient Medications   Medication Sig Dispense Refill   . diphenhydrAMINE (BENADRYL) 50 MG tablet Take 50 mg by mouth nightly as needed     . imipramine (TOFRANIL) 10 MG tablet      . metoclopramide (REGLAN) 10 MG tablet Take 1 tablet (10 mg total) by mouth 3 (three) times daily as needed (nausea/vomiting) 12 tablet 0   . nitrofurantoin, macrocrystal-monohydrate, (MACROBID) 100 MG capsule Take 1 tab by mouth as a single dose taken within  2 hours of sexual intercourse 15 capsule 0   . norethindrone-ethinyl estradiol (ORTHO-NOVUM 1/35) 1-35 MG-MCG per tablet Take 1 tablet by mouth daily 84 tablet 3   . omeprazole (PriLOSEC) 40 MG capsule      . ondansetron (ZOFRAN-ODT) 4 MG disintegrating tablet Take 1 tablet (4 mg total) by mouth every 6 (six) hours as needed for Nausea 12 tablet 0   . prochlorperazine (COMPAZINE) 5 MG tablet Take 1 tablet (5 mg total) by mouth every 6 (six) hours as needed for Nausea 12 tablet 0     No current facility-administered medications for this visit.          Allergies:    Allergies   Allergen Reactions   . Haloperidol      Interacts with reglan and also pt had involuntary eye movements.    . Latex Itching     Itching irritation swelling condoms  Itching irritation swelling condoms  Itching irritation swelling condoms   . Morphine Rash             Past Surgical History:    Past Surgical History:   Procedure Laterality Date   . ADENOIDECTOMY     . EGD N/A 01/23/2018    Procedure: EGD;  Surgeon: Jacqulyn Cane, MD;  Location: [facility];  Service: Gastroenterology;  Laterality: N/A;  EGD  Q1=N/A   . TONSILLECTOMY     . WISDOM TOOTH EXTRACTION             Family History:    Family History   Problem Relation Age of Onset   . Cancer Mother    . Ehlers-Danlos syndrome Sister    . Asthma Brother            Social History:    Social History     Tobacco Use   . Smoking status: Never Smoker   . Smokeless tobacco: Never Used   . Tobacco comment: the patient vapes nicotine    Vaping Use   . Vaping Use: Every day   . Start date: 04/14/2018   Substance Use Topics   . Alcohol use: Not Currently   . Drug use: Yes     Comment: Pt. uses marijuana  every day. Pt. reports not using it for the past 3 days.          The following sections were reviewed this encounter by the provider:            Vital Signs:    There were no vitals taken for this visit.         ROS:    Review of Systems   Constitutional: Negative for chills, fatigue and  fever.   Respiratory: Negative for cough and shortness of breath.    Cardiovascular: Negative for chest pain.   Gastrointestinal: Positive for abdominal pain, constipation, nausea and vomiting.             Physical Exam:      GENERAL APPEARANCE: alert, in no acute distress, pleasant, well nourished.   HEAD: normal appearance  EYES: no discharge  EARS: normal hearing  NECK/THYROID: appearance -supple  PSYCH: appropriate affect, appropriate mood, normal speech, normal attention          Assessment:    1. Intractable vomiting with nausea    2. Cyclic vomiting syndrome            Plan:      Trial of OTC omeprazole ODT 40 mg twice daily  Hopefully decreasing acidity will decrease abdominal pain and therefore decrease nausea.    Consider dulcolax suppository for constipation    Follow up with GI specialist as planned        Follow-up:    No follow-ups on file.         Wynetta Fines, MD

## 2020-04-06 ENCOUNTER — Emergency Department
Admission: EM | Admit: 2020-04-06 | Discharge: 2020-04-06 | Disposition: A | Discharge status: Home / Self Care | Payer: No Typology Code available for payment source | Attending: Emergency Medicine | Admitting: Emergency Medicine

## 2020-04-06 DIAGNOSIS — G43A0 Cyclical vomiting, not intractable: Secondary | ICD-10-CM | POA: Insufficient documentation

## 2020-04-06 DIAGNOSIS — E860 Dehydration: Secondary | ICD-10-CM | POA: Insufficient documentation

## 2020-04-06 DIAGNOSIS — R1115 Cyclical vomiting syndrome unrelated to migraine: Secondary | ICD-10-CM

## 2020-04-06 LAB — CBC AND DIFFERENTIAL
Absolute NRBC: 0 10*3/uL (ref 0.00–0.00)
Basophils Absolute Automated: 0.05 10*3/uL (ref 0.00–0.08)
Basophils Automated: 0.6 %
Eosinophils Absolute Automated: 0.05 10*3/uL (ref 0.00–0.44)
Eosinophils Automated: 0.6 %
Hematocrit: 45.1 % — ABNORMAL HIGH (ref 34.7–43.7)
Hgb: 15.6 g/dL — ABNORMAL HIGH (ref 11.4–14.8)
Immature Granulocytes Absolute: 0.04 10*3/uL (ref 0.00–0.07)
Immature Granulocytes: 0.5 %
Lymphocytes Absolute Automated: 2.58 10*3/uL (ref 0.42–3.22)
Lymphocytes Automated: 31.9 %
MCH: 30.6 pg (ref 25.1–33.5)
MCHC: 34.6 g/dL (ref 31.5–35.8)
MCV: 88.4 fL (ref 78.0–96.0)
MPV: 9.4 fL (ref 8.9–12.5)
Monocytes Absolute Automated: 0.59 10*3/uL (ref 0.21–0.85)
Monocytes: 7.3 %
Neutrophils Absolute: 4.78 10*3/uL (ref 1.10–6.33)
Neutrophils: 59.1 %
Nucleated RBC: 0 /100 WBC (ref 0.0–0.0)
Platelets: 364 10*3/uL — ABNORMAL HIGH (ref 142–346)
RBC: 5.1 10*6/uL (ref 3.90–5.10)
RDW: 13 % (ref 11–15)
WBC: 8.09 10*3/uL (ref 3.10–9.50)

## 2020-04-06 LAB — GFR: EGFR: >60

## 2020-04-06 LAB — COMPREHENSIVE METABOLIC PANEL
ALT: 16 U/L (ref 0–55)
AST (SGOT): 17 U/L (ref 5–34)
Albumin/Globulin Ratio: 1.6 (ref 0.9–2.2)
Albumin: 4.5 g/dL (ref 3.5–5.0)
Alkaline Phosphatase: 52 U/L (ref 37–106)
Anion Gap: 20 — ABNORMAL HIGH (ref 5.0–15.0)
BUN: 8 mg/dL (ref 7–19)
Bilirubin, Total: 1.3 mg/dL — ABNORMAL HIGH (ref 0.2–1.2)
CO2: 18 mEq/L — ABNORMAL LOW (ref 22–29)
Calcium: 10.3 mg/dL (ref 8.5–10.5)
Chloride: 98 mEq/L — ABNORMAL LOW (ref 100–111)
Creatinine: 0.8 mg/dL (ref 0.6–1.0)
Globulin: 2.8 g/dL (ref 2.0–3.6)
Glucose: 80 mg/dL (ref 70–100)
Potassium: 3.9 mEq/L (ref 3.5–5.1)
Protein, Total: 7.3 g/dL (ref 6.0–8.3)
Sodium: 136 mEq/L (ref 136–145)

## 2020-04-06 LAB — LIPASE: Lipase: 37 U/L (ref 8–78)

## 2020-04-06 LAB — HCG, SERUM, QUALITATIVE: Hcg Qualitative: NEGATIVE

## 2020-04-06 MED ORDER — SODIUM CHLORIDE 0.9 % IV BOLUS
1000.0000 mL | Freq: Once | INTRAVENOUS | Status: AC
Start: 2020-04-06 — End: 2020-04-06
  Administered 2020-04-06: 14:00:00 1000 mL via INTRAVENOUS

## 2020-04-06 MED ORDER — METOCLOPRAMIDE HCL 5 MG/ML IJ SOLN
10.0000 mg | Freq: Once | INTRAMUSCULAR | Status: AC
Start: 2020-04-06 — End: 2020-04-06
  Administered 2020-04-06: 12:00:00 10 mg via INTRAVENOUS
  Filled 2020-04-06: qty 2

## 2020-04-06 MED ORDER — SODIUM CHLORIDE 0.9 % IV BOLUS
500.0000 mL | Freq: Once | INTRAVENOUS | Status: AC
Start: 2020-04-06 — End: 2020-04-06
  Administered 2020-04-06: 14:00:00 500 mL via INTRAVENOUS

## 2020-04-06 MED ORDER — SODIUM CHLORIDE 0.9 % IV BOLUS
1000.0000 mL | Freq: Once | INTRAVENOUS | Status: AC
Start: 2020-04-06 — End: 2020-04-06
  Administered 2020-04-06: 12:00:00 1000 mL via INTRAVENOUS

## 2020-04-06 NOTE — ED Provider Notes (Signed)
[facility]       [facility] EMERGENCY DEPARTMENT PATIENT ENCOUNTER  Patient Information   Patient Name: Bridget [name], Bridget [name]  Encounter Date:  04/06/2020  Patient DOB:  1996-06-18  MRN:  [id number]  Room:  12/A12  Rendering Provider: Suzi Roots. Daliyah Sramek, PA-C  History of Presenting Illness   Chief Complaint: vomiting    HPI Comments:   24 y.o. female with hx of Ehlers-Danlos, cyclical vomiting, pots, chronic marijuana usage who presents the ED for evaluation of nausea and mother being concerned about her ability to keep down p.o. intake.  The patient has been seen multiple times in the ED over the past 1 week for similar symptoms.  Mom says that they recently followed up with gastroenterology as an outpatient 2 days ago.  She says that she believes that the patient has gastroparesis and feels that marijuana usage is only small contributing factor to her symptoms.  The patient and mom say that they have a hard time when she comes to the ER and her symptoms are all chalked up to marijuana usage.  She says that when she started using marijuana 2 years ago she was able to gain all of her weight back and get back to a normal weight.  Felt fine during the pandemic then a couple of months ago started using marijuana again and symptoms restarted.  The patient is followed by a POTS doctor at [facility] and they are trying to see a gastroenterologist there as well for gastroparesis.  The patient was seen at [facility] ER 2 days ago where she received haloperidol and Reglan together.  At that time the patient had a reaction where mom said that her eyes were stuck in an upward position looking at the ceiling and she could not correct it.  They tried p.o. Reglan at home which usually helps control her nausea but same symptoms happened at home.  Mom is concerned because she had a cup of broth last night and minimal p.o. intake this morning and she is concerned that she is again getting dehydrated.  She  has not had any vomiting since leaving the hospital on Friday, 2 days ago.  She had a small bowel movement this morning. She continues to have her diffuse crampy abdominal pain which is unchanged from previous evaluations.     PMD: Reynold Bowen, MD  Past Medical History     Past Medical History:   Diagnosis Date   . Abdominal pain    . Asthma     mild seasonal controlled   . Complication of anesthesia    . Cyclical vomiting    . Ehlers-Danlos disease    . Gastroparesis    . Post-operative nausea and vomiting    . POTS (postural orthostatic tachycardia syndrome)     [facility]/stable for many yrs   . Sinus trouble     seasonal   . UTI (urinary tract infection)    . Yeast infection involving the vagina and surrounding area 05/09/2015     Home Medications   No current facility-administered medications for this encounter.    Current Outpatient Medications:   .  diphenhydrAMINE (BENADRYL) 50 MG tablet, Take 50 mg by mouth nightly as needed, Disp: , Rfl:   .  imipramine (TOFRANIL) 10 MG tablet, , Disp: , Rfl:   .  metoclopramide (REGLAN) 10 MG tablet, Take 1 tablet (10 mg total) by mouth 3 (three) times daily as needed (nausea/vomiting),  Disp: 12 tablet, Rfl: 0  .  nitrofurantoin, macrocrystal-monohydrate, (MACROBID) 100 MG capsule, Take 1 tab by mouth as a single dose taken within 2 hours of sexual intercourse, Disp: 15 capsule, Rfl: 0  .  norethindrone-ethinyl estradiol (ORTHO-NOVUM 1/35) 1-35 MG-MCG per tablet, Take 1 tablet by mouth daily, Disp: 84 tablet, Rfl: 3  .  omeprazole (PriLOSEC) 40 MG capsule, , Disp: , Rfl:   .  ondansetron (ZOFRAN-ODT) 4 MG disintegrating tablet, Take 1 tablet (4 mg total) by mouth every 6 (six) hours as needed for Nausea, Disp: 12 tablet, Rfl: 0  .  prochlorperazine (COMPAZINE) 5 MG tablet, Take 1 tablet (5 mg total) by mouth every 6 (six) hours as needed for Nausea, Disp: 12 tablet, Rfl: 0  Past Surgical History     Past Surgical History:   Procedure  Laterality Date   . ADENOIDECTOMY     . EGD N/A 01/23/2018    Procedure: EGD;  Surgeon: Jacqulyn Cane, MD;  Location: [facility];  Service: Gastroenterology;  Laterality: N/A;  EGD  Q1=N/A   . TONSILLECTOMY     . WISDOM TOOTH EXTRACTION       Family History     Family History   Problem Relation Age of Onset   . Cancer Mother    . Ehlers-Danlos syndrome Sister    . Asthma Brother      Social History   Patient is a non-smoker  Review of Systems   Constitutional: Negative for fever or chills.   HEENT: Negative for sore throat, runny nose  Cardiovascular: Negative for chest pain  Respiratory: Negative for cough and shortness of breath.   Gastrointestinal:Positive for abd. pain, nausea, vomiting, negative for diarrhea.  Genitourinary: Negative for dysuria, hematuria.   Musculoskeletal: Negative for back pain   Skin: Negative for rash.   Neuro: Negative for dizziness, headache   Physical Exam   PHYSICAL EXAM  Patient Vitals for the past 24 hrs:   BP Temp Temp src Pulse Resp SpO2   04/06/20 1300 127/86 - - 81 - 100 %   04/06/20 1142 115/77 - - 96 - 99 %   04/06/20 1024 121/82 97.3 F (36.3 C) Oral 80 18 100 %     Constitutional: Well-developed and well-nourished. No distress. Thin  HENT:     Head: Normocephalic and atraumatic.     Right/Left Ear: External ears normal.     Mouth/Throat: Moist mucous membranes. Uvula is midline. Oropharynx is clear.  Eyes: Conjunctivae normal. Pupils are equal, round, and reactive to light.   Neck: Normal range of motion. Supple.  Cardiovascular: Regular rate and rhythm. No murmur.    Pulmonary/Chest: Vesicular breath sounds throughout. No respiratory distress. O2 sat 100% on RA.   Abdominal: Bowel sounds are present throughout.  Abdomen is soft but diffusely tender.  No guarding or rebound tenderness.  Neurological: Pt is alert and oriented to person, place, and time. GCS 15. Normal speech.  Skin: Skin is warm and dry. Visualized skin without rash.  Psychiatric: Calm and  cooperative with normal behavior  Orders Placed During This Encounter     Orders Placed This Encounter   Procedures   . CBC and differential   . Comprehensive metabolic panel   . Beta HCG, Qual, Serum   . Lipase   . GFR     ED Medications Administered     ED Medication Orders (From admission, onward)    Start Ordered     Status Ordering Provider  04/06/20 1213 04/06/20 1212  sodium chloride 0.9 % bolus 500 mL  Once     Route: Intravenous  Ordered Dose: 500 mL     Last MAR action: Stopped Myliah Medel C    04/06/20 1104 04/06/20 1103  sodium chloride 0.9 % bolus 1,000 mL  Once     Route: Intravenous  Ordered Dose: 1,000 mL     Last MAR action: Stopped Leevi Cullars C    04/06/20 1104 04/06/20 1103  metoclopramide (REGLAN) injection 10 mg  Once     Route: Intravenous  Ordered Dose: 10 mg     Last MAR action: Given Adaliz Dobis C    04/06/20 1104 04/06/20 1103  sodium chloride 0.9 % bolus 1,000 mL  Once     Route: Intravenous  Ordered Dose: 1,000 mL     Last MAR action: Stopped Kourtnei Rauber C        Diagnostic Study Results and Data Review   The results of the diagnostic studies below were reviewed by the ED provider:  Labs  Results     Procedure Component Value Units Date/Time    Comprehensive metabolic panel [[id number]]  (Abnormal) Collected: 04/06/20 1134    Specimen: Blood Updated: 04/06/20 1207     Glucose 80 mg/dL      BUN 8 mg/dL      Creatinine 0.8 mg/dL      Sodium 086 mEq/L      Potassium 3.9 mEq/L      Chloride 98 mEq/L      CO2 18 mEq/L      Calcium 10.3 mg/dL      Protein, Total 7.3 g/dL      Albumin 4.5 g/dL      AST (SGOT) 17 U/L      ALT 16 U/L      Alkaline Phosphatase 52 U/L      Bilirubin, Total 1.3 mg/dL      Globulin 2.8 g/dL      Albumin/Globulin Ratio 1.6     Anion Gap 20.0    Lipase [[id number]] Collected: 04/06/20 1134    Specimen: Blood Updated: 04/06/20 1207     Lipase 37 U/L     GFR [[id number]] Collected: 04/06/20 1134     Updated: 04/06/20 1207     EGFR >60.0    Beta HCG, Qual, Serum  [[id number]] Collected: 04/06/20 1134    Specimen: Blood Updated: 04/06/20 1204     Hcg Qualitative Negative    CBC and differential [[id number]]  (Abnormal) Collected: 04/06/20 1134    Specimen: Blood Updated: 04/06/20 1150     WBC 8.09 x10 3/uL      Hgb 15.6 g/dL      Hematocrit 59.5 %      Platelets 364 x10 3/uL      RBC 5.10 x10 6/uL      MCV 88.4 fL      MCH 30.6 pg      MCHC 34.6 g/dL      RDW 13 %      MPV 9.4 fL      Neutrophils 59.1 %      Lymphocytes Automated 31.9 %      Monocytes 7.3 %      Eosinophils Automated 0.6 %      Basophils Automated 0.6 %      Immature Granulocytes 0.5 %      Nucleated RBC 0.0 /100 WBC      Neutrophils Absolute 4.78 x10 3/uL  Lymphocytes Absolute Automated 2.58 x10 3/uL      Monocytes Absolute Automated 0.59 x10 3/uL      Eosinophils Absolute Automated 0.05 x10 3/uL      Basophils Absolute Automated 0.05 x10 3/uL      Immature Granulocytes Absolute 0.04 x10 3/uL      Absolute NRBC 0.00 x10 3/uL           Radiologic Studies  Radiology Results (24 Hour)     ** No results found for the last 24 hours. **        Abnormal results/incidental findings discussed with pt and/or family: Yes  Monitors, EKG, Procedures, Critical Care, and Splints   None  MDM, Consults and Clinical Notes   The following differentials were considered in part and not exclusively: Dehydration, electrolyte abnormality, malnutrition, functional abdominal pain    Nursing records reviewed: Yes    Clinical Notes/MDM: 24 y.o. female to the ED for evaluation of abdominal pain, nausea and vomiting.  He has been evaluated for this multiple times in the past 1 week.  Patient says that she has not used marijuana in the past 1 week because she has been told multiple times that it could be contributing to her symptoms.  Mom is concerned because the patient continues not being able to gain weight and tolerate significant amount of p.o. fluids because of poor appetite.  She also had a poor reaction to Reglan at hospital 2  days ago when it was given in conjunction with haloperidol.  Mom has been concerned about giving her p.o. Reglan at home out of concern of repeat reaction.  Would like to give IV Reglan here today and monitor and see how she does.  Mom states that Reglan works the best at controlling her symptoms of nausea and they are hopeful that she will still be able to use it.      I had a very lengthy 30-minute discussion at the bedside with patient and mother regarding importance of abstaining from marijuana to continue her GI evaluation for baseline gastroparesis and advised that marijuana usage is likely convoluting her clinical picture and making it more difficult to sort out as this also can lead to nausea and vomiting.  They expressed understanding with this.  Patient is also frustrated by this though because her previous marijuana usage led to her most successful period of weight gain and appetite improvement.      PPE option 1 was utilized.  PPE options:  1. surgical mask, gloves  2. N95 mask, surgical mask, face shield/goggles, gown, gloves  3. 53M [id number] Respirator, surgical cap, face shield/goggles, gown, gloves     Re-Eval at the following times:  ED Course as of Apr 06 2133   Sun Apr 06, 2020   1235  patient's labs consistent with her evaluation at outside hospital 2 days ago.  She again has metabolic acidosis and hemoconcentration.  She was given IV fluids and was able to tolerate p.o.  Mom is very happy that she was able to tolerate IV Reglan without reaction.  She was advised to continue p.o. Reglan at home and continue with their outpatient evaluation.   [AB]      ED Course User Index  [AB] Jeanne Ivan, PA     The patient is aware that this evaluation is only a screening for emergent conditions related to his or her symptoms and presentation. Discussed appropriate supportive care in addition to any prescriptions provided. Patient/Family were given opportunity  to ask questions which were answered to the best  of my ability. Patient/family were given strict return precautions and were agreeable to dispo plan.      COVID Notes  The patient was seen and evaluated during the time of COVID pandemic.  Significant limitations can be present in the evaluation and management of ED patients during pandemic conditions, including but not limited to lack of testing, rapidly changing IHS protocols, limited evaluation and follow-up resources.     Prescriptions     Discharge Medication List as of 04/06/2020  2:18 PM        Diagnosis and Disposition   Clinical Impression:  1. Dehydration    2. Cyclical vomiting syndrome not associated with migraine      Final diagnoses:   Dehydration   Cyclical vomiting syndrome not associated with migraine       Disposition:  ED Disposition     ED Disposition Condition Date/Time Comment    Discharge  Sun Apr 06, 2020  2:18 PM Laymond Purser discharge to home/self care.    Condition at disposition: Stable        Rendering Provider: Suzi Roots. May Ozment, PA-C  Attending's signature signifies review of the provider note and clinical impression.  This note was generated by the [facility] EMR system/Dragon speech recognition and may contain inherent errors or omissions not intended by the user. Grammatical errors, random word insertions, deletions, pronoun errors and incomplete sentences are occasional consequences of this technology due to software limitations. Not all errors are caught or corrected. If there are questions or concerns about the content of this note or information contained within the body of this dictation they should be addressed directly with the author for clarification.       Jeanne Ivan, [place]  04/06/20 2136       Wilmer Floor, DO  04/15/20 1032

## 2020-04-06 NOTE — EDIE (Signed)
COLLECTIVE?NOTIFICATION?04/06/2020 10:21?Bridget, FORINASH [name]?MRN: [id number]    Criteria Met      5 ED Visits in 12 Months    3 Different Facilities in 90 Days    Security and Safety  No recent Security Events currently on file    ED Care Guidelines  There are currently no ED Care Guidelines for this patient. Please check your facility's medical records system.    Flags      Negative COVID-19 Lab Result - [facility] - [name] specimen collected from this patient was negative for COVID-19 / Attributed By: [place] [facility] / Attributed On: 03/15/2020       Prescription Monitoring Program  000??- Narcotic Use Score  000??- Sedative Use Score  000??- Stimulant Use Score  000??- Overdose Risk Score  - All Scores range from 000-999 with 75% of the population scoring < 200 and on 1% scoring above 650  - The last digit of the narcotic, sedative, and stimulant score indicates the number of active prescriptions of that type  - Higher Use scores correlate with increased prescribers, pharmacies, mg equiv, and overlapping prescriptions  - Higher Overdose Risk Scores correlate with increased risk of unintentional overdose death   Concerning or unexpectedly high scores should prompt [name] review of the PMP record; this does not constitute checking PMP for prescribing purposes.      E.D. Visit Count (12 mo.)  Facility Visits   [facility] 4   [facility] 1   [facility] 2   Total 7   Note: Visits indicate total known visits.     Recent Emergency Department Visit Summary  Date Facility [facility] Type Diagnoses or Chief Complaint   Apr 06, 2020 [facility] [place] [facility]      N/V;abd pain      Apr 04, 2020 [facility] [place] [place] [facility]      weakness; not able to keep food or liquid down      Emesis      Abdominal Pain      Acidosis      Other chronic pain      Elevated blood-pressure reading, without diagnosis of hypertension      Cyclical  vomiting syndrome unrelated to migraine      Generalized abdominal pain      Nausea with vomiting, unspecified      Other disorders of electrolyte and fluid balance, not elsewhere classified      Apr 02, 2020 [facility] [place] [facility]      ABD PAIN- emesis      Abdominal Pain      Cyclical vomiting syndrome unrelated to migraine      Apr 01, 2020 [facility] [place] [facility]      Emesis      Nausea      Cyclical vomiting syndrome unrelated to migraine      Mar 31, 2020 [facility] [place] [facility]      Triage      Abdominal Pain      Emesis      Cyclical vomiting syndrome unrelated to migraine      Feb 05, 2020 [facility] [facility] [place] [facility]      emesis;weakness      Nausea      Nausea with vomiting, unspecified      Generalized abdominal pain  Jan 30, 2020 [facility] [place] [facility]      triage: vomiting, abdominal pain      Emesis      Nausea with vomiting, unspecified          Recent Inpatient Visit Summary  Date Facility [facility] Type Diagnoses or Chief Complaint   Jan 30, 2020 [facility]      Nausea with vomiting, unspecified          Care Team  Provider Specialty Phone Fax Service Dates   Elmer Bales, MD Pediatrics [phone] Current      Collective Portal  This patient has registered at the [facility] Emergency Department   For more information visit: [email or web address]     PLEASE NOTE:     1.   Any care recommendations and other clinical information are provided as guidelines or for historical purposes only, and providers should exercise their own clinical judgment when providing care.    2.   You may only use this information for purposes of treatment, payment or health care operations activities, and subject to the limitations of applicable Collective Policies.    3.   You should consult directly with the  organization that provided [name] care guideline or other clinical history with any questions about additional information or accuracy or completeness of information provided.    ? 2021 [place], [employer] - [email or web address]

## 2020-04-06 NOTE — ED Triage Notes (Signed)
Discharged Thursday from [facility], here with po fluid intake, weakness. Haloperidol/reglan given Friday and mom reports patient had a reaction

## 2020-04-06 NOTE — Discharge Instructions (Signed)
Dehydration    You have been diagnosed with dehydration.    Dehydration is when your body is low on fluids (liquids). Dehydration has a variety of causes. These range from vomiting and diarrhea, to excessive (a lot of) sweating and poor appetite.    You have received intravenous (IV) fluids. These are to help fix your dehydration. It is important to keep hydrating yourself at home. Drink plenty of fluids frequently. Drink fluids that won't upset your stomach. This includes water and juice. It also includes drinks like Gatorade. Stay away from beverages like soda pop and tea. They may make you worse. Avoid caffeine and alcohol. They may cause you to lose more fluid.    YOU SHOULD SEEK MEDICAL ATTENTION IMMEDIATELY, EITHER HERE OR AT THE NEAREST EMERGENCY DEPARTMENT, IF ANY OF THE FOLLOWING OCCURS:   Fever (temperature higher than 100.22F / 38C) or shaking chills.   Constant vomiting and/or diarrhea.   Lightheadedness or fainting.   If you do not urinate (pee) for 8 or more hours.             Dear Bridget [name],    You were seen today by Dorian Pod, PA-C. Thank you for choosing the [facility] for your healthcare needs.  We hope your visit today was EXCELLENT.    Return to the emergency room immediately if you have any worsening symptoms!   Please take any medications prescribed as directed.     If you have any questions or concerns, I am available at [phone]. Please do not hesitate to contact me if I can be of assistance.     Below is some information and resources that our patients often find helpful.    Sincerely,  Dorian Pod, Physician Assistant  [facility]    ________________________________________________________________  Thank you for choosing [facility] for your emergency care needs.  We strive to provide EXCELLENT care to you and your family.      IF YOU DO NOT CONTINUE TO IMPROVE OR YOUR CONDITION WORSENS,  PLEASE CONTACT YOUR DOCTOR OR RETURN IMMEDIATELY TO THE EMERGENCY DEPARTMENT.    DOCTOR REFERRALS  Call [phone] (available 24 hours a day, 7 days a week) if you need any further referrals and we can help you find a primary care doctor or specialist.  Also, available online at:  [email or web address]    YOUR CONTACT INFORMATION  Before leaving please check with registration to make sure we have an up-to-date contact number.  You can call registration at [phone] to update your information.  For questions about your hospital bill, please call [phone].  For questions about your Emergency Dept Physician bill please call [phone].      FREE HEALTH SERVICES  If you need help with health or social services, please call 2-1-1 for a free referral to resources in your area.  2-1-1 is a free service connecting people with information on health insurance, free clinics, pregnancy, mental health, dental care, food assistance, housing, and substance abuse counseling.  Also, available online at:  [email or web address]      PATIENT RELATIONS  If you have any concerns, issues or feedback with your care, positive or negative, please do not hesitate to contact Patient Relations at [phone]. They are open from 8:30AM-5:00PM Monday through Friday.      MEDICAL RECORDS AND TESTS  Certain laboratory test results do not come back the same day, for example urine cultures.  We will contact you if other important findings are noted. Radiology films are often reviewed again to ensure accuracy.  If there is any discrepancy, we will notify you.    Please call [phone] to pick up a complimentary CD of any radiology studies performed.  If you or your doctor would like to request a copy of your medical records, please call [phone].      ORTHOPEDIC INJURY   Please know that significant injuries can exist even when an initial x-ray is read as normal or negative.  This can occur because  some fractures (broken bones) are not initially visible on x-rays.  For this reason, close outpatient follow-up with your primary care doctor or bone specialist (orthopedist) is required.    MEDICATIONS AND FOLLOWUP  Please be aware that some prescription medications can cause drowsiness.  Use caution when driving or operating machinery.    The examination and treatment you have received in our Emergency Department is provided on an emergency basis, and is not intended to be a substitute for your primary care physician.  It is important that your doctor checks you again and that you report any new or remaining problems at that time.      [facility]AC[facility]  [id number]  [address]  [place], [place] [address]

## 2020-04-07 ENCOUNTER — Encounter (INDEPENDENT_AMBULATORY_CARE_PROVIDER_SITE_OTHER): Payer: Self-pay | Admitting: Family Medicine

## 2020-04-07 NOTE — ED Provider Notes (Signed)
History     Chief Complaint   Patient presents with   . Emesis   . Abdominal Pain       Chief Complaint: Abdominal pain  Location: Diffuse  Onset: Chronic  Character: Painful to cramping  Aggravating/Alleviating Factors: Aggravated by history of chronic abdominal pain, cyclical vomiting syndrome. Symptom treatment prescribed meds. Associated diffuse abdominal pain nausea vomiting. No alleviating factors  Timing: Constant  Environment: Home  Severity: Mild to moderate  Context: Patient, with mother, presents for evaluation of abdominal pain. Patient reports persistent chronic diffuse abdominal pain with nausea and vomiting. She states symptoms are unrelieved with her usual medications.    Patient has longstanding history of chronic abdominal pain that has been extensively worked up with imaging and labs. She was evaluated by her GI provider 1 day earlier for the same complaint. She has been taking her usual medications. She was advised to follow-up in a month if she did not improve. Patient has had recent ER visits on 03/31/2016 18th and 19th. On the 19th was evaluated by her primary care physician as well as her GI provider.    Denies fevers chills or sweats URI symptoms dyspnea chest pain or palpitation hematemesis dysuria vaginal discharge pregnancy bloody stools or diarrhea weakness lightness dizziness travel trauma or known ill contacts. There is no history of colitis diverticulitis irritable bowel H. pylori C. difficile gluten allergies lactose intolerance.    PMD  Bridget [name]  Bridget [name]    The history is provided by the patient and medical records. No language interpreter was used.        Past Medical History:   Diagnosis Date   . Abdominal pain    . Asthma     mild seasonal controlled   . Complication of anesthesia    . Cyclical vomiting    . Ehlers-Danlos disease    . Gastroparesis    . Post-operative nausea and vomiting    . POTS (postural orthostatic tachycardia syndrome)     [facility] [place]/stable for many yrs   . Sinus trouble     seasonal   . UTI (urinary tract infection)    . Yeast infection involving the vagina and surrounding area 05/09/2015       Past Surgical History:   Procedure Laterality Date   . ADENOIDECTOMY     . EGD N/A 01/23/2018    Procedure: EGD;  Surgeon: Jacqulyn Cane, MD;  Location: [facility];  Service: Gastroenterology;  Laterality: N/A;  EGD  Q1=N/A   . TONSILLECTOMY     . WISDOM TOOTH EXTRACTION         Family History   Problem Relation Age of Onset   . Cancer Mother    . Ehlers-Danlos syndrome Sister    . Asthma Brother        Social  Social History     Tobacco Use   . Smoking status: Never Smoker   . Smokeless tobacco: Never Used   . Tobacco comment: the patient vapes nicotine    Vaping Use   . Vaping Use: Every day   . Start date: 04/14/2018   Substance Use Topics   . Alcohol use: Not Currently   . Drug use: Yes     Comment: Pt. uses marijuana every day. Pt. reports not using it for the past 3 days.       .     Allergies   Allergen Reactions   . Haloperidol  Interacts with reglan and also pt had involuntary eye movements.    . Latex Itching     Itching irritation swelling condoms  Itching irritation swelling condoms  Itching irritation swelling condoms   . Morphine Rash       Home Medications             diphenhydrAMINE (BENADRYL) 50 MG tablet     Take 50 mg by mouth nightly as needed     imipramine (TOFRANIL) 10 MG tablet          metoclopramide (REGLAN) 10 MG tablet     Take 1 tablet (10 mg total) by mouth 3 (three) times daily as needed (nausea/vomiting)     nitrofurantoin, macrocrystal-monohydrate, (MACROBID) 100 MG capsule     Take 1 tab by mouth as a single dose taken within 2 hours of sexual intercourse     norethindrone-ethinyl estradiol (ORTHO-NOVUM 1/35) 1-35 MG-MCG per tablet     Take 1 tablet by mouth daily     omeprazole (PriLOSEC) 40 MG capsule          ondansetron (ZOFRAN-ODT) 4 MG disintegrating tablet     Take 1 tablet (4 mg  total) by mouth every 6 (six) hours as needed for Nausea     prochlorperazine (COMPAZINE) 5 MG tablet     Take 1 tablet (5 mg total) by mouth every 6 (six) hours as needed for Nausea           Review of Systems   Constitutional: Negative for activity change, chills, diaphoresis, fever and unexpected weight change.   HENT: Negative for congestion, rhinorrhea, sore throat, trouble swallowing and voice change.    Eyes: Negative for pain, discharge and redness.   Respiratory: Negative for cough, chest tightness, shortness of breath and wheezing.    Cardiovascular: Negative for chest pain, palpitations and leg swelling.   Gastrointestinal: Positive for abdominal pain, nausea and vomiting. Negative for abdominal distention, blood in stool and diarrhea.   Endocrine: Negative for polydipsia, polyphagia and polyuria.   Genitourinary: Negative for difficulty urinating, dysuria, frequency, hematuria, urgency, vaginal bleeding and vaginal discharge.        Denies pregnancy   Musculoskeletal: Negative for arthralgias, back pain, myalgias and neck pain.   Skin: Negative for color change, rash and wound.   Allergic/Immunologic: Negative for environmental allergies, food allergies and immunocompromised state.   Neurological: Negative for dizziness, tremors, syncope, weakness, light-headedness, numbness and headaches.   Hematological: Negative for adenopathy. Does not bruise/bleed easily.   Psychiatric/Behavioral: Negative for agitation and confusion. The patient is not nervous/anxious.    All other systems reviewed and are negative.      Physical Exam    BP: 136/84, Heart Rate: 83, Temp: 97.7 F (36.5 C), Resp Rate: 18, SpO2: 99 %, Weight: 46.1 kg    Physical Exam  Vitals and nursing note reviewed.   Constitutional:       General: She is not in acute distress.     Appearance: She is well-developed. She is not ill-appearing, toxic-appearing or diaphoretic.   HENT:      Head: Normocephalic and atraumatic.      Right Ear: External  ear normal.      Left Ear: External ear normal.      Nose: Nose normal. No mucosal edema, congestion or rhinorrhea.      Right Sinus: No maxillary sinus tenderness or frontal sinus tenderness.      Left Sinus: No maxillary sinus tenderness or frontal sinus  tenderness.      Mouth/Throat:      Mouth: Mucous membranes are moist.      Pharynx: No oropharyngeal exudate or posterior oropharyngeal erythema.      Tonsils: No tonsillar abscesses.   Eyes:      General: Lids are normal. No scleral icterus.     Conjunctiva/sclera: Conjunctivae normal.      Right eye: Right conjunctiva is not injected.      Left eye: Left conjunctiva is not injected.      Pupils: Pupils are equal, round, and reactive to light.   Neck:      Thyroid: No thyromegaly.   Cardiovascular:      Rate and Rhythm: Normal rate and regular rhythm.      Pulses: Normal pulses.           Radial pulses are 2+ on the right side.      Heart sounds: Normal heart sounds. No murmur heard.     Pulmonary:      Effort: Pulmonary effort is normal. No respiratory distress.      Breath sounds: Normal breath sounds. No stridor. No wheezing, rhonchi or rales.   Abdominal:      General: Abdomen is flat. Bowel sounds are decreased. There is no distension or abdominal bruit.      Palpations: Abdomen is soft. Abdomen is not rigid. There is no mass or pulsatile mass.      Tenderness: There is generalized abdominal tenderness. There is no right CVA tenderness, left CVA tenderness, guarding or rebound. Negative signs include Murphy's sign and McBurney's sign.      Hernia: No hernia is present.       Musculoskeletal:         General: No swelling, tenderness, deformity or signs of injury. Normal range of motion.      Cervical back: Normal range of motion and neck supple.   Lymphadenopathy:      Cervical: No cervical adenopathy.   Skin:     General: Skin is warm and dry.      Capillary Refill: Capillary refill takes less than 2 seconds.      Findings: No abrasion, ecchymosis,  erythema, laceration, lesion, petechiae or rash.      Nails: There is no clubbing.   Neurological:      Mental Status: She is alert and oriented to person, place, and time.      Sensory: Sensation is intact. No sensory deficit.      Motor: Motor function is intact. No abnormal muscle tone.      Coordination: Coordination is intact. Coordination normal.      Gait: Gait is intact. Gait normal.      Comments: Wb/amb.   Psychiatric:         Attention and Perception: Attention normal.         Mood and Affect: Mood normal.         Speech: Speech normal.         Behavior: Behavior normal. Behavior is cooperative.         Thought Content: Thought content normal.         Cognition and Memory: Cognition normal.         Judgment: Judgment normal.           MDM and ED Course     ED Medication Orders (From admission, onward)    Start Ordered     Status Ordering Provider    04/04/20 2035 04/04/20 2034  diphenhydrAMINE (BENADRYL) injection 25 mg  Once     Route: Intravenous  Ordered Dose: 25 mg     Last MAR action: Given Redmond Whittley A    04/04/20 1945 04/04/20 1944  dicyclomine (BENTYL) injection 10 mg  Once     Route: Intramuscular  Ordered Dose: 10 mg     Last MAR action: Given Khylah Kendra A    04/04/20 1925 04/04/20 1924  sodium chloride 0.9 % bolus 1,000 mL  Once     Route: Intravenous  Ordered Dose: 1,000 mL     Last MAR action: Stopped PULIZZI, MICHAEL JEFFREY    04/04/20 1724 04/04/20 1723  metoclopramide (REGLAN) injection 10 mg  Once     Route: Intravenous  Ordered Dose: 10 mg     Last MAR action: Given Traniya Prichett A    04/04/20 1724 04/04/20 1723    Once     Route: Intravenous  Ordered Dose: 1,000 mL     Discontinued Yogi Arther A    04/04/20 1700 04/04/20 1659  sodium chloride 0.9 % bolus 1,000 mL  Once     Route: Intravenous  Ordered Dose: 1,000 mL     Last MAR action: Stopped PULIZZI, MICHAEL JEFFREY    04/04/20 1700 04/04/20 1659  haloperidol lactate (HALDOL) injection 5 mg  Once     Route: Intravenous   Ordered Dose: 5 mg     Last MAR action: Given PULIZZI, MICHAEL JEFFREY             MDM  Number of Diagnoses or Management Options  Chronic generalized abdominal pain: established and worsening  Cyclical vomiting syndrome: established and worsening  Elevated blood pressure reading without diagnosis of hypertension: new and does not require workup  Hypochloremia: new and does not require workup  Metabolic acidosis, normal anion gap (NAG): new and does not require workup  Non-intractable vomiting with nausea, unspecified vomiting type: established and worsening  Diagnosis management comments: When I was within 6 feet of this patient I donned the following PPE:  Surgical Mask Yes, Gloves Yes, Gown The patient was wearing a mask during my evaluation Yes.    Plan: IV, fluids, labs, meds, supportive care.    The attending signature signifies review and agreement of the history , PE, evaluation, clinical impression and discharge plan except as otherwise noted.    I,Chakira Jachim, PA-C, have been the primary provider for Bridget [name] during this Emergency Dept visit.    Oxygen saturation by pulse oximetry is 95%-100%, Normal.  Interventions: None Needed.    DDX to include, but not limited to:  Chronic cyclical vomiting syndrome, dehydration, anemia, electrolyte disturbance, pancreatitis, uti, pregnancy  Plan:  Home supportive care, pmd/GI referral, continue current prescribed meds.    Results     Procedure Component Value Units Date/Time    CBC and differential ([id number])  (Abnormal) Collected: 04/04/20 1728    Specimen: Blood Updated: 04/04/20 1803     WBC 7.69 x10 3/uL      Hgb 16.2 g/dL      Hematocrit 40.9 %      Platelets 340 x10 3/uL      RBC 5.22 x10 6/uL      MCV 88.9 fL      MCH 31.0 pg      MCHC 34.9 g/dL      RDW 13 %      MPV 10.1 fL      Neutrophils 63.5 %      Lymphocytes  Automated 28.7 %      Monocytes 6.9 %      Eosinophils Automated 0.1 %      Basophils Automated 0.5 %      Immature Granulocytes 0.3  %      Nucleated RBC 0.0 /100 WBC      Neutrophils Absolute 4.88 x10 3/uL      Lymphocytes Absolute Automated 2.21 x10 3/uL      Monocytes Absolute Automated 0.53 x10 3/uL      Eosinophils Absolute Automated 0.01 x10 3/uL      Basophils Absolute Automated 0.04 x10 3/uL      Immature Granulocytes Absolute 0.02 x10 3/uL      Absolute NRBC 0.00 x10 3/uL     Comprehensive metabolic panel ([id number])  (Abnormal) Collected: 04/04/20   1728    Specimen: Blood Updated: 04/04/20 1755     Glucose 76 mg/dL      BUN 8.0 mg/dL      Creatinine 0.9 mg/dL      Sodium 469 mEq/L      Potassium 3.5 mEq/L      Chloride 98 mEq/L      CO2 18 mEq/L      Calcium 10.5 mg/dL      Protein, Total 8.4 g/dL      Albumin 5.0 g/dL      AST (SGOT) 16 U/L      ALT 19 U/L      Alkaline Phosphatase 63 U/L      Bilirubin, Total 1.0 mg/dL      Globulin 3.4 g/dL      Albumin/Globulin Ratio 1.5     Anion Gap 21.0    Lipase ([id number]) Collected: 04/04/20 1728    Specimen: Blood Updated: 04/04/20 1755     Lipase 33 U/L     Magnesium ([id number]) Collected: 04/04/20 1728    Specimen: Blood Updated: 04/04/20 1755     Magnesium 2.0 mg/dL     GFR ([id number]) Collected: 04/04/20 1728     Updated: 04/04/20 1755     EGFR >60.0        Radiology Results (24 Hour)     ** No results found for the last 24 hours. **        I have reviewed all labs and/or radiological studies. I have reviewed all xrays if any myself on the PACS system.      " *This note was generated by the Epic EMR system/ Dragon speech recognition and   may contain inherent errors or omissions not intended by the user. Grammatical    errors, random word insertions, deletions, pronoun errors and incomplete   sentences are occasional consequences of this technology due to software   limitations. Not all errors are caught or corrected. If there are questions or    concerns about the content of this note or information contained within the body   of this dictation they should be addressed directly  with the author for   Clarification."       Amount and/or Complexity of Data Reviewed  Clinical lab tests: ordered and reviewed  Tests in the radiology section of CPT: reviewed  Tests in the medicine section of CPT: ordered and reviewed  Review and summarize past medical records: yes (Patient's old chart reviewed from ED and revealed ED, primary care and GI visits for chronic abdominal pain, chronic and persistent cyclical vomiting syndrome. There is no history of colitis diverticulitis irritable bowel pancreatitis enteritis gastritis bowel obstruction  )  Discuss the patient with other providers: yes (ED case d/w attending, pulizzi, m, who examined the patient and agrees with the current course, tx, and disposition plan.)  Independent visualization of images, tracings, or specimens: yes    Risk of Complications, Morbidity, and/or Mortality  Presenting problems: high  Diagnostic procedures: high  Management options: high    Patient Progress  Patient progress: stable        ED Course as of Apr 08 1047   Fri Apr 04, 2020   1944 Patient rechecked. Initial lab results reviewed. Abnormalities are discussed. She states nausea is improved but she still notes some cramping pain. Will give additional medication continue to hydrate and reassess.    [KS]   2020 Patient recheck. No change in abdominal pain. She still notes crampiness and pain. Nausea has been resolved with meds. We will continue to monitor and reassess. Exam is unchanged    [KS]   2130 Patient has had 2 L of fluid. She is stable and ready for discharge. There is not complete resolution of her pain at this time however she feels comfortable to go home. She is tolerated p.o. challenge. Questions were answered. They voiced understanding of follow-up instructions. Strict return precautions advised. Stable and ready for discharge    [KS]      ED Course User Index  [KS] Fredia Chittenden, Selinda Flavin, PA             Procedures    Clinical Impression & Disposition     Clinical  Impression  Final diagnoses:   Chronic generalized abdominal pain   Non-intractable vomiting with nausea, unspecified vomiting type   Cyclical vomiting syndrome   Metabolic acidosis, normal anion gap (NAG)   Hypochloremia   Elevated blood pressure reading without diagnosis of hypertension        ED Disposition     ED Disposition Condition Date/Time Comment    Discharge  Fri Apr 04, 2020  9:42 PM Bridget [name] discharge to home/self care.    Condition at disposition: Stable           Discharge Medication List as of 04/04/2020  9:42 PM                    Kai Calico, Selinda Flavin, PA  04/07/20 1048       Herma Ard, MD  04/07/20 1734

## 2020-04-23 ENCOUNTER — Encounter (INDEPENDENT_AMBULATORY_CARE_PROVIDER_SITE_OTHER): Payer: Self-pay

## 2020-06-23 ENCOUNTER — Encounter (INDEPENDENT_AMBULATORY_CARE_PROVIDER_SITE_OTHER): Payer: Self-pay

## 2020-06-24 ENCOUNTER — Encounter (INDEPENDENT_AMBULATORY_CARE_PROVIDER_SITE_OTHER): Payer: Self-pay

## 2020-06-27 ENCOUNTER — Encounter (INDEPENDENT_AMBULATORY_CARE_PROVIDER_SITE_OTHER): Payer: Self-pay | Admitting: Family Medicine

## 2020-06-27 ENCOUNTER — Ambulatory Visit (INDEPENDENT_AMBULATORY_CARE_PROVIDER_SITE_OTHER): Payer: No Typology Code available for payment source | Admitting: Family Medicine

## 2020-06-27 VITALS — BP 110/68 | HR 68 | Temp 98.8°F | Resp 14 | Ht 64.0 in | Wt 105.4 lb

## 2020-06-27 DIAGNOSIS — I498 Other specified cardiac arrhythmias: Secondary | ICD-10-CM

## 2020-06-27 DIAGNOSIS — R1115 Cyclical vomiting syndrome unrelated to migraine: Secondary | ICD-10-CM

## 2020-06-27 DIAGNOSIS — Q796 Ehlers-Danlos syndrome, unspecified: Secondary | ICD-10-CM

## 2020-06-27 DIAGNOSIS — G90A Postural orthostatic tachycardia syndrome (POTS): Secondary | ICD-10-CM

## 2020-06-27 NOTE — Progress Notes (Signed)
[facility]                       Date of Exam: 06/27/2020 5:31 PM        Patient ID: Bridget [name] is a 24 y.o. female.  Attending Physician: Gilford Raid, MD        Chief Complaint:    Chief Complaint   Patient presents with   . Dizziness     x 2 d   . Weight Loss     Lost weight in June & Aug when admitted for nausea & vomiting, never gained back and still losing weight, and have  no appetite   . Nausea     x 5 m   . Abdominal Pain     intermittent over a period of years               HPI:    HPI   Mom made her come in today.   She is in school and lives in own appt.   She eats little,   Has virtual appt with GI.   yest started to feel dizzy,   She has a known h/o POTS and  Ehler-Danlos,  Has h/o intractable vom,  June and Aug  And Jan and  May,  ED visits, addmitted.   Has "delayed gastric emptying"    Has been on reglan, which has helped.    Has been given haldol too, had "occulogyro crisis"          She sees Dr Cathlyn Parsons at [facility] for POTS,   And Dr Gustavo Lah is GI,  Recently started her on erytho from GI.  Started in the last 2 d..  She does drink  Water thru out day, 6  X 12 oz water bottles,   Urinates  q 2-3 hrs and is clear to light yellow.     Goal for today is to get some labs and an exam,  Then be better prepared to see specialists about her treatment.   She is not trying to loose wt, she is not binging or purging,   She does not exercise excessively.           Problem List:    Patient Active Problem List   Diagnosis   . Postcoital UTI   . Asthma   . Delayed gastric emptying   . Ehlers-Danlos syndrome   . Endometriosis   . Postural orthostatic tachycardia syndrome   . ASCUS with positive high risk HPV cervical   . Cyclic vomiting syndrome             Current Meds:    Outpatient Medications Marked as Taking for the 06/27/20 encounter (Office Visit) with Gilford Raid, MD   Medication Sig Dispense Refill   . Caraway Oil-Levomenthol (FDGARD PO)  Take by mouth     . cloNIDine (CATAPRES) 0.1 MG tablet Take 0.05 mg by mouth nightly     . diphenhydrAMINE (BENADRYL) 50 MG tablet Take 50 mg by mouth nightly as needed     . Erythromycin 250 MG Tablet Delayed Response 1 tab Orally Four times a day 14 days     . norethindrone-ethinyl estradiol (ORTHO-NOVUM 1/35) 1-35 MG-MCG per tablet Take 1 tablet by mouth daily 84 tablet 3   . omeprazole (PriLOSEC) 40 MG capsule daily        . ondansetron (ZOFRAN-ODT) 4 MG disintegrating tablet Take 1 tablet (4  mg total) by mouth every 6 (six) hours as needed for Nausea 12 tablet 0   . pyridostigmine (MESTINON) 60 MG tablet Take 60 mg by mouth 2 (two) times daily            Allergies:    Allergies   Allergen Reactions   . Haloperidol      Interacts with reglan and also pt had involuntary eye movements.    . Latex Itching     Itching irritation swelling condoms  Itching irritation swelling condoms  Itching irritation swelling condoms   . Morphine Rash             Past Surgical History:    Past Surgical History:   Procedure Laterality Date   . ADENOIDECTOMY     . EGD N/A 01/23/2018    Procedure: EGD;  Surgeon: Jacqulyn Cane, MD;  Location: [facility];  Service: Gastroenterology;  Laterality: N/A;  EGD  Q1=N/A   . TONSILLECTOMY     . WISDOM TOOTH EXTRACTION             Family History:    Family History   Problem Relation Age of Onset   . Cancer Mother    . Ehlers-Danlos syndrome Sister    . Asthma Brother            Social History:    Social History     Tobacco Use   . Smoking status: Never Smoker   . Smokeless tobacco: Never Used   . Tobacco comment: the patient vapes nicotine    Vaping Use   . Vaping Use: Every day   . Start date: 04/14/2018   Substance Use Topics   . Alcohol use: Not Currently   . Drug use: Yes     Types: Marijuana     Comment: Pt. uses marijuana every day. Pt. reports not using it for the past 3 days.          The following sections were reviewed this encounter by the provider:   Tobacco  Allergies   Meds  Problems  Med Hx  Surg Hx  Fam Hx             Vital Signs:    BP 110/68 (BP Site: Left arm, Patient Position: Sitting, Cuff Size: Small)   Pulse 68   Temp 98.8 F (37.1 C) (Tympanic)   Resp 14   Ht 1.626 m (5\' 4" )   Wt 47.8 kg (105 lb 6.4 oz)   BMI 18.09 kg/m          ROS:    Review of Systems   No CP,SOB,dizziness or edema, cough, no fever, rash or jt pains or myalgia.        Physical Exam:    Physical Exam  Vitals and nursing note reviewed.   Constitutional:       Appearance: Normal appearance.      Comments: Very thin , underweight   HENT:      Head: Normocephalic and atraumatic.      Right Ear: Tympanic membrane, ear canal and external ear normal.      Left Ear: Tympanic membrane, ear canal and external ear normal.      Mouth/Throat:      Mouth: Mucous membranes are moist.      Pharynx: Oropharynx is clear.   Eyes:      General: No scleral icterus.     Extraocular Movements: Extraocular movements intact.      Conjunctiva/sclera:  Conjunctivae normal.      Pupils: Pupils are equal, round, and reactive to light.   Neck:      Thyroid: No thyroid mass, thyromegaly or thyroid tenderness.   Cardiovascular:      Rate and Rhythm: Normal rate and regular rhythm.      Pulses: Normal pulses.      Heart sounds: Normal heart sounds.   Pulmonary:      Effort: Pulmonary effort is normal.      Breath sounds: Normal breath sounds.   Abdominal:      General: Abdomen is flat. Bowel sounds are normal.      Palpations: Abdomen is soft.   Musculoskeletal:      Cervical back: Normal range of motion and neck supple.   Skin:     General: Skin is warm.   Neurological:      General: No focal deficit present.      Mental Status: She is alert. Mental status is at baseline.   Psychiatric:         Mood and Affect: Mood normal.         Behavior: Behavior normal.         Thought Content: Thought content normal.         Judgment: Judgment normal.              Assessment:    1. Postural orthostatic tachycardia syndrome    2.  Cyclic vomiting syndrome    3. Ehlers-Danlos syndrome  - CBC and differential  - Comprehensive metabolic panel  - Prealbumin  - TSH            Plan:    Start with above labs and see specialists as planned.  The following activities were performed on the date of service:  preparing to see the patient: - chart review   - review of prior labs  - review of consultant reports  obtaining and/or reviewing the separately obtained history  performing a medically appropriate examination and/or evaluation   ordering medications, tests, or procedures  documenting clinical information in the electronic or other health records  Total time spent performing activities on date of service:  40  minutes                  Follow-up:    No follow-ups on file.         Gilford Raid, MD

## 2020-06-28 LAB — COMPREHENSIVE METABOLIC PANEL
ALT: 8 IU/L (ref 0–32)
AST (SGOT): 14 IU/L (ref 0–40)
African American eGFR: 144 mL/min/{1.73_m2} (ref >59)
Albumin/Globulin Ratio: 2 (ref 1.2–2.2)
Albumin: 4.5 g/dL (ref 3.9–5.0)
Alkaline Phosphatase: 50 IU/L (ref 44–121)
BUN / Creatinine Ratio: 9 (ref 9–23)
BUN: 6 mg/dL (ref 6–20)
Bilirubin, Total: 0.3 mg/dL (ref 0.0–1.2)
CO2: 19 mmol/L — ABNORMAL LOW (ref 20–29)
Calcium: 9.8 mg/dL (ref 8.7–10.2)
Chloride: 104 mmol/L (ref 96–106)
Creatinine: 0.68 mg/dL (ref 0.57–1.00)
Globulin, Total: 2.2 g/dL (ref 1.5–4.5)
Glucose: 90 mg/dL (ref 65–99)
Potassium: 5.1 mmol/L (ref 3.5–5.2)
Protein, Total: 6.7 g/dL (ref 6.0–8.5)
Sodium: 139 mmol/L (ref 134–144)
non-African American eGFR: 125 mL/min/{1.73_m2} (ref >59)

## 2020-06-28 LAB — CBC AND DIFFERENTIAL
Baso(Absolute): 0.1 10*3/uL (ref 0.0–0.2)
Basos: 1 %
Eos: 0 %
Eosinophils Absolute: 0 10*3/uL (ref 0.0–0.4)
Hematocrit: 42.6 % (ref 34.0–46.6)
Hemoglobin: 13.8 g/dL (ref 11.1–15.9)
Immature Granulocytes Absolute: 0 10*3/uL (ref 0.0–0.1)
Immature Granulocytes: 0 %
Lymphocytes Absolute: 2.2 10*3/uL (ref 0.7–3.1)
Lymphocytes: 31 %
MCH: 30 pg (ref 26.6–33.0)
MCHC: 32.4 g/dL (ref 31.5–35.7)
MCV: 93 fL (ref 79–97)
Monocytes Absolute: 0.4 10*3/uL (ref 0.1–0.9)
Monocytes: 6 %
Neutrophils Absolute: 4.3 10*3/uL (ref 1.4–7.0)
Neutrophils: 62 %
Platelets: 281 10*3/uL (ref 150–450)
RBC: 4.6 x10E6/uL (ref 3.77–5.28)
RDW: 12 % (ref 11.7–15.4)
WBC: 7 10*3/uL (ref 3.4–10.8)

## 2020-06-28 LAB — TSH: TSH: 0.953 u[IU]/mL (ref 0.450–4.500)

## 2020-06-28 LAB — PREALBUMIN: Prealbumin: 26 mg/dL (ref 14–35)

## 2020-06-30 ENCOUNTER — Encounter (INDEPENDENT_AMBULATORY_CARE_PROVIDER_SITE_OTHER): Payer: Self-pay | Admitting: Family Medicine

## 2020-07-23 ENCOUNTER — Encounter (INDEPENDENT_AMBULATORY_CARE_PROVIDER_SITE_OTHER): Payer: Self-pay

## 2020-07-24 ENCOUNTER — Encounter (INDEPENDENT_AMBULATORY_CARE_PROVIDER_SITE_OTHER): Payer: Self-pay

## 2020-07-30 ENCOUNTER — Emergency Department
Admission: EM | Admit: 2020-07-30 | Discharge: 2020-07-30 | Disposition: A | Discharge status: Home / Self Care | Payer: No Typology Code available for payment source | Attending: Emergency Medical Services | Admitting: Emergency Medical Services

## 2020-07-30 DIAGNOSIS — E860 Dehydration: Secondary | ICD-10-CM | POA: Insufficient documentation

## 2020-07-30 DIAGNOSIS — R1115 Cyclical vomiting syndrome unrelated to migraine: Secondary | ICD-10-CM | POA: Insufficient documentation

## 2020-07-30 LAB — URINALYSIS REFLEX TO MICROSCOPIC EXAM - REFLEX TO CULTURE
Bilirubin, UA: NEGATIVE
Blood, UA: NEGATIVE
Glucose, UA: NEGATIVE
Ketones UA: 80 — AB
Leukocyte Esterase, UA: NEGATIVE
Nitrite, UA: NEGATIVE
Protein, UR: 100 — AB
Specific Gravity UA: 1.023 (ref 1.001–1.035)
Urine pH: 6 (ref 5.0–8.0)
Urobilinogen, UA: NORMAL mg/dL (ref 0.2–2.0)

## 2020-07-30 LAB — CBC AND DIFFERENTIAL
Absolute NRBC: 0 10*3/uL (ref 0.00–0.00)
Basophils Absolute Automated: 0.03 10*3/uL (ref 0.00–0.08)
Basophils Automated: 0.2 %
Eosinophils Absolute Automated: 0 10*3/uL (ref 0.00–0.44)
Eosinophils Automated: 0 %
Hematocrit: 44.4 % — ABNORMAL HIGH (ref 34.7–43.7)
Hgb: 14.8 g/dL (ref 11.4–14.8)
Immature Granulocytes Absolute: 0.06 10*3/uL (ref 0.00–0.07)
Immature Granulocytes: 0.4 %
Lymphocytes Absolute Automated: 1.67 10*3/uL (ref 0.42–3.22)
Lymphocytes Automated: 11.9 %
MCH: 29.9 pg (ref 25.1–33.5)
MCHC: 33.3 g/dL (ref 31.5–35.8)
MCV: 89.7 fL (ref 78.0–96.0)
MPV: 9.6 fL (ref 8.9–12.5)
Monocytes Absolute Automated: 0.78 10*3/uL (ref 0.21–0.85)
Monocytes: 5.6 %
Neutrophils Absolute: 11.45 10*3/uL — ABNORMAL HIGH (ref 1.10–6.33)
Neutrophils: 81.9 %
Nucleated RBC: 0 /100 WBC (ref 0.0–0.0)
Platelets: 366 10*3/uL — ABNORMAL HIGH (ref 142–346)
RBC: 4.95 10*6/uL (ref 3.90–5.10)
RDW: 13 % (ref 11–15)
WBC: 13.99 10*3/uL — ABNORMAL HIGH (ref 3.10–9.50)

## 2020-07-30 LAB — COMPREHENSIVE METABOLIC PANEL
ALT: 15 U/L (ref 0–55)
AST (SGOT): 17 U/L (ref 5–34)
Albumin/Globulin Ratio: 1.4 (ref 0.9–2.2)
Albumin: 4.6 g/dL (ref 3.5–5.0)
Alkaline Phosphatase: 57 U/L (ref 37–106)
Anion Gap: 13 (ref 5.0–15.0)
BUN: 12 mg/dL (ref 7–19)
Bilirubin, Total: 0.6 mg/dL (ref 0.2–1.2)
CO2: 21 mEq/L — ABNORMAL LOW (ref 22–29)
Calcium: 10.4 mg/dL (ref 8.5–10.5)
Chloride: 104 mEq/L (ref 100–111)
Creatinine: 0.8 mg/dL (ref 0.6–1.0)
Globulin: 3.2 g/dL (ref 2.0–3.6)
Glucose: 110 mg/dL — ABNORMAL HIGH (ref 70–100)
Potassium: 3.8 mEq/L (ref 3.5–5.1)
Protein, Total: 7.8 g/dL (ref 6.0–8.3)
Sodium: 138 mEq/L (ref 136–145)

## 2020-07-30 LAB — LIPASE: Lipase: 34 U/L (ref 8–78)

## 2020-07-30 LAB — GFR: EGFR: >60

## 2020-07-30 LAB — HCG, SERUM, QUALITATIVE: Hcg Qualitative: NEGATIVE

## 2020-07-30 MED ORDER — DIPHENHYDRAMINE HCL 50 MG/ML IJ SOLN
25.0000 mg | Freq: Once | INTRAMUSCULAR | Status: AC
Start: 2020-07-30 — End: 2020-07-30
  Administered 2020-07-30: 13:00:00 25 mg via INTRAVENOUS
  Filled 2020-07-30: qty 1

## 2020-07-30 MED ORDER — THIAMINE HCL 100 MG/ML IJ SOLN
1000.0000 mL/h | Freq: Once | INTRAVENOUS | Status: AC
Start: 2020-07-30 — End: 2020-07-30
  Administered 2020-07-30: 13:00:00 1000 mL/h via INTRAVENOUS
  Filled 2020-07-30: qty 1000

## 2020-07-30 MED ORDER — SODIUM CHLORIDE 0.9 % IV SOLN
12.5000 mg | Freq: Once | INTRAVENOUS | Status: AC
Start: 2020-07-30 — End: 2020-07-30
  Administered 2020-07-30: 13:00:00 12.5 mg via INTRAVENOUS
  Filled 2020-07-30: qty 1

## 2020-07-30 NOTE — ED Triage Notes (Signed)
Vomiting onset yesterday, prior episodes thought to have gastroparesis

## 2020-07-30 NOTE — ED Provider Notes (Signed)
[facility]        [facility] EMERGENCY DEPARTMENT HISTORY AND PHYSICAL EXAM    Date: 07/30/2020  Patient Name: Bridget [name]  Attending Physician: Clovis Riley, MD  Patient DOB:  06-04-1996  MRN:  [id number]  Room:  12/A12        History     Chief Complaint   Patient presents with   . Emesis       Historian: Patient and her mother  Onset:   24 hours prior to arrival  Severity:  severe   Modifiers: wore after any po  Quality: Nausea and vomiting     The patient Bridget [name], is a 24 y.o. female with a history of Ehlers-Danlos, cyclic vomiting, possible cannabinoid hyperemesis syndrome, and POTS as well as frequent marijuana use who presents with severe nausea and vomiting along with mild upper abdominal pain which began about 24 hours prior to arrival.  Her symptoms are similar to her previous symptoms however not quite as severe which she attributes to coming to the ED sooner rather than later.          PCP:  Reynold Bowen, MD      Past Medical History       Past Medical History:   Diagnosis Date   . Abdominal pain    . Asthma     mild seasonal controlled   . Complication of anesthesia    . Cyclical vomiting    . Ehlers-Danlos disease    . Gastroparesis    . Post-operative nausea and vomiting    . POTS (postural orthostatic tachycardia syndrome)     [facility]/stable for many yrs   . Sinus trouble     seasonal   . UTI (urinary tract infection)    . Yeast infection involving the vagina and surrounding area 05/09/2015         Past Surgical History       Past Surgical History:   Procedure Laterality Date   . ADENOIDECTOMY     . EGD N/A 01/23/2018    Procedure: EGD;  Surgeon: Jacqulyn Cane, MD;  Location: [facility];  Service: Gastroenterology;  Laterality: N/A;  EGD  Q1=N/A   . TONSILLECTOMY     . WISDOM TOOTH EXTRACTION           Family History    Family History   Problem Relation Age of Onset   . Cancer Mother    .  Ehlers-Danlos syndrome Sister    . Asthma Brother        Social History    Social History     Socioeconomic History   . Marital status: Single     Spouse name: None   . Number of children: None   . Years of education: None   . Highest education level: None   Occupational History   . None   Tobacco Use   . Smoking status: Never Smoker   . Smokeless tobacco: Never Used   . Tobacco comment: the patient vapes nicotine    Vaping Use   . Vaping Use: Every day   . Start date: 04/14/2018   Substance and Sexual Activity   . Alcohol use: Not Currently   . Drug use: Yes     Types: Marijuana     Comment: Pt. uses marijuana every day. Pt. reports not using it for the past 3 days.   . Sexual activity: Never  Comment: takes the Pill continuously   Other Topics Concern   . Poor school performance No   . International travel in past 12 mos No   Social History Narrative   . None     Social Determinants of Health     Financial Resource Strain: Low Risk    . Difficulty of Paying Living Expenses: Not hard at all   Food Insecurity: No Food Insecurity   . Worried About Programme researcher, broadcasting/film/video in the Last Year: Never true   . Ran Out of Food in the Last Year: Never true   Transportation Needs: No Transportation Needs   . Lack of Transportation (Medical): No   . Lack of Transportation (Non-Medical): No   Physical Activity:    . Days of Exercise per Week: Not on file   . Minutes of Exercise per Session: Not on file   Stress:    . Feeling of Stress : Not on file   Social Connections:    . Frequency of Communication with Friends and Family: Not on file   . Frequency of Social Gatherings with Friends and Family: Not on file   . Attends Religious Services: Not on file   . Active Member of Clubs or Organizations: Not on file   . Attends Banker Meetings: Not on file   . Marital Status: Not on file   Intimate Partner Violence:    . Fear of Current or Ex-Partner: Not on file   . Emotionally Abused: Not on file   . Physically Abused: Not  on file   . Sexually Abused: Not on file   Housing Stability:    . Unable to Pay for Housing in the Last Year: Not on file   . Number of Places Lived in the Last Year: Not on file   . Unstable Housing in the Last Year: Not on file       Allergies    Allergies   Allergen Reactions   . Haloperidol      Interacts with reglan and also pt had involuntary eye movements.    . Latex Itching     Itching irritation swelling condoms  Itching irritation swelling condoms  Itching irritation swelling condoms   . Reglan [Metoclopramide]    . Morphine Rash         Current/Home Medications    Current/Home Medications    CARAWAY OIL-LEVOMENTHOL (FDGARD PO)    Take by mouth    CLONIDINE (CATAPRES) 0.1 MG TABLET    Take 0.05 mg by mouth nightly    DIPHENHYDRAMINE (BENADRYL) 50 MG TABLET    Take 50 mg by mouth nightly as needed    ERYTHROMYCIN 250 MG TABLET DELAYED RESPONSE    1 tab Orally Four times a day 14 days    IMIPRAMINE (TOFRANIL) 10 MG TABLET        METOCLOPRAMIDE (REGLAN) 10 MG TABLET    Take 1 tablet (10 mg total) by mouth 3 (three) times daily as needed (nausea/vomiting)    NITROFURANTOIN, MACROCRYSTAL-MONOHYDRATE, (MACROBID) 100 MG CAPSULE    Take 1 tab by mouth as a single dose taken within 2 hours of sexual intercourse    NORETHINDRONE-ETHINYL ESTRADIOL (ORTHO-NOVUM 1/35) 1-35 MG-MCG PER TABLET    Take 1 tablet by mouth daily    OMEPRAZOLE (PRILOSEC) 40 MG CAPSULE    daily       ONDANSETRON (ZOFRAN-ODT) 4 MG DISINTEGRATING TABLET    Take 1 tablet (4 mg total) by mouth  every 6 (six) hours as needed for Nausea    PROCHLORPERAZINE (COMPAZINE) 5 MG TABLET    Take 1 tablet (5 mg total) by mouth every 6 (six) hours as needed for Nausea    PYRIDOSTIGMINE (MESTINON) 60 MG TABLET    Take 60 mg by mouth 2 (two) times daily       Home meds reviewed by ED MD       Vital Signs     BP 101/77   Pulse (!) 107   Temp 98.1 F (36.7 C) (Oral)   Resp 20   Wt 46 kg   SpO2 98%   BMI 17.41 kg/m   Patient Vitals for the past 24 hrs:   BP  Temp Temp src Pulse Resp SpO2 Weight   07/30/20 1204 101/77 98.1 F (36.7 C) Oral (!) 107 20 98 % -   07/30/20 1201 - - - - 18 - 46 kg         Review of Systems     Review of Systems   Constitutional: Positive for malaise/fatigue. Negative for fever.   Gastrointestinal: Positive for abdominal pain, nausea and vomiting.   Genitourinary: Negative for dysuria.   All other systems reviewed and are negative.        Physical Exam     CONSTITUTIONAL   Vital Signs Reviewed, thin, in, almost cachectic afebrile, Pleasant  HEAD   Atraumatic, Normocephalic.  EYES   Normal to inspection, No discharge from eyes.  ENT    Mucus membranes moist, No pharyngeal edema.    NECK   Normal inspection, No JVD.  RESPIRATORY CHEST   No respiratory distress, Breath sounds normal.  CARDIOVASCULAR   Normal rate and regular rhythm.  ABDOMEN   Non-distended, Normal bowel sounds, Soft, Moderate epigastric tenderness, No peritoneal signs.  BACK   Normal inspection, No tenderness.  UPPER EXTREMITY   Inspection normal, No cyanosis.  LOWER EXTREMITY   No edema, No cyanosis.  NEURO   Alert and oriented, Normal speech, No focal motor or sensory deficits.  SKIN   Warm and dry without obvious rash  PSYCHIATRIC   Alert and oriented, Normal affect, Normal concentration.      ED Medication Orders     ED Medication Orders (From admission, onward)    Start Ordered     Status Ordering Provider    07/30/20 1238 07/30/20 1237  sodium chloride 0.9 % 1,000 mL with thiamine 100 mg, folic acid 1 mg, m.v.i. adult 10 mL, magnesium sulfate 2,000 mg infusion  Once        Route: Intravenous  Ordered Dose: 1,000 mL/hr     Last MAR action: New Bag Tim Corriher W    07/30/20 1238 07/30/20 1237  diphenhydrAMINE (BENADRYL) injection 25 mg  Once        Route: Intravenous  Ordered Dose: 25 mg     Last MAR action: Given Rajveer Handler W    07/30/20 1238 07/30/20 1237  promethazine (PHENERGAN) 12.5 mg in sodium chloride 0.9 % 50 mL IVPB  Once        Route: Intravenous  Ordered  Dose: 12.5 mg     Last MAR action: Given Maryellen Dowdle W          Orders Placed During this Encounter     Orders Placed This Encounter   Procedures   . CBC and differential   . Comprehensive metabolic panel   . Lipase   . Urinalysis Reflex to Microscopic Exam- Reflex to  Culture   . GFR   . Beta HCG, Qual, Serum   . Diet NPO effective now   . Saline lock IV       Diagnostic Study Results     Labs     Results     Procedure Component Value Units Date/Time    Urinalysis Reflex to Microscopic Exam- Reflex to Culture [[id number]]  (Abnormal) Collected: 07/30/20 1408     Updated: 07/30/20 1425     Urine Type Urine, Clean Ca     Color, UA Yellow     Clarity, UA Clear     Specific Gravity UA 1.023     Urine pH 6.0     Leukocyte Esterase, UA Negative     Nitrite, UA Negative     Protein, UR 100     Glucose, UA Negative     Ketones UA 80     Urobilinogen, UA Normal mg/dL      Bilirubin, UA Negative     Blood, UA Negative     RBC, UA 0 - 2 /hpf      WBC, UA 0 - 5 /hpf      Squamous Epithelial Cells, Urine 0 - 5 /hpf      Urine Mucus Present    Beta HCG, Qual, Serum [[id number]] Collected: 07/30/20 1220     Updated: 07/30/20 1325     Hcg Qualitative Negative    Lipase [[id number]] Collected: 07/30/20 1220    Specimen: Blood Updated: 07/30/20 1245     Lipase 34 U/L     GFR [[id number]] Collected: 07/30/20 1220     Updated: 07/30/20 1245     EGFR >60.0    Comprehensive metabolic panel [[id number]]  (Abnormal) Collected: 07/30/20 1220    Specimen: Blood Updated: 07/30/20 1245     Glucose 110 mg/dL      BUN 12 mg/dL      Creatinine 0.8 mg/dL      Sodium 244 mEq/L      Potassium 3.8 mEq/L      Chloride 104 mEq/L      CO2 21 mEq/L      Calcium 10.4 mg/dL      Protein, Total 7.8 g/dL      Albumin 4.6 g/dL      AST (SGOT) 17 U/L      ALT 15 U/L      Alkaline Phosphatase 57 U/L      Bilirubin, Total 0.6 mg/dL      Globulin 3.2 g/dL      Albumin/Globulin Ratio 1.4     Anion Gap 13.0    CBC and differential [[id number]]  (Abnormal) Collected:  07/30/20 1220    Specimen: Blood Updated: 07/30/20 1229     WBC 13.99 x10 3/uL      Hgb 14.8 g/dL      Hematocrit 64.4 %      Platelets 366 x10 3/uL      RBC 4.95 x10 6/uL      MCV 89.7 fL      MCH 29.9 pg      MCHC 33.3 g/dL      RDW 13 %      MPV 9.6 fL      Neutrophils 81.9 %      Lymphocytes Automated 11.9 %      Monocytes 5.6 %      Eosinophils Automated 0.0 %      Basophils Automated 0.2 %      Immature Granulocytes 0.4 %  Nucleated RBC 0.0 /100 WBC      Neutrophils Absolute 11.45 x10 3/uL      Lymphocytes Absolute Automated 1.67 x10 3/uL      Monocytes Absolute Automated 0.78 x10 3/uL      Eosinophils Absolute Automated 0.00 x10 3/uL      Basophils Absolute Automated 0.03 x10 3/uL      Immature Granulocytes Absolute 0.06 x10 3/uL      Absolute NRBC 0.00 x10 3/uL           Radiologic Studies  Radiology Results (24 Hour)     ** No results found for the last 24 hours. **      .      EKG         Clinical Course / MDM       Notes:     Patient feels much better after IV banana bag along with IV Phenergan and Benadryl.    Fruitful discussion with patient and her mother regarding the possibility that marijuana is either causing or at least contributing to her symptoms.  I urged her to refrain from using marijuana for at least 30 days and she seemed willing to do this.    The patient is currently followed by GI at [facility] with whom she will follow up.      The patient and her mother understand and agree with the plan.      This note was generated by the Epic EMR system/ Dragon speech recognition and may contain inherent errors or omissions not intended by the user. Grammatical errors, random word insertions, deletions, pronoun errors and incomplete sentences are occasional consequences of this technology due to software limitations. Not all errors are caught or corrected. If there are questions or concerns about the content of this note or information contained within the body of this dictation they  should be addressed directly with the author for clarification.        Data Review     Nursing records reviewed and agree: Yes    Pulse Oximetry Analysis - Normal  Laboratory results reviewed by EDP: Yes  Radiologic study results reviewed by EDP: Yes    Rendering Provider: Marjorie Smolder, MD        Critical Care             Clinical Impression & Disposition     Clinical Impression:  1. Cyclic vomiting syndrome    2. Dehydration        Disposition  ED Disposition     ED Disposition Condition Date/Time Comment    Discharge  Wed Jul 30, 2020  2:26 PM Breanda Greenlaw discharge to home/self care.    Condition at disposition: Stable          Prescriptions    New Prescriptions    No medications on file                Clovis Riley, MD  07/30/20 1427

## 2020-07-30 NOTE — EDIE (Signed)
COLLECTIVE?NOTIFICATION?07/30/2020 11:59?Bridget [name], Bridget [name]?MRN: [id number]    Criteria Met      5 ED Visits in 12 Months    Security and Safety  No recent Security Events currently on file    ED Care Guidelines  There are currently no ED Care Guidelines for this patient. Please check your facility's medical records system.    Flags      Negative COVID-19 Lab Result - [facility] - [name] specimen collected from this patient was negative for COVID-19 / Attributed By: [facility] [facility] / Attributed On: 07/26/2020       Prescription Monitoring Program  000??- Narcotic Use Score  000??- Sedative Use Score  000??- Stimulant Use Score  000??- Overdose Risk Score  - All Scores range from 000-999 with 75% of the population scoring < 200 and on 1% scoring above 650  - The last digit of the narcotic, sedative, and stimulant score indicates the number of active prescriptions of that type  - Higher Use scores correlate with increased prescribers, pharmacies, mg equiv, and overlapping prescriptions  - Higher Overdose Risk Scores correlate with increased risk of unintentional overdose death   Concerning or unexpectedly high scores should prompt [name] review of the PMP record; this does not constitute checking PMP for prescribing purposes.      E.D. Visit Count (12 mo.)  Facility Visits   [facility] 5   [place] [facility]: [facility]) 1   [facility] 2   Total 8   Note: Visits indicate total known visits.     Recent Emergency Department Visit Summary  Date Facility [facility] Type Diagnoses or Chief Complaint   Jul 30, 2020 [facility] [place] [facility] [place] [facility]      Nausea; Vomitting      Apr 06, 2020 [facility] [place] [facility] [place] [facility]      N/V;abd pain      Abdominal Pain      Dehydration      Cyclical vomiting syndrome unrelated to migraine      Apr 04, 2020 [place] [facility]: [facility] [place] [place] [facility]      weakness; not able to keep food or liquid down      Emesis       Abdominal Pain      Acidosis      Other chronic pain      Elevated blood-pressure reading, without diagnosis of hypertension      Cyclical vomiting syndrome unrelated to migraine      Generalized abdominal pain      Nausea with vomiting, unspecified      Other disorders of electrolyte and fluid balance, not elsewhere classified      Apr 02, 2020 [facility] [place] [facility] [place] [facility]      ABD PAIN- emesis      Abdominal Pain      Cyclical vomiting syndrome unrelated to migraine      Apr 01, 2020 [facility] [place] [facility]      Emesis      Nausea      Cyclical vomiting syndrome unrelated to migraine      Mar 31, 2020 [place] [facility] [place] [facility] [place] [facility]      Triage      Abdominal Pain      Emesis      Cyclical vomiting syndrome unrelated to migraine      Feb 05, 2020 [facility] [place] [facility]      emesis;weakness      Nausea      Nausea with vomiting, unspecified      Generalized abdominal pain      Jan 30, 2020 [place] [facility] [place] [facility] [place] [facility]      triage: vomiting, abdominal pain      Emesis      Nausea with vomiting, unspecified          Recent Inpatient Visit Summary  Date Facility [facility] Type Diagnoses or Chief Complaint   Jan 30, 2020 [place] [facility] [place] [facility]      Nausea with vomiting, unspecified          Care Team  Provider Specialty Phone Fax Service Dates   Wynne Dust, MD Pediatrics [phone] Current      Collective Portal  This patient has registered at the [facility] Emergency Department   For more information visit: [email or web address]     PLEASE NOTE:     1.   Any care recommendations and other clinical information are provided as guidelines or for historical purposes only, and providers should exercise their own clinical judgment when providing care.    2.   You may only use this information for purposes of treatment, payment or  health care operations activities, and subject to the limitations of applicable Collective Policies.    3.   You should consult directly with the organization that provided [name] care guideline or other clinical history with any questions about additional information or accuracy or completeness of information provided.    ? 2021 [place], [employer]. - [email or web address]

## 2020-07-30 NOTE — Discharge Instructions (Signed)
Dear  Bridget [name]:    I appreciate your choosing the [facility] for your healthcare needs, and hope your visit today was EXCELLENT.    Instructions:  Please follow-up with your specialists at [facility].  You may also of course follow-up with your primary care doctor.    Return to the Emergency Department for any worsening symptoms or concerns.    Below is some information that our patients often find helpful.    We wish you good health and please do not hesitate to contact us if we can ever be of any assistance.    Sincerely,  Clovis Riley, MD  [facility]    ________________________________________________________________    If you do not continue to improve or your condition worsens, please contact your doctor or return immediately to the Emergency Department.    Thank you for choosing [facility] for your emergency care needs.  We strive to provide EXCELLENT care to you and your family.      DOCTOR REFERRALS  Call [phone] if you need any further referrals and we can help you find a primary care doctor or specialist.  Also, available online at:  [email or web address]    YOUR CONTACT INFORMATION  Before leaving please check with registration to make sure we have an up-to-date contact number.  You can call registration at [phone] to update your information.  For questions about your hospital bill, please call [phone].  For questions about your Emergency Dept Physician bill please call [phone].      FREE HEALTH SERVICES  If you need help with health or social services, please call 2-1-1 for a free referral to resources in your area.  2-1-1 is a free service connecting people with information on health insurance, free clinics, pregnancy, mental health, dental care, food assistance, housing, and substance abuse counseling.  Also, available online at:  http://[email or web address].org    MEDICAL RECORDS AND  TESTS  Certain laboratory test results do not come back the same day, for example urine cultures.   We will contact you if other important findings are noted.  Radiology films are often reviewed again to ensure accuracy.  If there is any discrepancy, we will notify you.      Please call [phone] to pick up a complimentary CD of any radiology studies performed.  If you or your doctor would like to request a copy of your medical records, please call [phone].      ORTHOPEDIC INJURY   Please know that significant injuries can exist even when an initial x-ray is read as normal or negative.  This can occur because some fractures (broken bones) are not initially visible on x-rays.  For this reason, close outpatient follow-up with your primary care doctor or bone specialist (orthopedist) is required.    MEDICATIONS AND FOLLOWUP  Please be aware that some prescription medications can cause drowsiness.  Use caution when driving or operating machinery.    The examination and treatment you have received in our Emergency Department is provided on an emergency basis, and is not intended to be a substitute for your primary care physician.  It is important that your doctor checks you again and that you report any new or remaining problems at that time.      24 HOUR PHARMACIES  [facility] - [address], [place], [place] [address] (1.4 miles, 7 minutes)  [facility] - [address], [place], [place]  [address] (6.5 miles, 13 minutes)  Handout with directions available on request

## 2020-07-31 ENCOUNTER — Emergency Department
Admission: EM | Admit: 2020-07-31 | Discharge: 2020-07-31 | Disposition: A | Discharge status: Home / Self Care | Payer: No Typology Code available for payment source | Source: Home / Self Care | Attending: Emergency Medicine | Admitting: Emergency Medicine

## 2020-07-31 DIAGNOSIS — R112 Nausea with vomiting, unspecified: Secondary | ICD-10-CM | POA: Insufficient documentation

## 2020-07-31 LAB — COMPREHENSIVE METABOLIC PANEL
ALT: 18 U/L (ref 0–55)
AST (SGOT): 18 U/L (ref 5–34)
Albumin/Globulin Ratio: 1.6 (ref 0.9–2.2)
Albumin: 4.8 g/dL (ref 3.5–5.0)
Alkaline Phosphatase: 59 U/L (ref 37–106)
Anion Gap: 13 (ref 5.0–15.0)
BUN: 12 mg/dL (ref 7–19)
Bilirubin, Total: 0.9 mg/dL (ref 0.2–1.2)
CO2: 23 mEq/L (ref 22–29)
Calcium: 10.3 mg/dL (ref 8.5–10.5)
Chloride: 103 mEq/L (ref 100–111)
Creatinine: 0.8 mg/dL (ref 0.6–1.0)
Globulin: 3 g/dL (ref 2.0–3.6)
Glucose: 93 mg/dL (ref 70–100)
Potassium: 3.6 mEq/L (ref 3.5–5.1)
Protein, Total: 7.8 g/dL (ref 6.0–8.3)
Sodium: 139 mEq/L (ref 136–145)

## 2020-07-31 LAB — URINALYSIS REFLEX TO MICROSCOPIC EXAM - REFLEX TO CULTURE
Bilirubin, UA: NEGATIVE
Blood, UA: NEGATIVE
Glucose, UA: NEGATIVE
Ketones UA: 80 — AB
Nitrite, UA: NEGATIVE
Protein, UR: NEGATIVE
Specific Gravity UA: 1.019 (ref 1.001–1.035)
Urine pH: 6 (ref 5.0–8.0)
Urobilinogen, UA: NORMAL mg/dL (ref 0.2–2.0)

## 2020-07-31 LAB — CBC AND DIFFERENTIAL
Absolute NRBC: 0 10*3/uL (ref 0.00–0.00)
Basophils Absolute Automated: 0.07 10*3/uL (ref 0.00–0.08)
Basophils Automated: 0.6 %
Eosinophils Absolute Automated: 0 10*3/uL (ref 0.00–0.44)
Eosinophils Automated: 0 %
Hematocrit: 44.9 % — ABNORMAL HIGH (ref 34.7–43.7)
Hgb: 15 g/dL — ABNORMAL HIGH (ref 11.4–14.8)
Immature Granulocytes Absolute: 0.04 10*3/uL (ref 0.00–0.07)
Immature Granulocytes: 0.3 %
Lymphocytes Absolute Automated: 2.22 10*3/uL (ref 0.42–3.22)
Lymphocytes Automated: 19.2 %
MCH: 29.9 pg (ref 25.1–33.5)
MCHC: 33.4 g/dL (ref 31.5–35.8)
MCV: 89.6 fL (ref 78.0–96.0)
MPV: 9.5 fL (ref 8.9–12.5)
Monocytes Absolute Automated: 0.76 10*3/uL (ref 0.21–0.85)
Monocytes: 6.6 %
Neutrophils Absolute: 8.46 10*3/uL — ABNORMAL HIGH (ref 1.10–6.33)
Neutrophils: 73.3 %
Nucleated RBC: 0 /100 WBC (ref 0.0–0.0)
Platelets: 349 10*3/uL — ABNORMAL HIGH (ref 142–346)
RBC: 5.01 10*6/uL (ref 3.90–5.10)
RDW: 13 % (ref 11–15)
WBC: 11.55 10*3/uL — ABNORMAL HIGH (ref 3.10–9.50)

## 2020-07-31 LAB — LIPASE: Lipase: 25 U/L (ref 8–78)

## 2020-07-31 LAB — URINE HCG QUALITATIVE: Urine HCG Qualitative: NEGATIVE

## 2020-07-31 LAB — GFR: EGFR: >60

## 2020-07-31 MED ORDER — FAMOTIDINE 10 MG/ML IV SOLN (WRAP)
20.0000 mg | Freq: Once | INTRAVENOUS | Status: AC
Start: 2020-07-31 — End: 2020-07-31
  Administered 2020-07-31: 15:00:00 20 mg via INTRAVENOUS
  Filled 2020-07-31: qty 2

## 2020-07-31 MED ORDER — DIPHENHYDRAMINE HCL 50 MG/ML IJ SOLN
25.0000 mg | Freq: Once | INTRAMUSCULAR | Status: AC
Start: 2020-07-31 — End: 2020-07-31
  Administered 2020-07-31: 15:00:00 25 mg via INTRAVENOUS
  Filled 2020-07-31: qty 1

## 2020-07-31 MED ORDER — SODIUM CHLORIDE 0.9 % IV SOLN
12.5000 mg | Freq: Once | INTRAVENOUS | Status: AC
Start: 2020-07-31 — End: 2020-07-31
  Administered 2020-07-31: 18:00:00 12.5 mg via INTRAVENOUS
  Filled 2020-07-31: qty 1

## 2020-07-31 MED ORDER — PROCHLORPERAZINE EDISYLATE 10 MG/2ML IJ SOLN
10.0000 mg | Freq: Once | INTRAMUSCULAR | Status: AC
Start: 2020-07-31 — End: 2020-07-31
  Administered 2020-07-31: 16:00:00 10 mg via INTRAVENOUS
  Filled 2020-07-31: qty 2

## 2020-07-31 MED ORDER — SODIUM CHLORIDE 0.9 % IV SOLN
12.5000 mg | Freq: Once | INTRAVENOUS | Status: AC
Start: 2020-07-31 — End: 2020-07-31
  Administered 2020-07-31: 15:00:00 12.5 mg via INTRAVENOUS
  Filled 2020-07-31: qty 1

## 2020-07-31 MED ORDER — LORAZEPAM 2 MG/ML IJ SOLN
0.5000 mg | Freq: Once | INTRAMUSCULAR | Status: AC
Start: 2020-07-31 — End: 2020-07-31
  Administered 2020-07-31: 16:00:00 0.5 mg via INTRAVENOUS
  Filled 2020-07-31: qty 1

## 2020-07-31 MED ORDER — SODIUM CHLORIDE 0.9 % IV BOLUS
2000.0000 mL | Freq: Once | INTRAVENOUS | Status: AC
Start: 2020-07-31 — End: 2020-07-31
  Administered 2020-07-31: 15:00:00 2000 mL via INTRAVENOUS

## 2020-07-31 NOTE — ED Triage Notes (Signed)
Pt states last Marijuana use Sunday and has been vomiting and unable to stop.  Pt seen yesterday for same.

## 2020-07-31 NOTE — ED Provider Notes (Signed)
[facility] EMERGENCY DEPARTMENT HISTORY AND PHYSICAL EXAM    Patient Name: Bridget [name], Bridget [name]  Encounter Date:  07/31/2020  Rendering Provider: Francetta Found, MD  Patient DOB:  1996/05/12  MRN:  [id number]    History of Presenting Illness     Chief Complaint:   Chief Complaint   Patient presents with   . Emesis       Historian: Patient, patient's sister    The patient Bridget [name], is a 24 y.o. female with past medical history of Ehlers-Danlos, pots, cyclical vomiting syndrome who presents with epigastric pain, nausea, vomiting.  Symptoms present now for about 2 days, improved after she was seen here in the ED yesterday and received symptomatic management but returned this morning.  Patient states that she has significant pain in her epigastric area as well as around her navel.  She has associated severe nausea with multiple episodes of nonbloody, nonbilious emesis.  She denies fevers, chills, chest pain, shortness of breath, diarrhea.  Symptoms are identical to multiple previous episodes of suspected cyclical vomiting syndrome.  Patient has been evaluated by gastroenterology with suspicion of possible gastroparesis.  Cannabinoid hyperemesis has also been on the differential though patient states last marijuana intake was Sunday.  She takes erythromycin daily, Compazine and Phenergan suppository as needed for severe symptoms.  She states that when she does have episodes where today the medications that help Phenergan, IV Benadryl, IV famotidine.    Previous work-up includes normal CT scans, ultrasounds.  Patient denies possibility of pregnancy, takes OCPs and skips the placebo week so that she does not have cycles.  She denies urinary symptoms.    PMD:  Reynold Bowen, MD    Past Medical History     Past Medical History:   Diagnosis Date   . Abdominal pain    . Asthma     mild seasonal controlled   . Complication of anesthesia    . Cyclical vomiting    . Ehlers-Danlos disease    . Gastroparesis    .  Post-operative nausea and vomiting    . POTS (postural orthostatic tachycardia syndrome)     [facility]/stable for many yrs   . Sinus trouble     seasonal   . UTI (urinary tract infection)    . Yeast infection involving the vagina and surrounding area 05/09/2015       Past Surgical History     Past Surgical History:   Procedure Laterality Date   . ADENOIDECTOMY     . EGD N/A 01/23/2018    Procedure: EGD;  Surgeon: Jacqulyn Cane, MD;  Location: [facility];  Service: Gastroenterology;  Laterality: N/A;  EGD  Q1=N/A   . TONSILLECTOMY     . WISDOM TOOTH EXTRACTION         Family History     Family History   Problem Relation Age of Onset   . Cancer Mother    . Ehlers-Danlos syndrome Sister    . Asthma Brother        Social History     Social History     Socioeconomic History   . Marital status: Single     Spouse name: Not on file   . Number of children: Not on file   . Years of education: Not on file   . Highest education level: Not on file   Occupational History   . Not on file   Tobacco Use   . Smoking  status: Never Smoker   . Smokeless tobacco: Never Used   . Tobacco comment: the patient vapes nicotine    Vaping Use   . Vaping Use: Every day   . Start date: 04/14/2018   Substance and Sexual Activity   . Alcohol use: Not Currently   . Drug use: Yes     Types: Marijuana     Comment: Pt. uses marijuana every day.   Marland Kitchen Sexual activity: Never     Comment: takes the Pill continuously   Other Topics Concern   . Poor school performance No   . International travel in past 12 mos No   Social History Narrative   . Not on file     Social Determinants of Health     Financial Resource Strain: Low Risk    . Difficulty of Paying Living Expenses: Not hard at all   Food Insecurity: No Food Insecurity   . Worried About Programme researcher, broadcasting/film/video in the Last Year: Never true   . Ran Out of Food in the Last Year: Never true   Transportation Needs: No Transportation Needs   . Lack of Transportation  (Medical): No   . Lack of Transportation (Non-Medical): No   Physical Activity:    . Days of Exercise per Week: Not on file   . Minutes of Exercise per Session: Not on file   Stress:    . Feeling of Stress : Not on file   Social Connections:    . Frequency of Communication with Friends and Family: Not on file   . Frequency of Social Gatherings with Friends and Family: Not on file   . Attends Religious Services: Not on file   . Active Member of Clubs or Organizations: Not on file   . Attends Banker Meetings: Not on file   . Marital Status: Not on file   Intimate Partner Violence:    . Fear of Current or Ex-Partner: Not on file   . Emotionally Abused: Not on file   . Physically Abused: Not on file   . Sexually Abused: Not on file   Housing Stability:    . Unable to Pay for Housing in the Last Year: Not on file   . Number of Places Lived in the Last Year: Not on file   . Unstable Housing in the Last Year: Not on file       Home Medications     Home medications reviewed by ED MD.     Previous Medications    CARAWAY OIL-LEVOMENTHOL (FDGARD PO)    Take by mouth    CLONIDINE (CATAPRES) 0.1 MG TABLET    Take 0.05 mg by mouth nightly    DIPHENHYDRAMINE (BENADRYL) 50 MG TABLET    Take 50 mg by mouth nightly as needed    ERYTHROMYCIN 250 MG TABLET DELAYED RESPONSE    1 tab Orally Four times a day 14 days    IMIPRAMINE (TOFRANIL) 10 MG TABLET        METOCLOPRAMIDE (REGLAN) 10 MG TABLET    Take 1 tablet (10 mg total) by mouth 3 (three) times daily as needed (nausea/vomiting)    NITROFURANTOIN, MACROCRYSTAL-MONOHYDRATE, (MACROBID) 100 MG CAPSULE    Take 1 tab by mouth as a single dose taken within 2 hours of sexual intercourse    NORETHINDRONE-ETHINYL ESTRADIOL (ORTHO-NOVUM 1/35) 1-35 MG-MCG PER TABLET    Take 1 tablet by mouth daily    OMEPRAZOLE (PRILOSEC) 40 MG CAPSULE  daily       ONDANSETRON (ZOFRAN-ODT) 4 MG DISINTEGRATING TABLET    Take 1 tablet (4 mg total) by mouth every 6 (six) hours as needed for Nausea     PROCHLORPERAZINE (COMPAZINE) 5 MG TABLET    Take 1 tablet (5 mg total) by mouth every 6 (six) hours as needed for Nausea    PYRIDOSTIGMINE (MESTINON) 60 MG TABLET    Take 60 mg by mouth 2 (two) times daily       Review of Systems     Review of Systems   Constitutional: Negative for chills and fever.   Respiratory: Negative for shortness of breath.    Cardiovascular: Negative for chest pain.   Gastrointestinal: Positive for abdominal pain, nausea and vomiting. Negative for diarrhea.   Genitourinary: Negative for dysuria and vaginal bleeding.   All other systems reviewed and are negative.      Physical Exam     BP 144/83   Pulse 85   Temp 98.4 F (36.9 C) (Oral)   Resp 14   Wt 46.6 kg   SpO2 97%   BMI 17.63 kg/m     Physical Exam  Constitutional:       Appearance: She is well-developed. She is not diaphoretic.      Comments: Appears uncomfortable, holding emesis bag  Thin, but not cachectic   HENT:      Head: Normocephalic and atraumatic.   Eyes:      Conjunctiva/sclera: Conjunctivae normal.   Cardiovascular:      Rate and Rhythm: Normal rate and regular rhythm.      Heart sounds: Normal heart sounds.   Pulmonary:      Effort: Pulmonary effort is normal.      Breath sounds: Normal breath sounds.   Abdominal:      General: There is no distension.      Palpations: Abdomen is soft.      Tenderness: There is abdominal tenderness ( Tenderness to palpation in the epigastric area and periumbilical region, negative Murphy sign, no right lower quadrant tenderness).   Musculoskeletal:         General: No deformity.      Cervical back: Normal range of motion and neck supple.   Skin:     General: Skin is warm and dry.   Neurological:      Mental Status: She is alert and oriented to person, place, and time.   Psychiatric:         Behavior: Behavior normal.           ED Medications Administered     ED Medication Orders (From admission, onward)    Start Ordered     Status Ordering Provider    07/31/20 1720 07/31/20 1720   promethazine (PHENERGAN) 12.5 mg in sodium chloride 0.9 % 50 mL IVPB  Once        Route: Intravenous  Ordered Dose: 12.5 mg     Last MAR action: Given Francetta Found C    07/31/20 1557 07/31/20 1556  LORazepam (ATIVAN) injection 0.5 mg  Once        Route: Intravenous  Ordered Dose: 0.5 mg     Last MAR action: Given Francetta Found C    07/31/20 1557 07/31/20 1556  prochlorperazine (COMPAZINE) injection 10 mg  Once        Route: Intravenous  Ordered Dose: 10 mg     Last MAR action: Given Francetta Found C    07/31/20 1438 07/31/20 1438  sodium chloride 0.9 % bolus 2,000 mL  Once        Route: Intravenous  Ordered Dose: 2,000 mL     Last MAR action: New Bag Malen Gauze, [facility] C    07/31/20 1438 07/31/20 1438  promethazine (PHENERGAN) 12.5 mg in sodium chloride 0.9 % 50 mL IVPB  Once        Route: Intravenous  Ordered Dose: 12.5 mg     Last MAR action: Given Francetta Found C    07/31/20 1438 07/31/20 1438  diphenhydrAMINE (BENADRYL) injection 25 mg  Once        Route: Intravenous  Ordered Dose: 25 mg     Last MAR action: Given Francetta Found C    07/31/20 1438 07/31/20 1438  famotidine (PEPCID) injection 20 mg  Once        Route: Intravenous  Ordered Dose: 20 mg     Last MAR action: Given Kineta Fudala C          Orders Placed During This Encounter     Orders Placed This Encounter   Procedures   . Comprehensive metabolic panel   . CBC and differential   . UA Reflex to Micro - Reflex to Culture   . Urine HCG, Qualitative   . GFR   . Lipase         Diagnostic Study Results       The results of the diagnostic studies below were reviewed by the ED provider:    Labs  Results     Procedure Component Value Units Date/Time    UA Reflex to Micro - Reflex to Culture [[id number]]  (Abnormal) Collected: 07/31/20 1540     Updated: 07/31/20 1550     Urine Type Urine, Clean Ca     Color, UA Yellow     Clarity, UA Clear     Specific Gravity UA 1.019     Urine pH 6.0     Leukocyte Esterase, UA Trace     Nitrite, UA Negative     Protein, UR Negative      Glucose, UA Negative     Ketones UA 80     Urobilinogen, UA Normal mg/dL      Bilirubin, UA Negative     Blood, UA Negative     RBC, UA 0 - 2 /hpf      WBC, UA 0 - 5 /hpf      Squamous Epithelial Cells, Urine 0 - 5 /hpf     Urine HCG, Qualitative [[id number]] Collected: 07/31/20 1540    Specimen: Urine Updated: 07/31/20 1550     Urine HCG Qualitative Negative    Lipase [[id number]] Collected: 07/31/20 1431     Updated: 07/31/20 1526     Lipase 25 U/L     GFR [[id number]] Collected: 07/31/20 1431     Updated: 07/31/20 1505     EGFR >60.0    Comprehensive metabolic panel [[id number]] Collected: 07/31/20 1431    Specimen: Blood Updated: 07/31/20 1505     Glucose 93 mg/dL      BUN 12 mg/dL      Creatinine 0.8 mg/dL      Sodium 956 mEq/L      Potassium 3.6 mEq/L      Chloride 103 mEq/L      CO2 23 mEq/L      Calcium 10.3 mg/dL      Protein, Total 7.8 g/dL      Albumin 4.8 g/dL      AST (  SGOT) 18 U/L      ALT 18 U/L      Alkaline Phosphatase 59 U/L      Bilirubin, Total 0.9 mg/dL      Globulin 3.0 g/dL      Albumin/Globulin Ratio 1.6     Anion Gap 13.0    CBC and differential [[id number]]  (Abnormal) Collected: 07/31/20 1431    Specimen: Blood Updated: 07/31/20 1441     WBC 11.55 x10 3/uL      Hgb 15.0 g/dL      Hematocrit 47.8 %      Platelets 349 x10 3/uL      RBC 5.01 x10 6/uL      MCV 89.6 fL      MCH 29.9 pg      MCHC 33.4 g/dL      RDW 13 %      MPV 9.5 fL      Neutrophils 73.3 %      Lymphocytes Automated 19.2 %      Monocytes 6.6 %      Eosinophils Automated 0.0 %      Basophils Automated 0.6 %      Immature Granulocytes 0.3 %      Nucleated RBC 0.0 /100 WBC      Neutrophils Absolute 8.46 x10 3/uL      Lymphocytes Absolute Automated 2.22 x10 3/uL      Monocytes Absolute Automated 0.76 x10 3/uL      Eosinophils Absolute Automated 0.00 x10 3/uL      Basophils Absolute Automated 0.07 x10 3/uL      Immature Granulocytes Absolute 0.04 x10 3/uL      Absolute NRBC 0.00 x10 3/uL           Radiologic Studies  Radiology  Results (24 Hour)     ** No results found for the last 24 hours. **            MDM and Clinical Notes       Notes:  This is a 24 y.o. female who presents with nausea, vomiting, epigastric pain.  Symptoms similar to multiple previous episodes of cyclical vomiting syndrome.  Exam not consistent with acute cholecystitis or biliary colic, appendicitis, diverticulitis.  Pancreatitis could potentially be on the differential so lipase ordered.  Patient has had extensive work-up previously for these symptoms and has no significant differences today.  Will treat symptomatically, as patient has experience with the symptoms we will try her requested cocktail first.  We discussed Haldol the patient states she has had a poor reaction to that in the past.  We did discuss Ativan which could be a backup option should the initial cocktail not work well.  I am also checking basic electrolytes and urinalysis for signs of significant dehydration or other abnormalities.      ED Course and Procedures     ED Course as of 07/31/20 1846   Thu Jul 31, 2020   1554 Work-up consistent with dehydration (ketonuria) but no other acute abnormalities.  Patient had mild but not significant improvement with initial treatment plan.  We will add low-dose Ativan as well as Compazine. [SF]   1720 Patient feeling slightly improved but continues to have symptoms.  Will try 1 final round of Phenergan and then p.o. challenge. [SF]   1845 Patient tolerating oral intake.  Agreeable with plan for discharge home with close outpatient follow-up with primary care. [SF]      ED Course User Index  [SF] Malen Gauze,  Eleonore Chiquito, MD       Procedures    Diagnosis and Disposition     Clinical Impression  1. Non-intractable vomiting with nausea, unspecified vomiting type        Disposition  ED Disposition     ED Disposition Condition Date/Time Comment    Discharge  Thu Jul 31, 2020  6:45 PM Bridget [name] discharge to home/self care.    Condition at disposition: Stable             Prescriptions       New Prescriptions    No medications on file             Rendering Provider: Francetta Found, MD         Darnelle Maffucci, MD  07/31/20 [phone]

## 2020-07-31 NOTE — ED Notes (Signed)
Pt c/o rash on right thigh,not itching, Dr Malen Gauze notified. No orders given. VS taken.

## 2020-07-31 NOTE — EDIE (Signed)
COLLECTIVE?NOTIFICATION?07/31/2020 13:51?Bridget [name], Bridget [name]?MRN: [id number]    Criteria Met      5 ED Visits in 12 Months    Security and Safety  No recent Security Events currently on file    ED Care Guidelines  There are currently no ED Care Guidelines for this patient. Please check your facility's medical records system.    Flags      Negative COVID-19 Lab Result - [facility] - [name] specimen collected from this patient was negative for COVID-19 / Attributed By: [place] Department of Health / Attributed On: 07/26/2020       Prescription Monitoring Program  000??- Narcotic Use Score  000??- Sedative Use Score  000??- Stimulant Use Score  000??- Overdose Risk Score  - All Scores range from 000-999 with 75% of the population scoring < 200 and on 1% scoring above 650  - The last digit of the narcotic, sedative, and stimulant score indicates the number of active prescriptions of that type  - Higher Use scores correlate with increased prescribers, pharmacies, mg equiv, and overlapping prescriptions  - Higher Overdose Risk Scores correlate with increased risk of unintentional overdose death   Concerning or unexpectedly high scores should prompt [name] review of the PMP record; this does not constitute checking PMP for prescribing purposes.      E.D. Visit Count (12 mo.)  Facility Visits   [place] [facility] 6   [place] [facility]: [facility]) 1   [place] 2   Total 9   Note: Visits indicate total known visits.     Recent Emergency Department Visit Summary  Date Facility [facility] Type Diagnoses or Chief Complaint   Jul 31, 2020 [facility] [place] [facility] [place] [facility]      Emesis; Nausea      Jul 30, 2020 [place] [facility] [employer] [facility] [place] [facility]      Nausea; Vomitting      Emesis      Cyclical vomiting syndrome unrelated to migraine      Dehydration      Apr 06, 2020 [facility] [place] [facility] [place] [facility]      N/V;abd pain      Abdominal Pain      Dehydration      Cyclical vomiting syndrome unrelated to  migraine      Apr 04, 2020 [place] [facility]: [facility] [place] [place] [facility]      weakness; not able to keep food or liquid down      Emesis      Abdominal Pain      Acidosis      Other chronic pain      Elevated blood-pressure reading, without diagnosis of hypertension      Cyclical vomiting syndrome unrelated to migraine      Generalized abdominal pain      Nausea with vomiting, unspecified      Other disorders of electrolyte and fluid balance, not elsewhere classified      Apr 02, 2020 [facility] [place] [facility] [place] [facility]      ABD PAIN- emesis      Abdominal Pain      Cyclical vomiting syndrome unrelated to migraine      Apr 01, 2020 [facility] [place] [facility]      Emesis      Nausea      Cyclical vomiting syndrome unrelated to migraine      Mar 31, 2020  [facility] [place] [facility] [place] [facility]      Triage      Abdominal Pain      Emesis      Cyclical vomiting syndrome unrelated to migraine      Feb 05, 2020 [facility] [place] [facility] [place] [facility]      emesis;weakness      Nausea      Nausea with vomiting, unspecified      Generalized abdominal pain      Jan 30, 2020 [place] [facility] [place] [facility] [place] [place] [facility]      triage: vomiting, abdominal pain      Emesis      Nausea with vomiting, unspecified          Recent Inpatient Visit Summary  Date Facility [facility] Type Diagnoses or Chief Complaint   Jan 30, 2020 [place] [facility] [place] [facility] [place]      Nausea with vomiting, unspecified          Care Team  Provider Specialty Phone Fax Service Dates   Wynne Dust, MD Pediatrics [phone] Current      Collective Portal  This patient has registered at the [facility] Emergency Department   For more information visit: [email or web address]     PLEASE NOTE:     1.   Any care recommendations and other clinical information are provided as guidelines or for historical purposes only, and  providers should exercise their own clinical judgment when providing care.    2.   You may only use this information for purposes of treatment, payment or health care operations activities, and subject to the limitations of applicable Collective Policies.    3.   You should consult directly with the organization that provided [name] care guideline or other clinical history with any questions about additional information or accuracy or completeness of information provided.    ? 2021 [place], [employer] - [email or web address]

## 2020-07-31 NOTE — Discharge Instructions (Signed)
Dear Bridget [name]:    I appreciate your choosing the [facility] for your healthcare needs, and hope your visit today was EXCELLENT.    Instructions:    Return to the Emergency Department for any worsening symptoms or concerns.    Below is some information that our patients often find helpful.    We wish you good health and please do not hesitate to contact us if we can ever be of any assistance.    Sincerely,  Darnelle Maffucci, MD  [facility]    ________________________________________________________________    IF YOU DO NOT CONTINUE TO IMPROVE OR YOUR CONDITION WORSENS, PLEASE CONTACT YOUR DOCTOR OR RETURN IMMEDIATELY TO THE EMERGENCY DEPARTMENT.    Thank you for choosing [facility] for your emergency care needs.  We strive to provide EXCELLENT care to you and your family.      DOCTOR REFERRALS  Call [phone] if you need any further referrals and we can help you find a primary care doctor or specialist.  Also, available online at:  [email or web address]    YOUR CONTACT INFORMATION  Before leaving please check with registration to make sure we have an up-to-date contact number.  You can call registration at [phone] to update your information.  For questions about your hospital bill, please call [phone].  For questions about your Emergency Dept Physician bill please call [phone].      FREE HEALTH SERVICES  If you need help with health or social services, please call 2-1-1 for a free referral to resources in your area.  2-1-1 is a free service connecting people with information on health insurance, free clinics, pregnancy, mental health, dental care, food assistance, housing, and substance abuse counseling.  Also, available online at:  http://[email or web address].org    MEDICAL RECORDS AND TESTS  Certain laboratory test results do not come back the same day, for example urine cultures.   We will contact you if  other important findings are noted.  Radiology films are often reviewed again to ensure accuracy.  If there is any discrepancy, we will notify you.      Please call [phone] to pick up a complimentary CD of any radiology studies performed.  If you or your doctor would like to request a copy of your medical records, please call [phone].      MEDICATIONS AND FOLLOWUP  Please be aware that some prescription medications can cause drowsiness.  Use caution when driving or operating machinery.    The examination and treatment you have received in our Emergency Department is provided on an emergency basis, and is not intended to be a substitute for your primary care physician.  It is important that your doctor checks you again and that you report any new or remaining problems at that time.      [address] [facility]  [facility] - [address], [place], [place] [address] (1.4 miles, 7 minutes)  [facility] - [address] [place], [place] [address] (6.5 miles, 13 minutes)  Handout with directions available on request    PATIENT RELATIONS  If you have any concerns, issues, or feedback related to your care, positive or negative, please do not hesitate to contact Patient Relations at [phone]. They are open from 8:30AM-5:00PM Monday through Friday.

## 2020-08-01 ENCOUNTER — Observation Stay
Admission: EM | Admit: 2020-08-01 | Discharge: 2020-08-02 | Disposition: A | Discharge status: Home / Self Care | Payer: No Typology Code available for payment source | Attending: Family Medicine | Admitting: Family Medicine

## 2020-08-01 DIAGNOSIS — J45909 Unspecified asthma, uncomplicated: Secondary | ICD-10-CM | POA: Insufficient documentation

## 2020-08-01 DIAGNOSIS — F1290 Cannabis use, unspecified, uncomplicated: Secondary | ICD-10-CM | POA: Insufficient documentation

## 2020-08-01 DIAGNOSIS — R112 Nausea with vomiting, unspecified: Principal | ICD-10-CM | POA: Insufficient documentation

## 2020-08-01 DIAGNOSIS — R1115 Cyclical vomiting syndrome unrelated to migraine: Secondary | ICD-10-CM

## 2020-08-01 DIAGNOSIS — Z23 Encounter for immunization: Secondary | ICD-10-CM | POA: Insufficient documentation

## 2020-08-01 LAB — CBC AND DIFFERENTIAL
Absolute NRBC: 0 10*3/uL (ref 0.00–0.00)
Basophils Absolute Automated: 0.07 10*3/uL (ref 0.00–0.08)
Basophils Automated: 0.7 %
Eosinophils Absolute Automated: 0 10*3/uL (ref 0.00–0.44)
Eosinophils Automated: 0 %
Hematocrit: 41.9 % (ref 34.7–43.7)
Hgb: 14.1 g/dL (ref 11.4–14.8)
Immature Granulocytes Absolute: 0.02 10*3/uL (ref 0.00–0.07)
Immature Granulocytes: 0.2 %
Lymphocytes Absolute Automated: 2.3 10*3/uL (ref 0.42–3.22)
Lymphocytes Automated: 24.5 %
MCH: 30 pg (ref 25.1–33.5)
MCHC: 33.7 g/dL (ref 31.5–35.8)
MCV: 89.1 fL (ref 78.0–96.0)
MPV: 9.5 fL (ref 8.9–12.5)
Monocytes Absolute Automated: 0.53 10*3/uL (ref 0.21–0.85)
Monocytes: 5.6 %
Neutrophils Absolute: 6.48 10*3/uL — ABNORMAL HIGH (ref 1.10–6.33)
Neutrophils: 69 %
Nucleated RBC: 0 /100 WBC (ref 0.0–0.0)
Platelets: 340 10*3/uL (ref 142–346)
RBC: 4.7 10*6/uL (ref 3.90–5.10)
RDW: 13 % (ref 11–15)
WBC: 9.4 10*3/uL (ref 3.10–9.50)

## 2020-08-01 LAB — COMPREHENSIVE METABOLIC PANEL
ALT: 17 U/L (ref 0–55)
AST (SGOT): 18 U/L (ref 5–34)
Albumin/Globulin Ratio: 1.6 (ref 0.9–2.2)
Albumin: 4.6 g/dL (ref 3.5–5.0)
Alkaline Phosphatase: 53 U/L (ref 37–106)
Anion Gap: 15 (ref 5.0–15.0)
BUN: 11 mg/dL (ref 7–19)
Bilirubin, Total: 0.9 mg/dL (ref 0.2–1.2)
CO2: 21 mEq/L — ABNORMAL LOW (ref 22–29)
Calcium: 9.8 mg/dL (ref 8.5–10.5)
Chloride: 101 mEq/L (ref 100–111)
Creatinine: 0.7 mg/dL (ref 0.6–1.0)
Globulin: 2.8 g/dL (ref 2.0–3.6)
Glucose: 73 mg/dL (ref 70–100)
Potassium: 3.6 mEq/L (ref 3.5–5.1)
Protein, Total: 7.4 g/dL (ref 6.0–8.3)
Sodium: 137 mEq/L (ref 136–145)

## 2020-08-01 LAB — HCG, SERUM, QUALITATIVE: Hcg Qualitative: NEGATIVE

## 2020-08-01 LAB — GFR: EGFR: >60

## 2020-08-01 LAB — LIPASE: Lipase: 20 U/L (ref 8–78)

## 2020-08-01 MED ORDER — ONDANSETRON 4 MG PO TBDP
4.0000 mg | ORAL_TABLET | Freq: Four times a day (QID) | ORAL | Status: DC | PRN
Start: 2020-08-01 — End: 2020-08-02
  Administered 2020-08-01 – 2020-08-02 (×2): 4 mg via ORAL
  Filled 2020-08-01 (×2): qty 1

## 2020-08-01 MED ORDER — LORAZEPAM 2 MG/ML IJ SOLN
1.0000 mg | Freq: Once | INTRAMUSCULAR | Status: AC
Start: 2020-08-01 — End: 2020-08-01
  Administered 2020-08-01: 17:00:00 1 mg via INTRAVENOUS
  Filled 2020-08-01: qty 1

## 2020-08-01 MED ORDER — SODIUM CHLORIDE 0.9 % IV BOLUS
1000.0000 mL | Freq: Once | INTRAVENOUS | Status: AC
Start: 2020-08-01 — End: 2020-08-01
  Administered 2020-08-01: 17:00:00 1000 mL via INTRAVENOUS

## 2020-08-01 MED ORDER — SODIUM CHLORIDE 0.9 % IV SOLN
INTRAVENOUS | Status: DC
Start: 2020-08-01 — End: 2020-08-02

## 2020-08-01 MED ORDER — GLUCOSE 40 % PO GEL
15.0000 g | ORAL | Status: DC | PRN
Start: 2020-08-01 — End: 2020-08-02

## 2020-08-01 MED ORDER — DEXTROSE 50 % IV SOLN
12.5000 g | INTRAVENOUS | Status: DC | PRN
Start: 2020-08-01 — End: 2020-08-02

## 2020-08-01 MED ORDER — SODIUM CHLORIDE 0.9 % IV SOLN
125.0000 mg | Freq: Three times a day (TID) | INTRAVENOUS | Status: DC
Start: 2020-08-01 — End: 2020-08-02
  Administered 2020-08-01 – 2020-08-02 (×2): 125 mg via INTRAVENOUS
  Filled 2020-08-01 (×6): qty 125

## 2020-08-01 MED ORDER — GLUCAGON 1 MG IJ SOLR (WRAP)
1.0000 mg | INTRAMUSCULAR | Status: DC | PRN
Start: 2020-08-01 — End: 2020-08-02

## 2020-08-01 MED ORDER — PROCHLORPERAZINE EDISYLATE 10 MG/2ML IJ SOLN
5.0000 mg | INTRAMUSCULAR | Status: DC | PRN
Start: 2020-08-01 — End: 2020-08-02
  Administered 2020-08-01 – 2020-08-02 (×2): 5 mg via INTRAVENOUS
  Filled 2020-08-01 (×2): qty 2

## 2020-08-01 MED ORDER — NALOXONE HCL 0.4 MG/ML IJ SOLN (WRAP)
0.2000 mg | INTRAMUSCULAR | Status: DC | PRN
Start: 2020-08-01 — End: 2020-08-02

## 2020-08-01 MED ORDER — SODIUM CHLORIDE 0.9 % IV SOLN
12.5000 mg | Freq: Four times a day (QID) | INTRAVENOUS | Status: DC | PRN
Start: 2020-08-01 — End: 2020-08-02
  Administered 2020-08-02 (×2): 12.5 mg via INTRAVENOUS
  Filled 2020-08-01 (×2): qty 1

## 2020-08-01 MED ORDER — SODIUM CHLORIDE 0.9 % IV SOLN
12.5000 mg | Freq: Once | INTRAVENOUS | Status: AC
Start: 2020-08-01 — End: 2020-08-01
  Administered 2020-08-01: 20:00:00 12.5 mg via INTRAVENOUS
  Filled 2020-08-01: qty 1

## 2020-08-01 MED ORDER — SODIUM CHLORIDE 0.9 % IV SOLN
12.5000 mg | Freq: Once | INTRAVENOUS | Status: AC
Start: 2020-08-01 — End: 2020-08-01
  Administered 2020-08-01: 17:00:00 12.5 mg via INTRAVENOUS
  Filled 2020-08-01: qty 1

## 2020-08-01 MED ORDER — PANTOPRAZOLE SODIUM 40 MG PO TBEC
40.0000 mg | DELAYED_RELEASE_TABLET | Freq: Every morning | ORAL | Status: DC
Start: 2020-08-02 — End: 2020-08-02
  Administered 2020-08-02: 08:00:00 40 mg via ORAL
  Filled 2020-08-01: qty 1

## 2020-08-01 MED ORDER — INFLUENZA VAC SPLIT QUAD 0.5 ML IM SUSY
0.5000 mL | PREFILLED_SYRINGE | Freq: Once | INTRAMUSCULAR | Status: AC
Start: 2020-08-01 — End: 2020-08-02
  Administered 2020-08-02: 14:00:00 0.5 mL via INTRAMUSCULAR
  Filled 2020-08-01: qty 0.5

## 2020-08-01 NOTE — ED Notes (Signed)
[facility] EMERGENCY DEPARTMENT  ED NURSING NOTE FOR THE RECEIVING INPATIENT NURSE   ED NURSE [place] [phone]   ED CHARGE RN Maura   ADMISSION INFORMATION   Bridget [name] is a 24 y.o. female admitted with an ED diagnosis of:    1. Cyclical vomiting    2. Intractable vomiting with nausea, unspecified vomiting type         Isolation: None   Allergies: Haloperidol, Latex, Reglan [metoclopramide], and Morphine   Holding Orders confirmed? Yes   Belongings Documented? Yes   Home medications sent to pharmacy confirmed? N/A   NURSING CARE   Patient Comes From:   Mental Status: Home Independent  alert and oriented   ADL: Independent with all ADLs   Ambulation: no difficulty   Pertinent Information  and Safety Concerns: N/A     CT / NIH   CT Head ordered on this patient?  N/A   NIH/Dysphagia assessment done prior to admission? N/A   VITAL SIGNS (at the time of this note)      Vitals:    08/01/20 1848   BP: 140/81   Pulse: (!) 102   Resp: 14   Temp: 99.8 F (37.7 C)   SpO2: 100%

## 2020-08-01 NOTE — ED Provider Notes (Signed)
This patient has been signed out to me by Deborah Chalk, PA-C.  Awaiting [facility] resident (Dr. Dayton Scrape) to see patient.    Dr. Dayton Scrape w/ [facility] has seen the patient and will admit for observation.  Additional IV phenergan ordered for worsening nausea.  Pt clinically stable and admitted for observation.      Lucita Ferrara, PA  08/01/20 2231       Harden Mo, MD  08/02/20 1407

## 2020-08-01 NOTE — H&P (Signed)
[facility] Admission H&P  Physician Available 24 hours a day:  Please contact team using the phone number in the sticky note, or Epic chat the residents.  Office#: [phone]       Date Time: 08/01/20 11:09 PM  Patient Name: Bridget [name]  Attending Physician: Angelina Sheriff, MD  Primary Care Physician: Reynold Bowen, MD    CC: Cyclical vomiting       Assessment:     Active Hospital Problems    Diagnosis   . Cyclical vomiting   . Cyclic vomiting syndrome       24 y.o. female with a history of asthma, Ehlers-Danlos syndrome, POTS, cyclic vomiting syndrome admitted with intractable nausea and vomiting for IV hydration and antiemetics.     Plan:   #Intractable nausea and vomiting  Possibly 2/2 marijuana overuse  Gastroparesis may be contributing  -Prochlorperazine 5 mg IV q4hrs PRN  -Ondansetron 4 mg ODT q6hrs PRN  -Pantoprazole 40 mg PO in place of home Prilosec  -Trial erythromycin 125 mg (~3 mg/kg) q8hrs for gastroparesis  -EKG to assess QT  -IV fluids: NS at 75 mL/hr  -Clear liquid diet, advance as tolerated  -Avoid Reglan and Haldol as pt reports adverse reactions    #POTS  Stable.  Has been evaluated by Cardiology.    #FEN/GI : clear liquid diet; IV NS at 75 mL/hr    #PPX  - DVT: SCDs  - GI: home PPI equivalent    CODE STATUS: Full code    DISPO:  Based on the information available on the day of this admission, patient will be admitted to service status:  Observation: Due to Need for IV antiemetics due to cyclic vomiting syndrome; otherwise stable  Anticipated medical stability for discharge:Yellow - maybe tomorrow  Anticipated discharge needs: GI follow-up for gastroparesis work-up    History of Presenting Illness:   Bridget [name] is a 24 y.o. female with known EDS, POTS, cyclic vomiting who presents to the hospital with continued nausea and vomiting.     Patient returns with recurrence of nausea/vomiting after several ED visits this week for the same. Patient has been evaluated  for the same for the last several months with GI-- presumed gastroparesis vs cannabinoid hyperemesis.     Patient has been using daily erythromycin, compazine, omeprazole, phenergan at home without improvement. States she still is unable to keep any PO down.    Symptoms have been ongoing since August (5 months) though this recent flare has lasted since Thanksgiving (~4 weeks).  She has not re-gained weight she lost in August.    Reports she used marijuana recreationally in college and under medical supervision more recently.  She uses marijuana to stimulate her appetite.  She notices improvement in her nausea when she eats.  However she continues to have episodes of severe nausea.  She and family are hesitant to completely stop marijuana as they believe she might be benefiting from its appetite stimulating effect.  They worry the effect from not eating is worse than the marijuana.    ED course:  - 1L NS  - Ativan 1 mg x1  - Phenergan 12.5 mg x1     SPECIALISTS:   Dr Diamantina ProvidenceMarjory Sneddon   Recently initiated care with [facility]       Past Medical History:     Past Medical History:   Diagnosis Date   . Abdominal pain    . Asthma  mild seasonal controlled   . Complication of anesthesia    . Cyclical vomiting    . Ehlers-Danlos disease    . Gastroparesis    . Post-operative nausea and vomiting    . POTS (postural orthostatic tachycardia syndrome)     [facility]/stable for many yrs   . Sinus trouble     seasonal   . UTI (urinary tract infection)    . Yeast infection involving the vagina and surrounding area 05/09/2015       Past Surgical History:     Past Surgical History:   Procedure Laterality Date   . ADENOIDECTOMY     . EGD N/A 01/23/2018    Procedure: EGD;  Surgeon: Jacqulyn Cane, MD;  Location: [facility];  Service: Gastroenterology;  Laterality: N/A;  EGD  Q1=N/A   . TONSILLECTOMY     . WISDOM TOOTH EXTRACTION         Family History:      Family History   Problem Relation Age of Onset   . Cancer Mother    . Ehlers-Danlos syndrome Sister    . Asthma Brother        Social History:     Social History     Socioeconomic History   . Marital status: Single     Spouse name: Not on file   . Number of children: Not on file   . Years of education: Not on file   . Highest education level: Not on file   Occupational History   . Not on file   Tobacco Use   . Smoking status: Never Smoker   . Smokeless tobacco: Never Used   . Tobacco comment: the patient vapes nicotine    Vaping Use   . Vaping Use: Every day   . Start date: 04/14/2018   Substance and Sexual Activity   . Alcohol use: Not Currently   . Drug use: Yes     Types: Marijuana     Comment: Pt. uses marijuana every day.   Marland Kitchen Sexual activity: Never     Comment: takes the Pill continuously   Other Topics Concern   . Poor school performance No   . International travel in past 12 mos No   Social History Narrative   . Not on file     Social Determinants of Health     Financial Resource Strain: Low Risk    . Difficulty of Paying Living Expenses: Not hard at all   Food Insecurity: No Food Insecurity   . Worried About Programme researcher, broadcasting/film/video in the Last Year: Never true   . Ran Out of Food in the Last Year: Never true   Transportation Needs: No Transportation Needs   . Lack of Transportation (Medical): No   . Lack of Transportation (Non-Medical): No   Physical Activity:    . Days of Exercise per Week: Not on file   . Minutes of Exercise per Session: Not on file   Stress:    . Feeling of Stress : Not on file   Social Connections:    . Frequency of Communication with Friends and Family: Not on file   . Frequency of Social Gatherings with Friends and Family: Not on file   . Attends Religious Services: Not on file   . Active Member of Clubs or Organizations: Not on file   . Attends Banker Meetings: Not on file   . Marital Status: Not on file  Intimate Partner Violence:    . Fear of Current or Ex-Partner: Not  on file   . Emotionally Abused: Not on file   . Physically Abused: Not on file   . Sexually Abused: Not on file   Housing Stability:    . Unable to Pay for Housing in the Last Year: Not on file   . Number of Places Lived in the Last Year: Not on file   . Unstable Housing in the Last Year: Not on file       Allergies:     Allergies   Allergen Reactions   . Haloperidol      Interacts with reglan and also pt had involuntary eye movements.    . Latex Itching     Itching irritation swelling condoms  Itching irritation swelling condoms  Itching irritation swelling condoms   . Reglan [Metoclopramide]    . Morphine Rash       Medications:     Current Discharge Medication List      CONTINUE these medications which have NOT CHANGED    Details   Caraway Oil-Levomenthol (FDGARD PO) Take by mouth      cloNIDine (CATAPRES) 0.1 MG tablet Take 0.05 mg by mouth nightly      diphenhydrAMINE (BENADRYL) 50 MG tablet Take 50 mg by mouth nightly as needed      Erythromycin 250 MG Tablet Delayed Response 1 tab Orally Four times a day 14 days      norethindrone-ethinyl estradiol (ORTHO-NOVUM 1/35) 1-35 MG-MCG per tablet Take 1 tablet by mouth daily  Qty: 84 tablet, Refills: 3    Associated Diagnoses: Encounter for surveillance of contraceptive pills      omeprazole (PriLOSEC) 40 MG capsule daily         ondansetron (ZOFRAN-ODT) 4 MG disintegrating tablet Take 1 tablet (4 mg total) by mouth every 6 (six) hours as needed for Nausea  Qty: 12 tablet, Refills: 0      prochlorperazine (COMPAZINE) 5 MG tablet Take 1 tablet (5 mg total) by mouth every 6 (six) hours as needed for Nausea  Qty: 12 tablet, Refills: 0      pyridostigmine (MESTINON) 60 MG tablet Take 60 mg by mouth 2 (two) times daily              Review of Systems:   All other systems were reviewed and are negative except: per HPI    Physical Exam:     VITAL SIGNS: Physical Exam:   Temp:  [98.4 F (36.9 C)-99.8 F (37.7 C)] 98.8 F (37.1 C)  Heart Rate:  [91-109] 91  Resp Rate:   [14-17] 17  BP: (119-140)/(81-88) 130/86  Body mass index is 17.16 kg/m.    Intake/Output Summary (Last 24 hours) at 08/01/2020 2309  Last data filed at 08/01/2020 1823  Gross per 24 hour   Intake 1000 ml   Output -   Net 1000 ml    GEN: alert and oriented; NAD; appears nauseated  HEENT: PERRL, EOMI, sclera anicteric, oropharynx clear without lesions, MMM  NECK: supple, no lymphadenopathy, no thyromegaly, no JVD  CARDS: RRR, no murmurs, rubs or gallops  LUNGS: clear to auscultation bilaterally, without wheezing, rhonchi, or rales  ABD: aortic pulse easily heard given pt's thin habitus; tenderness present in the suprapubic area/lower abdomen  EXT: no clubbing, cyanosis, or edema  NEURO: cranial nerves grossly intact, strength 5/5 in upper and lower extremities, sensation intact  SKIN: no rashes or lesions noted  Labs Reviewed:     Results     Procedure Component Value Units Date/Time    Beta HCG, Qual, Serum [[id number]] Collected: 08/01/20 1536     Updated: 08/01/20 1759     Hcg Qualitative Negative    Comprehensive metabolic panel [[id number]]  (Abnormal) Collected: 08/01/20 1536    Specimen: Blood Updated: 08/01/20 1620     Glucose 73 mg/dL      BUN 11 mg/dL      Creatinine 0.7 mg/dL      Sodium 756 mEq/L      Potassium 3.6 mEq/L      Chloride 101 mEq/L      CO2 21 mEq/L      Calcium 9.8 mg/dL      Protein, Total 7.4 g/dL      Albumin 4.6 g/dL      AST (SGOT) 18 U/L      ALT 17 U/L      Alkaline Phosphatase 53 U/L      Bilirubin, Total 0.9 mg/dL      Globulin 2.8 g/dL      Albumin/Globulin Ratio 1.6     Anion Gap 15.0    Lipase [[id number]] Collected: 08/01/20 1536    Specimen: Blood Updated: 08/01/20 1620     Lipase 20 U/L     GFR [[id number]] Collected: 08/01/20 1536     Updated: 08/01/20 1620     EGFR >60.0    CBC and differential [[id number]]  (Abnormal) Collected: 08/01/20 1536    Specimen: Blood Updated: 08/01/20 1543     WBC 9.40 x10 3/uL      Hgb 14.1 g/dL      Hematocrit 57.3 %      Platelets 340 x10  3/uL      RBC 4.70 x10 6/uL      MCV 89.1 fL      MCH 30.0 pg      MCHC 33.7 g/dL      RDW 13 %      MPV 9.5 fL      Neutrophils 69.0 %      Lymphocytes Automated 24.5 %      Monocytes 5.6 %      Eosinophils Automated 0.0 %      Basophils Automated 0.7 %      Immature Granulocytes 0.2 %      Nucleated RBC 0.0 /100 WBC      Neutrophils Absolute 6.48 x10 3/uL      Lymphocytes Absolute Automated 2.30 x10 3/uL      Monocytes Absolute Automated 0.53 x10 3/uL      Eosinophils Absolute Automated 0.00 x10 3/uL      Basophils Absolute Automated 0.07 x10 3/uL      Immature Granulocytes Absolute 0.02 x10 3/uL      Absolute NRBC 0.00 x10 3/uL             Imaging Reviewed:     No results found.      Signed by: Bertram Denver, MD  [place]: [phone]  : [phone]  Office #: [phone]     GY:IRSWNI, Orma Render, MD

## 2020-08-01 NOTE — Progress Notes (Signed)
Admission Note-   Patient admitted to Medical#  [id number] at 2110. Hand off report received from ED note.     Patient oriented to room, bed in lowest position, bed alarm activated, call bell within reach.    Fall prevention contract completed. Yes/No   Patient informed of visitor policy.   Belongings at bedside .     Brief Clinical Picture:  Neuro AXO 4; Resp - WDl; ; GI- N/V    Skin Assessment  Two nurse skin assessment performed with: Stephanie    Areas observed with redness or injury: none     Braden score <15. No      Any significant admission event

## 2020-08-01 NOTE — ED Triage Notes (Signed)
Per pt mother, this is pt 3rd day here in ED for cyclic vomiting, back today because "unable to keep down food or water", denies fevers, AOx4.

## 2020-08-01 NOTE — ED Provider Notes (Signed)
[facility]       [facility]  EMERGENCY DEPARTMENT HISTORY AND PHYSICAL EXAM    Patient Information     Patient Name: Bridget [name], Bridget [name]  Encounter Date:  08/01/2020  Patient DOB:  11/20/1995  MRN:  [id number]  Room:  14/B14  Rendering Provider: Delice Bison A. Jeannetta Nap, PA-C, CAQ-EM    History of Presenting Illness     Chief Complaint: nausea, vomiting  Historian: pt, mother  Onset: gradual, Monday  Quality: non-bloody  Duration: x4 days      HPI Comments:   24 y.o. female h/o asthma, gastroparesis, cyclic vomiting, chronic marijuana use, ehlers-danlos, POTS c/o gradual onset of intermittent nausea and non-bloody vomiting x4 days. Assoc symptoms include mid upper abdominal pain. Normal BM today. No fever, sore throat, CP, cough, shortness of breath, hematemesis, diarrhea or urinary complaints. Pt has tried taking omeprazole and phenergan without much improvement. She states she's been seen in the ED 3x this week, but is still unable to keep anything down.      PMD: Reynold Bowen, MD   GI: [facility] - Dr. Diamantina Providence     Past Medical History     Past Medical History:   Diagnosis Date   . Abdominal pain    . Asthma     mild seasonal controlled   . Complication of anesthesia    . Cyclical vomiting    . Ehlers-Danlos disease    . Gastroparesis    . Post-operative nausea and vomiting    . POTS (postural orthostatic tachycardia syndrome)     [facility]/stable for many yrs   . Sinus trouble     seasonal   . UTI (urinary tract infection)    . Yeast infection involving the vagina and surrounding area 05/09/2015       Past Surgical History     Past Surgical History:   Procedure Laterality Date   . ADENOIDECTOMY     . EGD N/A 01/23/2018    Procedure: EGD;  Surgeon: Jacqulyn Cane, MD;  [facility];  Service: Gastroenterology;  Laterality: N/A;  EGD  Q1=N/A   . TONSILLECTOMY     . WISDOM TOOTH EXTRACTION         Family History     Family History    Problem Relation Age of Onset   . Cancer Mother    . Ehlers-Danlos syndrome Sister    . Asthma Brother        Social History     Social History     Socioeconomic History   . Marital status: Single     Spouse name: Not on file   . Number of children: Not on file   . Years of education: Not on file   . Highest education level: Not on file   Occupational History   . Not on file   Tobacco Use   . Smoking status: Never Smoker   . Smokeless tobacco: Never Used   . Tobacco comment: the patient vapes nicotine    Vaping Use   . Vaping Use: Every day   . Start date: 04/14/2018   Substance and Sexual Activity   . Alcohol use: Not Currently   . Drug use: Yes     Types: Marijuana     Comment: Pt. uses marijuana every day.   Marland Kitchen Sexual activity: Never     Comment: takes the Pill continuously   Other Topics Concern   .  Poor school performance No   . International travel in past 12 mos No   Social History Narrative   . Not on file     Social Determinants of Health     Financial Resource Strain: Low Risk    . Difficulty of Paying Living Expenses: Not hard at all   Food Insecurity: No Food Insecurity   . Worried About Programme researcher, broadcasting/film/video in the Last Year: Never true   . Ran Out of Food in the Last Year: Never true   Transportation Needs: No Transportation Needs   . Lack of Transportation (Medical): No   . Lack of Transportation (Non-Medical): No   Physical Activity:    . Days of Exercise per Week: Not on file   . Minutes of Exercise per Session: Not on file   Stress:    . Feeling of Stress : Not on file   Social Connections:    . Frequency of Communication with Friends and Family: Not on file   . Frequency of Social Gatherings with Friends and Family: Not on file   . Attends Religious Services: Not on file   . Active Member of Clubs or Organizations: Not on file   . Attends Banker Meetings: Not on file   . Marital Status: Not on file   Intimate Partner Violence:    . Fear of Current or Ex-Partner: Not on file   .  Emotionally Abused: Not on file   . Physically Abused: Not on file   . Sexually Abused: Not on file   Housing Stability:    . Unable to Pay for Housing in the Last Year: Not on file   . Number of Places Lived in the Last Year: Not on file   . Unstable Housing in the Last Year: Not on file   Mother at bedside    Allergies     Allergies   Allergen Reactions   . Haloperidol      Interacts with reglan and also pt had involuntary eye movements.    . Latex Itching     Itching irritation swelling condoms  Itching irritation swelling condoms  Itching irritation swelling condoms   . Reglan [Metoclopramide]    . Morphine Rash       Home Medications     Prior to Admission medications    Medication Sig Start Date End Date Taking? Authorizing Provider   Caraway Oil-Levomenthol (FDGARD PO) Take by mouth    [provider]   cloNIDine (CATAPRES) 0.1 MG tablet Take 0.05 mg by mouth nightly 06/10/20   [provider]   diphenhydrAMINE (BENADRYL) 50 MG tablet Take 50 mg by mouth nightly as needed    [provider]   Erythromycin 250 MG Tablet Delayed Response 1 tab Orally Four times a day 14 days 05/06/20   [provider]   imipramine (TOFRANIL) 10 MG tablet  04/04/20   [provider]   metoclopramide (REGLAN) 10 MG tablet Take 1 tablet (10 mg total) by mouth 3 (three) times daily as needed (nausea/vomiting) 04/03/20   Mauger, Abner Greenspan, MD   nitrofurantoin, macrocrystal-monohydrate, (MACROBID) 100 MG capsule Take 1 tab by mouth as a single dose taken within 2 hours of sexual intercourse 03/13/20   Reynold Bowen, MD   norethindrone-ethinyl estradiol (ORTHO-NOVUM 1/35) 1-35 MG-MCG per tablet Take 1 tablet by mouth daily 03/13/20   Reynold Bowen, MD   omeprazole (PriLOSEC) 40 MG capsule daily  04/04/20   [provider]   ondansetron (ZOFRAN-ODT) 4 MG disintegrating tablet Take 1 tablet (4 mg total) by mouth every 6 (six) hours as needed for Nausea 04/03/20   Mauger, Abner Greenspan, MD    prochlorperazine (COMPAZINE) 5 MG tablet Take 1 tablet (5 mg total) by mouth every 6 (six) hours as needed for Nausea 04/03/20   Mauger, Abner Greenspan, MD   pyridostigmine (MESTINON) 60 MG tablet Take 60 mg by mouth 2 (two) times daily    [provider]         Review of Systems     Review of Systems   Constitutional: Negative for fever.   HENT: Negative for sore throat.   Cardiovascular: Negative for chest pain.   Respiratory: Negative for cough and shortness of breath.   Gastrointestinal: Positive for nausea, vomiting, and abd pain. No hematemesis or diarrhea.  Genitourinary: No dysuria.      All other systems reviewed and are negative.      Physical Exam     Patient Vitals for the past 24 hrs:   BP Temp Temp src Pulse Resp SpO2 Weight   08/01/20 1848 140/81 99.8 F (37.7 C) Oral (!) 102 14 100 % -   08/01/20 1844 140/81 99.8 F (37.7 C) Oral 96 14 100 % -   08/01/20 1436 119/88 98.4 F (36.9 C) Oral (!) 109 16 99 % -   08/01/20 1434 - - - - - - 45.4 kg     Physical Exam   Nursing note and vitals reviewed.   Constitutional: Thin 24 y/o F. Pt is oriented to person, place, and time. Pt appears well-developed. Looks to not feel well.    HENT:     Head: Normocephalic and atraumatic.     Right Ear: External ear normal.     Left Ear: External ear normal.     Mouth/Throat: Normal speech. (Wearing a mask).  Eyes: Conjunctivae normal. No drainage. EOMI.  Neck: Normal range of motion.   Cardiovascular: Mildly tachycardic. Normal S1, S2. No murmur.    Pulmonary/Chest: Vesicular breath sounds throughout. No respiratory distress. O2 sat 99% on RA.   Abdominal: Bowel sounds are normal. Abdomen soft. Mild non-focal tenderness. No rebound or guarding.   Neurological: Pt is alert and oriented to person, place, and time. GCS 15.    Skin: Skin is warm and dry. Normal skin turgor.   Psychiatric: Normal mood and affect. Behavior is normal.         Orders Placed During This Encounter     Orders Placed This Encounter    Procedures   . CBC and differential   . Comprehensive metabolic panel   . Lipase   . GFR   . Beta HCG, Qual, Serum   . Diet NPO effective now   . Full Code   . ED Unit Sec Comm Order   . Saline lock IV   . Adult Admit to Observation       ED Medications Administered     ED Medication Orders (From admission, onward)    Start Ordered     Status Ordering Provider    08/01/20 1957 08/01/20 1956  promethazine (PHENERGAN) 12.5 mg in sodium chloride 0.9 % 50 mL IVPB  Once        Route: Intravenous  Ordered Dose: 12.5 mg     Last MAR action: Given WU, JULIA LEE    08/01/20 1653 08/01/20 1652  sodium chloride 0.9 % bolus 1,000  mL  Once        Route: Intravenous  Ordered Dose: 1,000 mL     Last MAR action: Stopped Laiba Fuerte A    08/01/20 1653 08/01/20 1652  LORazepam (ATIVAN) injection 1 mg  Once        Route: Intravenous  Ordered Dose: 1 mg     Last MAR action: Given Haylie Mccutcheon A    08/01/20 1632 08/01/20 1631  promethazine (PHENERGAN) 12.5 mg in sodium chloride 0.9 % 50 mL IVPB  Once        Route: Intravenous  Ordered Dose: 12.5 mg     Last MAR action: Given Heavenlee Maiorana A          Diagnostic Study Results and Data Review     The results of the diagnostic studies below were reviewed by the ED provider:    Labs  Results     Procedure Component Value Units Date/Time    Beta HCG, Qual, Serum [[id number]] Collected: 08/01/20 1536     Updated: 08/01/20 1759     Hcg Qualitative Negative    Comprehensive metabolic panel [[id number]]  (Abnormal) Collected: 08/01/20 1536    Specimen: Blood Updated: 08/01/20 1620     Glucose 73 mg/dL      BUN 11 mg/dL      Creatinine 0.7 mg/dL      Sodium 086 mEq/L      Potassium 3.6 mEq/L      Chloride 101 mEq/L      CO2 21 mEq/L      Calcium 9.8 mg/dL      Protein, Total 7.4 g/dL      Albumin 4.6 g/dL      AST (SGOT) 18 U/L      ALT 17 U/L      Alkaline Phosphatase 53 U/L      Bilirubin, Total 0.9 mg/dL      Globulin 2.8 g/dL      Albumin/Globulin Ratio 1.6     Anion Gap 15.0    Lipase  [[id number]] Collected: 08/01/20 1536    Specimen: Blood Updated: 08/01/20 1620     Lipase 20 U/L     GFR [[id number]] Collected: 08/01/20 1536     Updated: 08/01/20 1620     EGFR >60.0    CBC and differential [[id number]]  (Abnormal) Collected: 08/01/20 1536    Specimen: Blood Updated: 08/01/20 1543     WBC 9.40 x10 3/uL      Hgb 14.1 g/dL      Hematocrit 64.4 %      Platelets 340 x10 3/uL      RBC 4.70 x10 6/uL      MCV 89.1 fL      MCH 30.0 pg      MCHC 33.7 g/dL      RDW 13 %      MPV 9.5 fL      Neutrophils 69.0 %      Lymphocytes Automated 24.5 %      Monocytes 5.6 %      Eosinophils Automated 0.0 %      Basophils Automated 0.7 %      Immature Granulocytes 0.2 %      Nucleated RBC 0.0 /100 WBC      Neutrophils Absolute 6.48 x10 3/uL      Lymphocytes Absolute Automated 2.30 x10 3/uL      Monocytes Absolute Automated 0.53 x10 3/uL      Eosinophils Absolute Automated 0.00 x10 3/uL  Basophils Absolute Automated 0.07 x10 3/uL      Immature Granulocytes Absolute 0.02 x10 3/uL      Absolute NRBC 0.00 x10 3/uL           Radiologic Studies  Radiology Results (24 Hour)     ** No results found for the last 24 hours. **               MDM and Clinical Notes     Working Differential (not completely inclusive): cannabis hyperemesis, gastroparesis, sbo, etc    Nursing records reviewed and agree: Yes     Clinical Notes: Pt is a 24 y/o F, chronic marijuana user, who presents with cyclic vomiting. 3rd ED visit now this week for the same. Not able to keep down PO fluids or meds at home despite being Rx'd PPI and antiemetics. Recc check basic labs, hydrate and treat for nausea.     White count normal  H/H wnl  Platelets normal  Cr wnl  HCG neg  LFTs, bili and lipase normal    Re-Eval: Re-eval after phenergan - Pt requesting ativan for her pain. Will order.    Phone Conversation: Spoke w/ Jerilee Hoh ([facility] resident) - Case discussed. Will see pt in ED for consult.    Spoke w/ Efraim Kaufmann (GI APC for [facility]) - Will notify rounding  attending for this weekend to see if they can assess pt in-house should she need admission. Otherwise okay w/ plans for discharge.    Patient signed out to Rayford Halsted, PA-C at 530PM pending [facility] eval and re-evaluation.    Personal Protective Equipment:  I was wearing appropriate personal protective equipment at the time of patient evaluation and for all face-to-face interactions:     Pandemic Caveat:  The patient was seen and evaluated during the time of COVID pandemic.  Significant limitations can be present in the evaluation and management of emergency department patients during pandemic conditions, including but not limited to lack of testing, rapidly changing IHS protocols, and/or limited follow-up resources.         Prescriptions       New Prescriptions    No medications on file       Diagnosis and Disposition     Clinical Impression:  1. Cyclical vomiting    2. Intractable vomiting with nausea, unspecified vomiting type      Final diagnoses:   Cyclical vomiting   Intractable vomiting with nausea, unspecified vomiting type       Disposition:  ED Disposition     ED Disposition Condition Date/Time Comment    Observation  Fri Aug 01, 2020  7:17 PM Admitting Physician: Angelina Sheriff [[id number]]   Service:: Medicine [[id number]]   Estimated Length of Stay: < 2 midnights   Tentative Discharge Plan?: Home or Self Care [1]   Does patient need telemetry?: No                  Rendering Provider: Cyndy Freeze. Jeannetta Nap, PA-C, CAQ-EM    Attending's signature signifies review of the provider note and clinical impression.      This note was generated by the [facility] EMR system/Dragon speech recognition and may contain inherent errors or omissions not intended by the user. Grammatical errors, random word insertions, deletions, pronoun errors and incomplete sentences are occasional consequences of this technology due to software limitations. Not all errors are caught or corrected. If there are questions or concerns about the content of this note or  information contained  within the body of this dictation they should be addressed directly with the author for clarification.               Nigel Sloop, [place]  08/01/20 2055       Harden Mo, MD  08/01/20 2111

## 2020-08-01 NOTE — EDIE (Signed)
COLLECTIVE?NOTIFICATION?08/01/2020 14:11?Bridget, [name] A?MRN: [id number]    Criteria Met      5 ED Visits in 12 Months    Security and Safety  No recent Security Events currently on file    ED Care Guidelines  There are currently no ED Care Guidelines for this patient. Please check your facility's medical records system.    Flags      Negative COVID-19 Lab Result - [facility] - A specimen collected from this patient was negative for COVID-19 / Attributed By: [place] [facility] / Attributed On: 07/26/2020       Prescription Monitoring Program  000??- Narcotic Use Score  000??- Sedative Use Score  000??- Stimulant Use Score  000??- Overdose Risk Score  - All Scores range from 000-999 with 75% of the population scoring < 200 and on 1% scoring above 650  - The last digit of the narcotic, sedative, and stimulant score indicates the number of active prescriptions of that type  - Higher Use scores correlate with increased prescribers, pharmacies, mg equiv, and overlapping prescriptions  - Higher Overdose Risk Scores correlate with increased risk of unintentional overdose death   Concerning or unexpectedly high scores should prompt a review of the PMP record; this does not constitute checking PMP for prescribing purposes.      E.D. Visit Count (12 mo.)  Facility Visits   [facility] 7   [facility]) 1   [facility] 2   Total 10   Note: Visits indicate total known visits.     Recent Emergency Department Visit Summary  Date Facility [facility] Type Diagnoses or Chief Complaint   Aug 01, 2020 [facility] [place] [facility] [place] [facility]      Emesis      Jul 31, 2020 [facility] [place] [facility] [place] [facility]      Emesis; Nausea      Emesis      Nausea with vomiting, unspecified      Jul 30, 2020 [facility] [place] [facility] [place] [facility]      Nausea; Vomitting      Emesis      Cyclical vomiting syndrome unrelated to migraine      Dehydration      Apr 06, 2020 [facility] [place] [facility] [place] [facility]      N/V;abd pain      Abdominal Pain      Dehydration      Cyclical vomiting syndrome unrelated to migraine      Apr 04, 2020 [place] [facility] [place] [facility]      weakness; not able to keep food or liquid down      Emesis      Abdominal Pain      Acidosis      Other chronic pain      Elevated blood-pressure reading, without diagnosis of hypertension      Cyclical vomiting syndrome unrelated to migraine      Generalized abdominal pain      Nausea with vomiting, unspecified      Other disorders of electrolyte and fluid balance, not elsewhere classified      Apr 02, 2020 [facility] [place] [facility] [place] [facility]      ABD PAIN- emesis      Abdominal Pain      Cyclical vomiting syndrome unrelated to migraine      Apr 01, 2020 [facility] [place] [facility] [place]  [facility]      Emesis      Nausea      Cyclical vomiting syndrome unrelated to migraine      Mar 31, 2020 [place] [facility] [place] [facility] [place] [facility]      Triage      Abdominal Pain      Emesis      Cyclical vomiting syndrome unrelated to migraine      Feb 05, 2020 [facility] [place] [facility] [place] [facility]      emesis;weakness      Nausea      Nausea with vomiting, unspecified      Generalized abdominal pain      Jan 30, 2020 [place] [facility] [place] [facility] [place] [place] [facility]      triage: vomiting, abdominal pain      Emesis      Nausea with vomiting, unspecified          Recent Inpatient Visit Summary  Date Facility [facility] Type Diagnoses or Chief Complaint   Jan 30, 2020 [place] [facility] [place] [facility] [place]      Nausea with vomiting, unspecified          Care Team  Provider Specialty Phone Fax Service Dates   Wynne Dust, MD Pediatrics [phone] Current      Collective Portal  This patient has registered at the [facility] Emergency Department   For more information visit: [email or web address]     PLEASE NOTE:     1.    Any care recommendations and other clinical information are provided as guidelines or for historical purposes only, and providers should exercise their own clinical judgment when providing care.    2.   You may only use this information for purposes of treatment, payment or health care operations activities, and subject to the limitations of applicable Collective Policies.    3.   You should consult directly with the organization that provided a care guideline or other clinical history with any questions about additional information or accuracy or completeness of information provided.    ? 2021 [place], [employer]. - [email or web address]

## 2020-08-02 DIAGNOSIS — I498 Other specified cardiac arrhythmias: Secondary | ICD-10-CM

## 2020-08-02 DIAGNOSIS — N809 Endometriosis, unspecified: Secondary | ICD-10-CM

## 2020-08-02 DIAGNOSIS — Q796 Ehlers-Danlos syndrome, unspecified: Secondary | ICD-10-CM

## 2020-08-02 DIAGNOSIS — K30 Functional dyspepsia: Secondary | ICD-10-CM

## 2020-08-02 DIAGNOSIS — R1115 Cyclical vomiting syndrome unrelated to migraine: Secondary | ICD-10-CM

## 2020-08-02 MED ORDER — DICYCLOMINE HCL 10 MG PO CAPS
20.0000 mg | ORAL_CAPSULE | Freq: Four times a day (QID) | ORAL | Status: DC | PRN
Start: 2020-08-02 — End: 2020-08-02
  Administered 2020-08-02: 16:00:00 20 mg via ORAL
  Filled 2020-08-02 (×2): qty 2

## 2020-08-02 MED ORDER — DICYCLOMINE HCL 10 MG PO CAPS
20.0000 mg | ORAL_CAPSULE | Freq: Four times a day (QID) | ORAL | 1 refills | Status: AC | PRN
Start: 2020-08-02 — End: 2020-09-01

## 2020-08-02 MED ORDER — DRONABINOL 2.5 MG PO CAPS
5.0000 mg | ORAL_CAPSULE | Freq: Two times a day (BID) | ORAL | Status: DC
Start: 2020-08-02 — End: 2020-08-02
  Administered 2020-08-02: 14:00:00 5 mg via ORAL
  Filled 2020-08-02 (×2): qty 2

## 2020-08-02 MED ORDER — ACETAMINOPHEN 325 MG PO TABS
650.0000 mg | ORAL_TABLET | Freq: Four times a day (QID) | ORAL | Status: DC | PRN
Start: 2020-08-02 — End: 2020-08-02

## 2020-08-02 MED ORDER — DICYCLOMINE HCL 10 MG PO CAPS
20.0000 mg | ORAL_CAPSULE | Freq: Four times a day (QID) | ORAL | Status: DC
Start: 2020-08-02 — End: 2020-08-02
  Administered 2020-08-02: 11:00:00 20 mg via ORAL
  Filled 2020-08-02: qty 2

## 2020-08-02 MED ORDER — PROMETHAZINE HCL 25 MG RE SUPP
25.0000 mg | Freq: Four times a day (QID) | RECTAL | 1 refills | Status: DC | PRN
Start: 2020-08-02 — End: 2021-07-19

## 2020-08-02 MED ORDER — KETOROLAC TROMETHAMINE 30 MG/ML IJ SOLN
15.0000 mg | Freq: Once | INTRAMUSCULAR | Status: DC | PRN
Start: 2020-08-02 — End: 2020-08-02

## 2020-08-02 MED ORDER — SODIUM CHLORIDE 0.9 % IV SOLN
250.0000 mg | Freq: Three times a day (TID) | INTRAVENOUS | Status: DC
Start: 2020-08-02 — End: 2020-08-02
  Administered 2020-08-02: 14:00:00 250 mg via INTRAVENOUS
  Filled 2020-08-02 (×4): qty 250

## 2020-08-02 MED ORDER — DRONABINOL 2.5 MG PO CAPS
2.5000 mg | ORAL_CAPSULE | Freq: Two times a day (BID) | ORAL | 0 refills | Status: AC
Start: 2020-08-03 — End: 2020-09-02

## 2020-08-02 MED ORDER — DRONABINOL 2.5 MG PO CAPS
5.0000 mg | ORAL_CAPSULE | Freq: Two times a day (BID) | ORAL | Status: DC
Start: 2020-08-02 — End: 2020-08-02

## 2020-08-02 NOTE — Progress Notes (Signed)
Patient discharged home with all belongings with mom. Discharge instruction provided regarding medications/ follolw up appointments. Verbalized understanding. PIV line removed from left arm. 2 doses of bentyl sent with patient per MD order. Transferred patient to front lobby via w/c

## 2020-08-02 NOTE — Plan of Care (Signed)
Patient A-O x4. Complaint of nausea.  PRN zofran, compazine, phenargan for nausea/vomiting. Dicyclomine for abdominal pain. On clear liquid diet. Tolerated well. Safety measures in place. Call light within reach.  Problem: Safety  Goal: Patient will be free from infection during hospitalization  Outcome: Progressing  Flowsheets (Taken 08/02/2020 1140)  Free from Infection during hospitalization:  . Assess and monitor for signs and symptoms of infection  . Monitor lab/diagnostic results  . Monitor all insertion sites (i.e. indwelling lines, tubes, urinary catheters, and drains)  . Encourage patient and family to use good hand hygiene technique     Problem: Altered GI Function  Goal: Fluid and electrolyte balance are achieved/maintained  Outcome: Progressing  Flowsheets (Taken 08/02/2020 1140)  Fluid and electrolyte balance are achieved/maintained:  . Monitor intake and output every shift  . Monitor/assess lab values and report abnormal values  . Provide adequate hydration  . Assess for confusion/personality changes  Goal: Nutritional intake is adequate  Outcome: Progressing  Flowsheets (Taken 08/02/2020 1140)  Nutritional intake is adequate:  . Assist patient with meals/food selection  . Allow adequate time for meals  . Encourage/perform oral hygiene as appropriate  . Assess anorexia, appetite, and amount of meal/food tolerated

## 2020-08-02 NOTE — Final Progress Note (DC Note for stay less than 48 (Signed)
[facility] Final Progress Note  Physician Available 24 hours a day:  Office#: [phone]     Date Time: 08/02/20 4:44 PM  Patient Name: Bridget [name]  Attending Physician: Angelina Sheriff, MD  Primary Care Physician: Reynold Bowen, MD    CC: Cyclical vomiting    Assessment:   Principal Problem:    Cyclical vomiting  Active Problems:    Cyclic vomiting syndrome  Resolved Problems:    * No resolved hospital problems. *    24 y.o. female with a history of asthma, Ehlers-Danlos syndrome, POTS, cyclic vomiting syndrome admitted with intractable nausea and vomiting for IV hydration and antiemetics.  Currently with reported improvement in nausea, vomiting, and abdominal pain.  Comfortable with discharge home with close GI follow-up.    Plan:   #Intractable nausea and vomiting  Possibly 2/2 marijuana overuse  Gastroparesis may be contributing  EKG: appropriate qtc   -Continue home prescription regimen of scheduled erythromycin, and as needed prochlorperazine, Zofran  -Avoid Reglan and Haldol given adverse reactions in the past  -Continue Prilosec at home for PPI therapy  -New prescription Bentyl for abdominal cramps/pain  -New prescription dronabinol in place of marijuana use to reduce risk of cyclic vomiting syndrome related to marijuana, though still with risk given cannabinoid pathway  -Could consider CVS prophylaxis with amitriptyline 10 mg daily    #POTS  Stable.  Has been evaluated by Cardiology.    #FEN/GI : clear liquid diet; IV NS at 75 mL/hr     #PPX  - DVT: SCDs  - GI: home PPI equivalent    CODE STATUS: Full code    DISPO:  Based on the information available on the day of this admission, patient will be admitted to service status:  Observation: Due to Need for IV antiemetics due to cyclic vomiting syndrome; otherwise stable  Anticipated medical stability for discharge: Chilton Si today  Anticipated discharge needs: Tolerating PO regimen    Subjective:     Interval History/HPI:     Patient  was admitted overnight and started on IV fluids. Received IV antiemetics and prokinetics (erythromycin 250 mg q6h) provided. Also started on Dronabinol and Bentyl for appetite stimulation and abdominal pain, respectively. EKG with appropriate QTc.     Given poor appetite, appetite stimulant alternatives were discussed and Dronabinol 5 mg BID was started. By day of discharge, patient was able to tolerate PO.     Patient is currently connected with motility clinic at [facility]. Motility study planned for January.       Review of Systems:     (As noted in HPI)    Physical Exam:     VITAL SIGNS: Physical Exam:   Temp:  [97.9 F (36.6 C)-99.8 F (37.7 C)] 97.9 F (36.6 C)  Heart Rate:  [85-112] 103  Resp Rate:  [14-17] 15  BP: (107-141)/(54-89) 141/87        Intake/Output Summary (Last 24 hours) at 08/02/2020 1644  Last data filed at 08/01/2020 1823  Gross per 24 hour   Intake 1000 ml   Output -   Net 1000 ml    GEN: awake, alert and oriented x 3  HEENT: MMM  CARDS: regular rate and rhythm, no m/r/g  LUNGS: clear to auscultation bilaterally, without wheezing, rhonchi, or rales  ABD: soft, normoactive BS.   EXT: no lower extremity edema         Current Meds:     Current Facility-Administered Medications   Medication Dose Route  Frequency   . dronabinol  5 mg Oral BID   . erythromycin  250 mg Intravenous Q8H   . pantoprazole  40 mg Oral QAM AC     Current Facility-Administered Medications   Medication Dose Route Frequency Last Rate   . sodium chloride   Intravenous Continuous 75 mL/hr at 08/01/20 2343     Current Facility-Administered Medications   Medication Dose Route   . acetaminophen  650 mg Oral   . dextrose  15 g of glucose Oral    And   . dextrose  12.5 g Intravenous    And   . glucagon (rDNA)  1 mg Intramuscular   . dicyclomine  20 mg Oral   . ketorolac  15 mg Intravenous   . naloxone  0.2 mg Intravenous   . ondansetron  4 mg Oral   . prochlorperazine  5 mg Intravenous   . promethazine (PHENERGAN) IVPB  12.5  mg Intravenous         Labs:     Labs (last 24 hours):  Results     Procedure Component Value Units Date/Time    Beta HCG, Drema Dallas, Serum [[id number]] Collected: 08/01/20 1536     Updated: 08/01/20 1759     Hcg Qualitative Negative          Imaging (last 24 hours), reviewed and are significant for:  No results found.      Signed by: Ferne Coe, DO  Office#: [phone]     WG:[id number], Orma Render, MD

## 2020-08-02 NOTE — Discharge Instr - AVS First Page (Addendum)
[facility] Inpatient Service  Discharge Instructions for  Bridget [name]    Date Printed: 08/02/20  Primary Doctor's Name: Reynold Bowen, MD, Fax: [phone] Phone: [phone]    Follow up time frame: 3-5 days    Please bring this plan and all your medications to your follow up appointments and any future hospital visits    Diagnosis: Intractable nausea & vomiting likely related to cyclic vomiting syndrome or delayed gastric emptying    Medications:   Please take your medication as instructed in the AVS.  Changes to home medications:   - Strongly recommend discussion with GI specialist and primary care provider regarding stopping marijuana as this could be causing/contributing to your nausea & vomiting  - You can also take over-the-counter benadryl, 25mg  (1 tablet) with the compazine to prevent shaking or feeling of restlessness.   - Continue taking Erythromycin four times daily  - Continue taking Zofran and Reglan as needed    New medications prescribed:     - Dronabinol 2.5 mg twice daily (1 hour before lunch and dinner). This medication is to be taken in place of marijuana, not in addition to.        Special Instructions:   - Please follow-up with [facility] GI specialist         [facility]  [phone]  [address]  [place], [place] [address]

## 2020-08-02 NOTE — Plan of Care (Signed)
Problem: Safety  Goal: Patient will be free from injury during hospitalization  08/02/2020 0118 by Marye Round, RN  Outcome: Progressing  Flowsheets (Taken 08/02/2020 0117)  Patient will be free from injury during hospitalization:   Assess patient's risk for falls and implement fall prevention plan of care per policy   Provide and maintain safe environment   Include patient/ family/ care giver in decisions related to safety   [place] rounding  08/02/2020 0117 by Marye Round, RN  Outcome: Progressing  Flowsheets (Taken 08/02/2020 0117)  Patient will be free from injury during hospitalization:   Assess patient's risk for falls and implement fall prevention plan of care per policy   Provide and maintain safe environment   Include patient/ family/ care giver in decisions related to safety   [place] rounding  Goal: Patient will be free from infection during hospitalization  08/02/2020 0118 by Marye Round, RN  Outcome: Progressing  Flowsheets (Taken 08/02/2020 0117)  Free from Infection during hospitalization:   Assess and monitor for signs and symptoms of infection   Monitor lab/diagnostic results  08/02/2020 0117 by Marye Round, RN  Outcome: Progressing  Flowsheets (Taken 08/02/2020 0117)  Free from Infection during hospitalization:   Assess and monitor for signs and symptoms of infection   Monitor lab/diagnostic results     Problem: Altered GI Function  Goal: Fluid and electrolyte balance are achieved/maintained  Outcome: Progressing  Flowsheets (Taken 08/02/2020 0118)  Fluid and electrolyte balance are achieved/maintained:   Monitor intake and output every shift   Monitor/assess lab values and report abnormal values   Assess and reassess fluid and electrolyte status   Provide adequate hydration  Goal: Nutritional intake is adequate  Outcome: Progressing  Flowsheets (Taken 08/02/2020 0118)  Nutritional intake is adequate:   Allow adequate time for meals   Encourage/administer dietary supplements as  ordered (i.e. tube feed, TPN, oral, OGT/NGT, supplements)

## 2020-08-02 NOTE — Progress Notes (Signed)
[facility] Progress Note  Physician Available 24 hours a day:  [place] Resident: [phone]  [place] Resident: [phone]  Office#: [phone]     Date Time: 08/02/20 6:02 AM  Patient Name: Bridget [name]  Attending Physician: Angelina Sheriff, MD  Primary Care Physician: Reynold Bowen, MD    CC: Cyclical vomiting    Assessment:   Principal Problem:    Cyclical vomiting  Active Problems:    Cyclic vomiting syndrome  Resolved Problems:    * No resolved hospital problems. *    24 y.o. female with a history of asthma, Ehlers-Danlos syndrome, POTS, cyclic vomiting syndrome admitted with intractable nausea and vomiting for IV hydration and antiemetics.     Plan:   #Intractable nausea and vomiting  Possibly 2/2 marijuana overuse  Gastroparesis may be contributing  EKG: appropriate qtc   -Prochlorperazine 5 mg IV q4hrs PRN  -Ondansetron 4 mg ODT q6hrs PRN  -Pantoprazole 40 mg PO in place of home Prilosec    -Clear liquid diet, advance as tolerated  -Avoid Reglan and Haldol as pt reports adverse reactions    - Continue Erythromycin 250 mg IV q8hr for gastroparesis   - Given repetitive ED visits trial for n/v, could recommend possible prophylaxis such as Amitriptyline 10 mg     - Marijuana usage is for appetite stimulation, and would recommend alteratives like Dronabinol (though can still exacerbate CVS) or Mirtazapine     #Abdominal Pain  - Continue Tylenol 650 mg q6h   - Heat pack for abdomen  - Toradol 15 mg PRN for moderate pain   - Dicyclomine (Bentyl) 20 mg q6h PRN     #POTS  Stable.  Has been evaluated by Cardiology.    #FEN/GI : clear liquid diet; IV NS at 75 mL/hr     #PPX  - DVT: SCDs  - GI: home PPI equivalent    CODE STATUS: Full code    DISPO:  Based on the information available on the day of this admission, patient will be admitted to service status:  Observation: Due to Need for IV antiemetics due to cyclic vomiting syndrome; otherwise stable  Anticipated medical stability for  discharge:Yellow - maybe tomorrow  Anticipated discharge needs: Tolerating PO regimen, currently on CLD     Subjective:     Interval History/HPI:     Continues to have abdominal pain, and nausea that has not changed.     Over the past few days has tried to stay hydrated, and eat foods like rice, but food would always be vomited up. Usually Bilious (green)     Open to appetite stimulant alternatives. Currently working with [facility] team in regards to gastroparesis, and has emptying study scheduled for January. Had good success with erythromycin that was recently prescribed (made her feel hungry)     Review of Systems:     (As noted in HPI)    Physical Exam:     VITAL SIGNS: Physical Exam:   Temp:  [98.1 F (36.7 C)-99.8 F (37.7 C)] 98.6 F (37 C)  Heart Rate:  [91-112] 98  Resp Rate:  [14-17] 16  BP: (111-140)/(75-88) 135/88        Intake/Output Summary (Last 24 hours) at 08/02/2020 0602  Last data filed at 08/01/2020 1823  Gross per 24 hour   Intake 1000 ml   Output -   Net 1000 ml    GEN: awake, alert and oriented x 3  HEENT: MMM  CARDS: regular  rate and rhythm, no m/r/g  LUNGS: clear to auscultation bilaterally, without wheezing, rhonchi, or rales  ABD: soft, normoactive BS. Tender to palpation (worse in epigastric location)   EXT: no lower extremity edema         Current Meds:     Current Facility-Administered Medications   Medication Dose Route Frequency   . erythromycin  125 mg Intravenous Q8H   . influenza  0.5 mL Intramuscular Once   . pantoprazole  40 mg Oral QAM AC     Current Facility-Administered Medications   Medication Dose Route Frequency Last Rate   . sodium chloride   Intravenous Continuous 75 mL/hr at 08/01/20 2343     Current Facility-Administered Medications   Medication Dose Route   . dextrose  15 g of glucose Oral    And   . dextrose  12.5 g Intravenous    And   . glucagon (rDNA)  1 mg Intramuscular   . naloxone  0.2 mg Intravenous   . ondansetron  4 mg Oral   . prochlorperazine  5 mg  Intravenous   . promethazine (PHENERGAN) IVPB  12.5 mg Intravenous         Labs:     Labs (last 24 hours):  Results     Procedure Component Value Units Date/Time    Beta HCG, Qual, Serum [[id number]] Collected: 08/01/20 1536     Updated: 08/01/20 1759     Hcg Qualitative Negative    Comprehensive metabolic panel [[id number]]  (Abnormal) Collected: 08/01/20 1536    Specimen: Blood Updated: 08/01/20 1620     Glucose 73 mg/dL      BUN 11 mg/dL      Creatinine 0.7 mg/dL      Sodium 284 mEq/L      Potassium 3.6 mEq/L      Chloride 101 mEq/L      CO2 21 mEq/L      Calcium 9.8 mg/dL      Protein, Total 7.4 g/dL      Albumin 4.6 g/dL      AST (SGOT) 18 U/L      ALT 17 U/L      Alkaline Phosphatase 53 U/L      Bilirubin, Total 0.9 mg/dL      Globulin 2.8 g/dL      Albumin/Globulin Ratio 1.6     Anion Gap 15.0    Lipase [[id number]] Collected: 08/01/20 1536    Specimen: Blood Updated: 08/01/20 1620     Lipase 20 U/L     GFR [[id number]] Collected: 08/01/20 1536     Updated: 08/01/20 1620     EGFR >60.0    CBC and differential [[id number]]  (Abnormal) Collected: 08/01/20 1536    Specimen: Blood Updated: 08/01/20 1543     WBC 9.40 x10 3/uL      Hgb 14.1 g/dL      Hematocrit 38.7 %      Platelets 340 x10 3/uL      RBC 4.70 x10 6/uL      MCV 89.1 fL      MCH 30.0 pg      MCHC 33.7 g/dL      RDW 13 %      MPV 9.5 fL      Neutrophils 69.0 %      Lymphocytes Automated 24.5 %      Monocytes 5.6 %      Eosinophils Automated 0.0 %      Basophils Automated 0.7 %  Immature Granulocytes 0.2 %      Nucleated RBC 0.0 /100 WBC      Neutrophils Absolute 6.48 x10 3/uL      Lymphocytes Absolute Automated 2.30 x10 3/uL      Monocytes Absolute Automated 0.53 x10 3/uL      Eosinophils Absolute Automated 0.00 x10 3/uL      Basophils Absolute Automated 0.07 x10 3/uL      Immature Granulocytes Absolute 0.02 x10 3/uL      Absolute NRBC 0.00 x10 3/uL           Imaging (last 24 hours), reviewed and are significant for:  No results found.      Signed by:  Alphonzo Dublin, MD, MD   IF[facility]: [phone]  FOH: [phone]  Office#: [phone]     VH:QIONGE, Orma Render, MD

## 2020-08-02 NOTE — UM Notes (Addendum)
** This review is compiled from documentation provided by the treatment team within the patient's medical record. **     Janace Litten, RN, BSN  Clinical Case Manager - Utilization Review  [facility]    [address]    [place], [place] [address]  NPI: [id number]  Tax ID: [id number]  Phone: [phone]  Fax: [phone]     Please use fax number or insurance line to provide authorization for hospital services or to request additional information.       DATE OF REVIEW: 12/17-12/18    Reason For Hospitalization:    Pt is a 24 y.o. female with history of gastroparesis, cyclic vomiting, marijuana use, ehlers-danlos, and POTS who presented to the ED with intermittent nausea and vomiting for the past 4 days despite omeprazole and phenergan. Patient states she has been unable to keep PO down including fluids. This is the 3rd ER visit this week for the same.    PAST MEDICAL HISTORY:   has a past medical history of Abdominal pain, Asthma, Complication of anesthesia, Cyclical vomiting, Ehlers-Danlos disease, Gastroparesis, Post-operative nausea and vomiting, POTS (postural orthostatic tachycardia syndrome), Sinus trouble, UTI (urinary tract infection), and Yeast infection involving the vagina and surrounding area (05/09/2015).    ED TRIAGE VITALS:  ED Triage Vitals   Enc Vitals Group      BP 08/01/20 1436 119/88      Heart Rate 08/01/20 1436 (!) 109      Resp Rate 08/01/20 1436 16      Temp 08/01/20 1436 98.4 F (36.9 C)      Temp Source 08/01/20 1436 Oral      SpO2 08/01/20 1436 99 %      Weight 08/01/20 1434 45.4 kg (100 lb 1.4 oz)      Height 08/01/20 2124 1.626 m (5\' 4" )      Peak Flow --       Pain Score 08/01/20 1434 4       ED MEDICATIONS:  ED Medication Orders (From admission, onward)    Start Ordered     Status Ordering Provider    08/01/20 1957 08/01/20 1956  promethazine (PHENERGAN) 12.5 mg in sodium chloride 0.9 % 50 mL IVPB  Once        Route: Intravenous  Ordered Dose: 12.5 mg     Last MAR  action: Given WU, JULIA LEE    08/01/20 1653 08/01/20 1652  sodium chloride 0.9 % bolus 1,000 mL  Once        Route: Intravenous  Ordered Dose: 1,000 mL     Last MAR action: Stopped ELKINS, TARA A    08/01/20 1653 08/01/20 1652  LORazepam (ATIVAN) injection 1 mg  Once        Route: Intravenous  Ordered Dose: 1 mg     Last MAR action: Given Deborah Chalk A    08/01/20 1632 08/01/20 1631  promethazine (PHENERGAN) 12.5 mg in sodium chloride 0.9 % 50 mL IVPB  Once        Route: Intravenous  Ordered Dose: 12.5 mg     Last MAR action: Given ELKINS, TARA A          PATIENT CLASS:  Patient placed to Observation on 08/01/2020    Assessment:     24 y.o. female with a history of asthma, Ehlers-Danlos syndrome, POTS, cyclic vomiting syndrome admitted with intractable nausea and vomiting for IV hydration and antiemetics.     Plan:   #  Intractable nausea and vomiting  Possibly 2/2 marijuana overuse  Gastroparesis may be contributing  -Prochlorperazine 5 mg IV q4hrs PRN  -Ondansetron 4 mg ODT q6hrs PRN  -Pantoprazole 40 mg PO in place of home Prilosec  -Trial erythromycin 125 mg (~3 mg/kg) q8hrs for gastroparesis  -EKG to assess QT  -IV fluids: NS at 75 mL/hr  -Clear liquid diet, advance as tolerated  -Avoid Reglan and Haldol as pt reports adverse reactions    #POTS  Stable.  Has been evaluated by Cardiology.    #FEN/GI : clear liquid diet; IV NS at 75 mL/hr    #PPX  - DVT: SCDs  - GI: home PPI equivalent    CODE STATUS: Full code    12/18:    -Patient remains on medical floor  -Continued IVF and IV abx   -Clear liquid diet, advance as tolerated  -Patient with continued nausea and vomiting requiring IV antiemetics    Per Medicine:    Cyclic vomiting--this is a complicated chronic course with a negative GI work-up  Presently causing dehydration based on physical examination.  Somewhat improved and blood work, but overall still unable to take p.o. and looks dry.  We will continue IV fluids  In addition we will continue  prokinetic IV erythromycin  Upon discharge we should consider dronabinol.    I wonder if there is a role for celiac plexus block. If the patient is still here on Monday we should engage a consultation from interventional anesthesiology.    She is already connected with the motility clinic at [facility] and I have encouraged them to reengage them upon discharge.    The patient was admitted inpatient due to my suspicion that she needs more than 2 midnights. Her laboratory work suggested dehydration, she needs persistent IV fluids, as she is still unable to take p.o., and I believe she may benefit from an inpatient consultation with interventional anesthesiology.    Anthropometrics  Height: 162.6 cm (5\' 4" )  Weight: 45.4 kg (100 lb)  Weight Change: -0.09  BMI (calculated): 17.2    Scheduled Meds:  Current Facility-Administered Medications   Medication Dose Route Frequency   . dicyclomine  20 mg Oral TID AC & HS   . erythromycin  250 mg Intravenous Q8H   . influenza  0.5 mL Intramuscular Once   . pantoprazole  40 mg Oral QAM AC     Continuous Infusions:  . sodium chloride 75 mL/hr at 08/01/20 2343     prochlorperazine (COMPAZINE) injection 5 mg  Dose: 5 mg x 1  ondansetron (ZOFRAN-ODT) disintegrating tablet 4 mg  Dose: 4 mg x 1  promethazine (PHENERGAN) 12.5 mg in sodium chloride 0.9 % 50 mL IVPB  Dose: 12.5 mg x 2

## 2020-08-02 NOTE — Progress Notes (Signed)
[facility] Attending Note    This patient was evaluated on rounds with Dr. Shayne Alken.  In addition I duplicated the history and physical. I had reviewed everything last night remotely, but in person today reperformed the entire history and physical, reviewed all the objective data including radiology, cardiology, laboratory studies.  History was also obtained from the patient's mother    Physical exam directly observed and duplicated.  I agree with assessment and plan as outlined.      Cyclic vomiting--this is a complicated chronic course with a negative GI work-up  Presently causing dehydration based on physical examination.  Somewhat improved and blood work, but overall still unable to take p.o. and looks dry.  We will continue IV fluids  In addition we will continue prokinetic IV erythromycin  Upon discharge we should consider dronabinol.    I wonder if there is a role for celiac plexus block. If the patient is still here on Monday we should engage a consultation from interventional anesthesiology.    She is already connected with the motility clinic at [facility] and I have encouraged them to reengage them upon discharge.    The patient was admitted inpatient due to my suspicion that she needs more than 2 midnights. Her laboratory work suggested dehydration, she needs persistent IV fluids, as she is still unable to take p.o., and I believe she may benefit from an inpatient consultation with interventional anesthesiology.    Angelina Sheriff, MD  Clinical  Professor of [facility] Medicine  [facility] Medicine Residency   Office  [phone]   [place] Gip Resident: [phone]  After 6pm [phone]        Portions of this note were generated by [facility] Speech Recognition. It may contain errors or omissions not intended by the user. If there are questions about the content of this note, please contact the author for clarification.      Principal Problem:    Cyclical vomiting  Active Problems:     Cyclic vomiting syndrome

## 2020-08-02 NOTE — Consults (Signed)
Nutrition Assessment    Bridget [name] 24 y.o. female   MRN: [id number]      Reason for Assessment: MST-3 (wt loss, decreased appetite); Low BMI = 17.16 kg/m2    Nutrition Recommendation:  Goal: PO intake >70% of meals and supplements.   Plan:   1) Advance to regular diet.    2) Ensure Enlive BID.    3) Recommend 5mg  Marinol BID.     Bridget [name], RD  Clinical Dietitian  Ext. [phone]  ______________________________________________________________________      Assessment Data:  Summary:   24 y.o. female p/w intractable N/V. PMH significant for asthma, Ehlers-Danlos syndrome, POTS, cyclic vomiting syndrome, frequent marijuana use (reports using for appetite stimulation), gastroparesis. [facility] considering anesthesiology consult for possible celiac plaxus block. Suspected cause of s/s is marijuana overuse. On Zofran, PPI, and erythromycin.     Met with pt and mother at bedside. AAOx4. Reports small appetite at baseline, worse over past ~6 months and acutely worse over past week. Smokes marijuana regularly for appetite stimulation, finds that this is the only thing that has helped her eat enough in the past. +N/V over past week- worse if she does not take PO or smoke. No emesis since admit. Currently tolerating chicken noodle soup from home. Abdomen soft, NT/ND, +BS. Na and K+ WNL since admit. Tries to drink Orgain QD to help with wt gain. Previous UBW ~112# but dropped to 100# around June 2021 (first acute event with similar s/s this yr) and has not been able to regain since that time. EMR wts stable since June. No muscle or fat wasting appreciated. NKFA. No chewing/swallowing difficulty. Has had issues with reflux symptoms since birth- well controlled with dissolvable Protonix at home though occasionally misses doses when feeling ill, which she acknowledges contributes to symptoms. Pt reports wanting an alternative to smoking that will stimulate her appetite and manage the N/V, is very motivated to gain wt and improve  intake. Pt understands need to stop smoking marijuana and knows that this is likely contributing to her symptoms. Pt and mother are interested in starting on Marinol- they report this having been discussed with [facility] MD who stated it could not be started in the hospital. Called [facility] resident to request starting Marinol while pt remains admitted- 5mg  BID now ordered. Extensive amount of time spent on brainstorming ideas for improving intake and assuring pt that these symptoms can be managed without smoking. She and mother both express gratitude for visit and deny additional nutrition-related questions/concerns at this time.     Weight Monitoring Weight Weight Method   02/05/2020 45.1 kg Standing Scale   04/01/2020 46.1 kg Standing Scale   07/30/2020 46 kg Standing Scale   07/31/2020 46.6 kg Standing Scale   08/01/2020 45.4 kg Standing Scale   Wt appears roughly stable since June 2021.     Adm dx:  Cyclical vomiting   Patient Active Problem List   Diagnosis   . Postcoital UTI   . Asthma   . Delayed gastric emptying   . Ehlers-Danlos syndrome   . Endometriosis   . Postural orthostatic tachycardia syndrome   . ASCUS with positive high risk HPV cervical   . Cyclic vomiting syndrome   . Cyclical vomiting       PMH:  has a past medical history of Abdominal pain, Asthma, Complication of anesthesia, Cyclical vomiting, Ehlers-Danlos disease, Gastroparesis, Post-operative nausea and vomiting, POTS (postural orthostatic tachycardia syndrome), Sinus trouble, UTI (urinary tract infection), and Yeast infection involving  the vagina and surrounding area (05/09/2015).    Recent Labs   Lab 08/01/20  1536 07/31/20  1431 07/30/20  1220   Sodium 137 139 138   Potassium 3.6 3.6 3.8   Chloride 101 103 104   CO2 21* 23 21*   BUN 11 12 12    Creatinine 0.7 0.8 0.8   Glucose 73 93 110*   Calcium 9.8 10.3 10.4   EGFR >60.0 >60.0 >60.0   WBC 9.40 11.55* 13.99*   Hematocrit 41.9 44.9* 44.4*   Hgb 14.1 15.0* 14.8   Lipase 20 25 34   Bilirubin, Total 0.9  0.9 0.6   AST (SGOT) 18 18 17    ALT 17 18 15    Alkaline Phosphatase 53 59 57             Current Facility-Administered Medications   Medication Dose Route Frequency   . dronabinol  5 mg Oral BID   . erythromycin  250 mg Intravenous Q8H   . influenza  0.5 mL Intramuscular Once   . pantoprazole  40 mg Oral QAM AC     . sodium chloride 75 mL/hr at 08/01/20 2343     PRN meds given in the past 48 hours: Zofran, Compazine, Phenergan    Social History: Daily marijuana use. Daily nicotine vaping. Single. Supportive mother.            Intake History: see above                                                                             Orders Placed This Encounter      Diet clear liquid Patient preferences: home soup is ok, please advance as tolerated    Orders Placed This Encounter   Procedures   . Diet clear liquid Patient preferences: home soup is ok, please advance as tolerated       Food intake: tolerating chicken noodle soup from home    Allergies   Allergen Reactions   . Haloperidol      Interacts with reglan and also pt had involuntary eye movements.    . Latex Itching     Itching irritation swelling condoms  Itching irritation swelling condoms  Itching irritation swelling condoms   . Reglan [Metoclopramide]    . Morphine Rash       Nutrition Focused Physical Exam:  Head - WNL  Upper Body - no overt s/s subcutaneous muscle or fat loss  Lower Body - no s/s subcutaneous fat or muscle loss  Edema - none   Skin - pale, warm, dry  GI function - see above     Learning Needs: no formal education needs at this time     Anthropometrics  Height: 162.6 cm (5\' 4" )  Weight: 45.4 kg (100 lb)  Weight Change: -0.09  BMI (calculated): 17.2                       Total Daily Energy Needs: 1134 to 1360.8 kcal  Method for Calculating Energy Needs: 25 kcal - 30 kcal per kg  at 45.36 kg (Actual body weight)       Total Daily Protein Needs: 45.36 to 54.432 g  Method for Calculating Protein Needs: 1 g - 1.2 g per kg at 45.36 kg (Actual body  weight)       Total Daily Fluid Needs: 1360.8 to 1587.6 ml  Method for Calculating Fluid Needs: 30 ml - 35 ml  per kg at 45.36 kg (Actual body weight)       Nutrition Diagnosis:   Inadequate Protein-energy intake related to frequent nausea/vomiting, loss of appetite as evidenced by PO intake <50% of estimated needs for >5 days.     Intervention:  Goal: PO intake >70% of meals and supplements.   Plan:   1) Advance to regular diet.    2) Ensure Enlive BID.    3) Recommend 5mg  Marinol BID.     Monitoring/Evaluation:   1. PO intake  2. Weights  3. GI symptoms      Bridget [name], RD

## 2020-08-03 ENCOUNTER — Other Ambulatory Visit (INDEPENDENT_AMBULATORY_CARE_PROVIDER_SITE_OTHER): Payer: Self-pay | Admitting: Family

## 2020-08-03 DIAGNOSIS — R1115 Cyclical vomiting syndrome unrelated to migraine: Secondary | ICD-10-CM

## 2020-08-03 MED ORDER — PROMETHAZINE HCL 25 MG RE SUPP
25.0000 mg | Freq: Four times a day (QID) | RECTAL | 0 refills | Status: DC | PRN
Start: 2020-08-03 — End: 2020-08-04

## 2020-08-03 NOTE — Progress Notes (Signed)
Pt's mother called. Pt discharged from [facility]. Was given Phenergan supp for vomiting.  Just used last one. Would like short term refill to get through the weekend.

## 2020-08-04 ENCOUNTER — Observation Stay
Admission: EM | Admit: 2020-08-04 | Discharge: 2020-08-06 | Disposition: A | Discharge status: Home / Self Care | Payer: No Typology Code available for payment source | Attending: Family Medicine | Admitting: Family Medicine

## 2020-08-04 DIAGNOSIS — Z681 Body mass index (BMI) 19 or less, adult: Secondary | ICD-10-CM | POA: Insufficient documentation

## 2020-08-04 DIAGNOSIS — R112 Nausea with vomiting, unspecified: Secondary | ICD-10-CM | POA: Insufficient documentation

## 2020-08-04 DIAGNOSIS — I498 Other specified cardiac arrhythmias: Secondary | ICD-10-CM | POA: Insufficient documentation

## 2020-08-04 DIAGNOSIS — Q796 Ehlers-Danlos syndrome, unspecified: Secondary | ICD-10-CM | POA: Insufficient documentation

## 2020-08-04 DIAGNOSIS — E860 Dehydration: Principal | ICD-10-CM | POA: Insufficient documentation

## 2020-08-04 DIAGNOSIS — R1011 Right upper quadrant pain: Secondary | ICD-10-CM | POA: Insufficient documentation

## 2020-08-04 DIAGNOSIS — R000 Tachycardia, unspecified: Secondary | ICD-10-CM | POA: Insufficient documentation

## 2020-08-04 DIAGNOSIS — F1290 Cannabis use, unspecified, uncomplicated: Secondary | ICD-10-CM | POA: Insufficient documentation

## 2020-08-04 DIAGNOSIS — Z888 Allergy status to other drugs, medicaments and biological substances status: Secondary | ICD-10-CM | POA: Insufficient documentation

## 2020-08-04 DIAGNOSIS — R634 Abnormal weight loss: Secondary | ICD-10-CM | POA: Insufficient documentation

## 2020-08-04 DIAGNOSIS — J45909 Unspecified asthma, uncomplicated: Secondary | ICD-10-CM | POA: Insufficient documentation

## 2020-08-04 DIAGNOSIS — R1115 Cyclical vomiting syndrome unrelated to migraine: Secondary | ICD-10-CM | POA: Insufficient documentation

## 2020-08-04 DIAGNOSIS — R630 Anorexia: Secondary | ICD-10-CM | POA: Insufficient documentation

## 2020-08-04 DIAGNOSIS — Z885 Allergy status to narcotic agent status: Secondary | ICD-10-CM | POA: Insufficient documentation

## 2020-08-04 LAB — CBC AND DIFFERENTIAL
Absolute NRBC: 0 10*3/uL (ref 0.00–0.00)
Basophils Absolute Automated: 0.06 10*3/uL (ref 0.00–0.08)
Basophils Automated: 0.6 %
Eosinophils Absolute Automated: 0.03 10*3/uL (ref 0.00–0.44)
Eosinophils Automated: 0.3 %
Hematocrit: 50.3 % — ABNORMAL HIGH (ref 34.7–43.7)
Hgb: 17 g/dL — ABNORMAL HIGH (ref 11.4–14.8)
Immature Granulocytes Absolute: 0.04 10*3/uL (ref 0.00–0.07)
Immature Granulocytes: 0.4 %
Lymphocytes Absolute Automated: 2.41 10*3/uL (ref 0.42–3.22)
Lymphocytes Automated: 22.4 %
MCH: 30.1 pg (ref 25.1–33.5)
MCHC: 33.8 g/dL (ref 31.5–35.8)
MCV: 89 fL (ref 78.0–96.0)
MPV: 9.5 fL (ref 8.9–12.5)
Monocytes Absolute Automated: 0.72 10*3/uL (ref 0.21–0.85)
Monocytes: 6.7 %
Neutrophils Absolute: 7.51 10*3/uL — ABNORMAL HIGH (ref 1.10–6.33)
Neutrophils: 69.6 %
Nucleated RBC: 0 /100 WBC (ref 0.0–0.0)
Platelets: 415 10*3/uL — ABNORMAL HIGH (ref 142–346)
RBC: 5.65 10*6/uL — ABNORMAL HIGH (ref 3.90–5.10)
RDW: 13 % (ref 11–15)
WBC: 10.77 10*3/uL — ABNORMAL HIGH (ref 3.10–9.50)

## 2020-08-04 LAB — URINALYSIS REFLEX TO MICROSCOPIC EXAM - REFLEX TO CULTURE
Bilirubin, UA: NEGATIVE
Blood, UA: NEGATIVE
Glucose, UA: NEGATIVE
Ketones UA: 80 — AB
Nitrite, UA: NEGATIVE
Protein, UR: 100 — AB
Specific Gravity UA: 1.027 (ref 1.001–1.035)
Urine pH: 6 (ref 5.0–8.0)
Urobilinogen, UA: 4 mg/dL — AB (ref 0.2–2.0)

## 2020-08-04 LAB — HEPATIC FUNCTION PANEL
ALT: 18 U/L (ref 0–55)
AST (SGOT): 18 U/L (ref 5–34)
Albumin/Globulin Ratio: 1.6 (ref 0.9–2.2)
Albumin: 5.2 g/dL — ABNORMAL HIGH (ref 3.5–5.0)
Alkaline Phosphatase: 67 U/L (ref 37–106)
Bilirubin Direct: 0.3 mg/dL (ref 0.0–0.5)
Bilirubin Indirect: 0.8 mg/dL (ref 0.2–1.0)
Bilirubin, Total: 1.1 mg/dL (ref 0.2–1.2)
Globulin: 3.3 g/dL (ref 2.0–3.6)
Protein, Total: 8.5 g/dL — ABNORMAL HIGH (ref 6.0–8.3)

## 2020-08-04 LAB — BASIC METABOLIC PANEL
Anion Gap: 19 — ABNORMAL HIGH (ref 5.0–15.0)
BUN: 13 mg/dL (ref 7–19)
CO2: 22 mEq/L (ref 22–29)
Calcium: 10.5 mg/dL (ref 8.5–10.5)
Chloride: 95 mEq/L — ABNORMAL LOW (ref 100–111)
Creatinine: 0.9 mg/dL (ref 0.6–1.0)
Glucose: 89 mg/dL (ref 70–100)
Potassium: 3.5 mEq/L (ref 3.5–5.1)
Sodium: 136 mEq/L (ref 136–145)

## 2020-08-04 LAB — GFR: EGFR: >60

## 2020-08-04 LAB — LIPASE: Lipase: 44 U/L (ref 8–78)

## 2020-08-04 LAB — HCG, SERUM, QUALITATIVE: Hcg Qualitative: NEGATIVE

## 2020-08-04 MED ORDER — DRONABINOL 2.5 MG PO CAPS
2.5000 mg | ORAL_CAPSULE | Freq: Two times a day (BID) | ORAL | Status: DC
Start: 2020-08-04 — End: 2020-08-06
  Administered 2020-08-04 – 2020-08-06 (×4): 2.5 mg via ORAL
  Filled 2020-08-04 (×4): qty 1

## 2020-08-04 MED ORDER — ONDANSETRON HCL 4 MG/2ML IJ SOLN
4.0000 mg | Freq: Once | INTRAMUSCULAR | Status: AC
Start: 2020-08-04 — End: 2020-08-04
  Administered 2020-08-04: 13:00:00 4 mg via INTRAVENOUS
  Filled 2020-08-04: qty 2

## 2020-08-04 MED ORDER — GLUCAGON 1 MG IJ SOLR (WRAP)
1.0000 mg | INTRAMUSCULAR | Status: DC | PRN
Start: 2020-08-04 — End: 2020-08-06

## 2020-08-04 MED ORDER — GLUCOSE 40 % PO GEL
15.0000 g | ORAL | Status: DC | PRN
Start: 2020-08-04 — End: 2020-08-06

## 2020-08-04 MED ORDER — ERYTHROMYCIN BASE 250 MG PO TABS
250.0000 mg | ORAL_TABLET | Freq: Two times a day (BID) | ORAL | Status: DC
Start: 2020-08-04 — End: 2020-08-06
  Administered 2020-08-04 – 2020-08-05 (×2): 250 mg via ORAL
  Filled 2020-08-04 (×5): qty 1

## 2020-08-04 MED ORDER — PROCHLORPERAZINE MALEATE 5 MG PO TABS
5.0000 mg | ORAL_TABLET | Freq: Four times a day (QID) | ORAL | Status: DC | PRN
Start: 2020-08-04 — End: 2020-08-05
  Administered 2020-08-05: 09:00:00 5 mg via ORAL
  Filled 2020-08-04: qty 1

## 2020-08-04 MED ORDER — CALCIUM CARBONATE ANTACID 500 MG PO CHEW
1.0000 | CHEWABLE_TABLET | Freq: Every day | ORAL | Status: DC | PRN
Start: 2020-08-04 — End: 2020-08-06

## 2020-08-04 MED ORDER — AMITRIPTYLINE HCL 10 MG PO TABS
10.0000 mg | ORAL_TABLET | Freq: Every evening | ORAL | Status: DC
Start: 2020-08-04 — End: 2020-08-06
  Administered 2020-08-04 – 2020-08-05 (×2): 10 mg via ORAL
  Filled 2020-08-04 (×4): qty 1

## 2020-08-04 MED ORDER — LACTATED RINGERS IV SOLN
INTRAVENOUS | Status: DC
Start: 2020-08-04 — End: 2020-08-06

## 2020-08-04 MED ORDER — DEXTROSE 50 % IV SOLN
12.5000 g | INTRAVENOUS | Status: DC | PRN
Start: 2020-08-04 — End: 2020-08-06

## 2020-08-04 MED ORDER — SODIUM CHLORIDE 0.9 % IV BOLUS
1000.0000 mL | Freq: Once | INTRAVENOUS | Status: AC
Start: 2020-08-04 — End: 2020-08-04
  Administered 2020-08-04: 13:00:00 1000 mL via INTRAVENOUS

## 2020-08-04 MED ORDER — PANTOPRAZOLE SODIUM 40 MG IV SOLR
40.0000 mg | Freq: Once | INTRAVENOUS | Status: AC
Start: 2020-08-04 — End: 2020-08-04
  Administered 2020-08-04: 13:00:00 40 mg via INTRAVENOUS
  Filled 2020-08-04: qty 40

## 2020-08-04 MED ORDER — NORGESTREL-ETHINYL ESTRADIOL 0.3-30 MG-MCG PO TABS
1.0000 | ORAL_TABLET | Freq: Every day | ORAL | Status: DC
Start: 2020-08-05 — End: 2020-08-06

## 2020-08-04 MED ORDER — ONDANSETRON 4 MG PO TBDP
4.0000 mg | ORAL_TABLET | Freq: Four times a day (QID) | ORAL | Status: DC | PRN
Start: 2020-08-04 — End: 2020-08-05

## 2020-08-04 MED ORDER — PROMETHAZINE HCL 25 MG RE SUPP
25.0000 mg | Freq: Four times a day (QID) | RECTAL | Status: DC | PRN
Start: 2020-08-04 — End: 2020-08-06
  Administered 2020-08-05: 05:00:00 25 mg via RECTAL
  Filled 2020-08-04: qty 1

## 2020-08-04 MED ORDER — NALOXONE HCL 0.4 MG/ML IJ SOLN (WRAP)
0.2000 mg | INTRAMUSCULAR | Status: DC | PRN
Start: 2020-08-04 — End: 2020-08-06

## 2020-08-04 MED ORDER — DICYCLOMINE HCL 10 MG PO CAPS
20.0000 mg | ORAL_CAPSULE | Freq: Four times a day (QID) | ORAL | Status: DC | PRN
Start: 2020-08-04 — End: 2020-08-06

## 2020-08-04 MED ORDER — PANTOPRAZOLE SODIUM 40 MG PO TBEC
40.0000 mg | DELAYED_RELEASE_TABLET | Freq: Two times a day (BID) | ORAL | Status: DC
Start: 2020-08-04 — End: 2020-08-05
  Administered 2020-08-04 – 2020-08-05 (×2): 40 mg via ORAL
  Filled 2020-08-04 (×2): qty 1

## 2020-08-04 MED ORDER — ENOXAPARIN SODIUM 40 MG/0.4ML SC SOLN
40.0000 mg | Freq: Every day | SUBCUTANEOUS | Status: DC
Start: 2020-08-04 — End: 2020-08-04

## 2020-08-04 MED ORDER — HEPARIN SODIUM (PORCINE) 5000 UNIT/ML IJ SOLN
5000.0000 [IU] | Freq: Two times a day (BID) | INTRAMUSCULAR | Status: DC
Start: 2020-08-04 — End: 2020-08-06
  Administered 2020-08-04 – 2020-08-06 (×4): 5000 [IU] via SUBCUTANEOUS
  Filled 2020-08-04 (×4): qty 1

## 2020-08-04 MED ORDER — MELATONIN 3 MG PO TABS
3.0000 mg | ORAL_TABLET | Freq: Every evening | ORAL | Status: DC | PRN
Start: 2020-08-04 — End: 2020-08-06

## 2020-08-04 NOTE — ED Provider Notes (Signed)
[facility]    EMERGENCY DEPARTMENT HISTORY AND PHYSICAL EXAM    Date: 08/04/2020  Patient Name: Bridget [name]  Attending Physician: Lucille Passy, MD  Patient DOB:  1996/07/10  MRN:  [id number]  Room:  H 1/H 1    Patient was evaluated by ED physician, Dr Lourdes Sledge, at 1:41 PM    History     Chief Complaint   Patient presents with   . Emesis          The patient Bridget [name], is a 24 y.o. female who presents with history of Ehlers-Danlos syndrome, cyclic vomiting syndrome, gastroparesis, POTS who was just discharged Saturday 2 days ago from the hospital for vomiting and dehydration who again presents today with vomiting.  She went home on day 1 and had only one episode of vomiting was able to hold down her broth.  However she had a rise to this last night and then started vomiting numerous times.  A couple of the episodes of vomiting have specks of blood in them.  No blood or black in stool.  No fever or chills.  She talks about epigastric and right upper quadrant pain.  Abdominal pain sometimes radiates to the right side of the back.  No vaginal symptoms.  No UTI symptoms.  No fever or chills    PCP:  Reynold Bowen, MD      Past Medical History       Past Medical History:   Diagnosis Date   . Abdominal pain    . Asthma     mild seasonal controlled   . Complication of anesthesia    . Cyclical vomiting    . Ehlers-Danlos disease    . Gastroparesis    . Post-operative nausea and vomiting    . POTS (postural orthostatic tachycardia syndrome)     [facility] [place]/stable for many yrs   . Sinus trouble     seasonal   . UTI (urinary tract infection)    . Yeast infection involving the vagina and surrounding area 05/09/2015         Past Surgical History       Past Surgical History:   Procedure Laterality Date   . ADENOIDECTOMY     . EGD N/A 01/23/2018    Procedure: EGD;  Surgeon: Jacqulyn Cane, MD;  Location: [facility];  Service: Gastroenterology;   Laterality: N/A;  EGD  Q1=N/A   . TONSILLECTOMY     . WISDOM TOOTH EXTRACTION           Family History    Family History   Problem Relation Age of Onset   . Cancer Mother    . Ehlers-Danlos syndrome Sister    . Asthma Brother        Social History    Social History     Socioeconomic History   . Marital status: Single     Spouse name: None   . Number of children: None   . Years of education: None   . Highest education level: None   Occupational History   . None   Tobacco Use   . Smoking status: Never Smoker   . Smokeless tobacco: Never Used   . Tobacco comment: the patient vapes nicotine    Vaping Use   . Vaping Use: Every day   . Start date: 04/14/2018   Substance and Sexual Activity   . Alcohol use: Not Currently   . Drug use:  Yes     Types: Marijuana     Comment: Pt. uses marijuana every day.   Marland Kitchen Sexual activity: Never     Comment: takes the Pill continuously   Other Topics Concern   . Poor school performance No   . International travel in past 12 mos No   Social History Narrative   . None     Social Determinants of Health     Financial Resource Strain: Low Risk    . Difficulty of Paying Living Expenses: Not hard at all   Food Insecurity: No Food Insecurity   . Worried About Programme researcher, broadcasting/film/video in the Last Year: Never true   . Ran Out of Food in the Last Year: Never true   Transportation Needs: No Transportation Needs   . Lack of Transportation (Medical): No   . Lack of Transportation (Non-Medical): No   Physical Activity:    . Days of Exercise per Week: Not on file   . Minutes of Exercise per Session: Not on file   Stress:    . Feeling of Stress : Not on file   Social Connections:    . Frequency of Communication with Friends and Family: Not on file   . Frequency of Social Gatherings with Friends and Family: Not on file   . Attends Religious Services: Not on file   . Active Member of Clubs or Organizations: Not on file   . Attends Banker Meetings: Not on file   . Marital Status: Not on file   Intimate  Partner Violence:    . Fear of Current or Ex-Partner: Not on file   . Emotionally Abused: Not on file   . Physically Abused: Not on file   . Sexually Abused: Not on file   Housing Stability:    . Unable to Pay for Housing in the Last Year: Not on file   . Number of Places Lived in the Last Year: Not on file   . Unstable Housing in the Last Year: Not on file       Allergies    Allergies   Allergen Reactions   . Haloperidol      Interacts with reglan and also pt had involuntary eye movements.    . Latex Itching     Itching irritation swelling condoms  Itching irritation swelling condoms  Itching irritation swelling condoms   . Reglan [Metoclopramide]    . Morphine Rash         Current/Home Medications    Current/Home Medications    CARAWAY OIL-LEVOMENTHOL (FDGARD PO)    Take by mouth    CLONIDINE (CATAPRES) 0.1 MG TABLET    Take 0.05 mg by mouth nightly    DICYCLOMINE (BENTYL) 10 MG CAPSULE    Take 2 capsules (20 mg total) by mouth 4 (four) times daily as needed (abdominal pain/cramps)    DIPHENHYDRAMINE (BENADRYL) 50 MG TABLET    Take 50 mg by mouth nightly as needed    DRONABINOL (MARINOL) 2.5 MG CAPSULE    Take 1 capsule (2.5 mg total) by mouth 2 (two) times daily    ERYTHROMYCIN 250 MG TABLET DELAYED RESPONSE    1 tab Orally Four times a day 14 days    NORETHINDRONE-ETHINYL ESTRADIOL (ORTHO-NOVUM 1/35) 1-35 MG-MCG PER TABLET    Take 1 tablet by mouth daily    OMEPRAZOLE (PRILOSEC) 40 MG CAPSULE    daily       ONDANSETRON (ZOFRAN-ODT) 4 MG DISINTEGRATING TABLET  Take 1 tablet (4 mg total) by mouth every 6 (six) hours as needed for Nausea    PROCHLORPERAZINE (COMPAZINE) 5 MG TABLET    Take 1 tablet (5 mg total) by mouth every 6 (six) hours as needed for Nausea    PROMETHAZINE (PHENERGAN) 25 MG SUPPOSITORY    Place 1 suppository (25 mg total) rectally every 6 (six) hours as needed for Nausea    PROMETHAZINE (PHENERGAN) 25 MG SUPPOSITORY    Place 1 suppository (25 mg total) rectally every 6 (six) hours as needed for  Nausea    PYRIDOSTIGMINE (MESTINON) 60 MG TABLET    Take 60 mg by mouth 2 (two) times daily       Vital Signs     BP (!) 125/96   Pulse (!) 115   Temp 98.4 F (36.9 C) (Oral)   Resp 18   Wt (!) 44.4 kg   SpO2 98%   BMI 16.80 kg/m   Patient Vitals for the past 24 hrs:   BP Temp Temp src Pulse Resp SpO2 Weight   08/04/20 1204 (!) 125/96 98.4 F (36.9 C) Oral (!) 115 - 98 % -   08/04/20 1201 - - - - 18 - (!) 44.4 kg         Review of Systems   Review of Systems   Gastrointestinal: Positive for abdominal pain and vomiting. Negative for blood in stool, diarrhea and melena.   Musculoskeletal: Positive for back pain.   All other systems reviewed and are negative.      Physical Exam          CONSTITUTIONAL   Vital signs reviewed, Patient appears tired w tachycardia on vs Alert and oriented X 3.  HEAD   Atraumatic, Normocephalic.  EYES   Eyes are normal to inspection, Pupils equal, round and reactive to light, No discharge from eyes, Extraocular muscles intact, Sclera are normal, Conjunctiva normal.  ENT Mouth normal to Inspection.  NECK   Normal ROM. No jugular venous distention, No meningeal signs.  RESPIRATORY Chest is nontender, Breath sounds normal, No respiratory distress  CARDIOVASCULAR   RRR, No murmurs, Normal S1 S2, Rhythm is normal.  ABDOMEN   Abdomen is mildly ttp to epigastrium and ruq, No masses, Bowel sounds normal, No distension, No peritoneal signs.  BACK  There is no CVA Tenderness, Normal inspection.  UPPER EXTREMITY   Inspection normal, No cyanosis, No edema.  LOWER EXTREMITY   Inspection normal, No cyanosis, No edema.  NEURO   GCS is 15, Speech normal. 5/5 ms and Nl sensation B. No cerebellar deficits. Nl cranial exam  SKIN Skin is warm, Skin is dry, Skin is normal color.  LYMPHATIC   No adenopathy in neck.  PSYCHIATRIC Oriented X 3, Normal affect.         ED Medication Orders     ED Medication Orders (From admission, onward)    Start Ordered     Status Ordering Provider    08/04/20 1226  08/04/20 1226  pantoprazole (PROTONIX) injection 40 mg  Once        Route: Intravenous  Ordered Dose: 40 mg     Last MAR action: Given Demarius Archila A    08/04/20 1220 08/04/20 1219  ondansetron (ZOFRAN) injection 4 mg  Once        Route: Intravenous  Ordered Dose: 4 mg     Last MAR action: Given Braiden Presutti A    08/04/20 1220 08/04/20 1220  sodium chloride 0.9 %  bolus 1,000 mL  Once        Route: Intravenous  Ordered Dose: 1,000 mL     Last MAR action: New Bag Pablo Mathurin A          Orders Placed During this Encounter     Orders Placed This Encounter   Procedures   . CBC and differential   . Basic Metabolic Panel   . Beta HCG, Qual, Serum   . GFR   . UA Reflex to Micro - Reflex to Culture   . Hepatic function panel (LFT)   . Lipase       Diagnostic Study Results     Labs     Results     Procedure Component Value Units Date/Time    Lipase [[id number]] Collected: 08/04/20 1254     Updated: 08/04/20 1349     Lipase 44 U/L     Hepatic function panel (LFT) [[id number]]  (Abnormal) Collected: 08/04/20 1254     Updated: 08/04/20 1349     Bilirubin, Total 1.1 mg/dL      Bilirubin Direct 0.3 mg/dL      Bilirubin Indirect 0.8 mg/dL      AST (SGOT) 18 U/L      ALT 18 U/L      Alkaline Phosphatase 67 U/L      Protein, Total 8.5 g/dL      Albumin 5.2 g/dL      Globulin 3.3 g/dL      Albumin/Globulin Ratio 1.6    Basic Metabolic Panel [[id number]]  (Abnormal) Collected: 08/04/20 1254    Specimen: Blood Updated: 08/04/20 1330     Glucose 89 mg/dL      BUN 13 mg/dL      Creatinine 0.9 mg/dL      Calcium 87.5 mg/dL      Sodium 643 mEq/L      Potassium 3.5 mEq/L      Chloride 95 mEq/L      CO2 22 mEq/L      Anion Gap 19.0    GFR [[id number]] Collected: 08/04/20 1254     Updated: 08/04/20 1330     EGFR >60.0    UA Reflex to Micro - Reflex to Culture [[id number]]  (Abnormal) Collected: 08/04/20 1254     Updated: 08/04/20 1324     Urine Type Urine, Clean Ca     Color, UA Yellow     Clarity, UA Clear     Specific Gravity UA 1.027     Urine  pH 6.0     Leukocyte Esterase, UA Trace     Nitrite, UA Negative     Protein, UR 100     Glucose, UA Negative     Ketones UA 80     Urobilinogen, UA 4.0 mg/dL      Bilirubin, UA Negative     Blood, UA Negative     RBC, UA 3 - 5 /hpf      WBC, UA 0 - 5 /hpf      Squamous Epithelial Cells, Urine 0 - 5 /hpf      Urine Mucus Present    Beta HCG, Qual, Serum [[id number]] Collected: 08/04/20 1254    Specimen: Blood Updated: 08/04/20 1321     Hcg Qualitative Negative    CBC and differential [[id number]]  (Abnormal) Collected: 08/04/20 1254    Specimen: Blood Updated: 08/04/20 1303     WBC 10.77 x10 3/uL      Hgb 17.0 g/dL  Hematocrit 50.3 %      Platelets 415 x10 3/uL      RBC 5.65 x10 6/uL      MCV 89.0 fL      MCH 30.1 pg      MCHC 33.8 g/dL      RDW 13 %      MPV 9.5 fL      Neutrophils 69.6 %      Lymphocytes Automated 22.4 %      Monocytes 6.7 %      Eosinophils Automated 0.3 %      Basophils Automated 0.6 %      Immature Granulocytes 0.4 %      Nucleated RBC 0.0 /100 WBC      Neutrophils Absolute 7.51 x10 3/uL      Lymphocytes Absolute Automated 2.41 x10 3/uL      Monocytes Absolute Automated 0.72 x10 3/uL      Eosinophils Absolute Automated 0.03 x10 3/uL      Basophils Absolute Automated 0.06 x10 3/uL      Immature Granulocytes Absolute 0.04 x10 3/uL      Absolute NRBC 0.00 x10 3/uL           Radiologic Studies  Radiology Results (24 Hour)     ** No results found for the last 24 hours. **      .    Clinical Course / MDM       Notes:      MDM: Patient with continued vomiting and dehydration after discharge rest 36 hours ago.  Labs have evidence of worsening dehydration.  Given the fact that the patient is getting worse with his abnormalities that were not there 3 days ago I recommend the patient be admitted for hydration and GI consult  Discussion of abnormal results/incidental findings:       Consults:  D/w [facility] (1340) and GI health (Helen APP) and they agree for admission and GI will consult    Data Review      Nursing records reviewed and agree: Yes    Pulse Oximetry Analysis - Normal  Laboratory results reviewed by EDP: Yes  Radiologic study results reviewed by EDP: Yes    Rendering Provider: Dr Lourdes Sledge    Monitors, EKG     Cardiac Monitor (interpreted by ED physician):      EKG (interpreted by ED physician):       Critical Care     Critical care exclusive of time spent performing procedures.    Total time:          Clinical Impression & Disposition     Clinical Impression:  1. Dehydration    2. Right upper quadrant abdominal pain    3. Tachycardia        Disposition  ED Disposition     None          Prescriptions    New Prescriptions    No medications on file              Lucille Passy, MD  08/04/20 1353

## 2020-08-04 NOTE — Progress Notes (Signed)
Admission Note-   Patient admitted to Medical#  [id number] at 1530. Hand off report received from n/a.     Patient oriented to room, bed in lowest position, bed alarm activated, call bell within reach.    Fall prevention contract completed. Yes/No   Patient informed of visitor policy.   Belongings See Flowsheets .     Brief Clinical Picture:  Neuro AOx4; Resp - O2 RA;  Skin Assessment  Two nurse skin assessment performed with: Irving Burton RN     Braden score <15. No

## 2020-08-04 NOTE — Plan of Care (Signed)
Problem: Side Effects from Pain Analgesia  Goal: Patient will experience minimal side effects of analgesic therapy  Outcome: Progressing  Flowsheets (Taken 08/04/2020 1855)  Patient will experience minimal side effects of analgesic therapy:   Monitor/assess patient's respiratory status (RR depth, effort, breath sounds)   Assess for changes in cognitive function     Problem: Psychosocial and Spiritual Needs  Goal: Demonstrates ability to cope with hospitalization/illness  Outcome: Progressing  Flowsheets (Taken 08/04/2020 1855)  Demonstrates ability to cope with hospitalizations/illness:   Encourage verbalization of feelings/concerns/expectations   Provide quiet environment   Assist patient to identify own strengths and abilities   Encourage patient to set small goals for self   Encourage participation in diversional activity

## 2020-08-04 NOTE — Pharmacy Admission Med History Advanced (Addendum)
Medication Reconciliation for Admission    Patient's Pharmacy Information:    Primary Pharmacy Updated in Epic: No    Preferred pharmacy:   [facility] - [place], [place] - [address] [place] [address]  Phone: [phone] Fax: [phone]        Primary Source of Medication History: Mother and Self  ** Marinol is new and patient hasn't taken any yet.    Patient demonstrates Good compliance with medications.  Patient demonstrates Good knowledge of medications.     Medication reconciliation by physician has not occurred already.    Prior to Admission Medication List:  Home Medications       Med List Status: Pharmacy Completed Set By: Heloise Ochoa, RPH at 08/04/2020  2:28 PM                  calcium carbonate (TUMS) 500 MG chewable tablet     Chew 1 tablet by mouth daily as needed for Heartburn     cloNIDine (CATAPRES) 0.1 MG tablet     Take 0.05 mg by mouth nightly     dicyclomine (BENTYL) 10 MG capsule     Take 2 capsules (20 mg total) by mouth 4 (four) times daily as needed (abdominal pain/cramps)     dronabinol (MARINOL) 2.5 MG capsule     Take 1 capsule (2.5 mg total) by mouth 2 (two) times daily     Erythromycin 250 MG Tablet Delayed Response     1 tab Orally Four times a day 14 days     norethindrone-ethinyl estradiol (ORTHO-NOVUM 1/35) 1-35 MG-MCG per tablet     Take 1 tablet by mouth daily     omeprazole (PriLOSEC) 40 MG capsule     daily        ondansetron (ZOFRAN-ODT) 4 MG disintegrating tablet     Take 1 tablet (4 mg total) by mouth every 6 (six) hours as needed for Nausea     prochlorperazine (COMPAZINE) 5 MG tablet     Take 1 tablet (5 mg total) by mouth every 6 (six) hours as needed for Nausea     promethazine (PHENERGAN) 25 MG suppository     Place 1 suppository (25 mg total) rectally every 6 (six) hours as needed for Nausea     pyridostigmine (MESTINON) 60 MG tablet     Take 60 mg by mouth 2 (two) times daily                        Ancil Boozer [place], [facility]

## 2020-08-04 NOTE — H&P (Signed)
[facility] Practice Admission H&P  Physician Available 24 hours a day:  Please contact team using the phone number in the sticky note, or Epic chat the residents.  Office#: [phone]       Date Time: 08/04/20 3:29 PM  Patient Name: Bridget [name]  Attending Physician: Barnie Alderman, MD  Primary Care Physician: Reynold Bowen, MD    CC: Intractable nausea and vomiting       Assessment:     Active Hospital Problems    Diagnosis   . Dehydration       24 y.o. female with known history of asthma, Ehlers-Danlos, POTS, cyclic vomiting syndrome and possible gastroparesis, admitted with dehydration 2/2 intractable vomiting, having been seen in ED multiple times this past week for the same.     Plan:   #Intractable nausea and vomiting  Has been seen in the ED for this problem 4 times in the past week. Possibly 2/2 marijuana overuse, gastroparesis may be contributing  - Will trial amitriptyline 10mg  QHS per GI recs  - Will continue dronabinol 2.5mg  BID as prescribed on discharge two days ago, pt has not yet trialed at home   - Continue erythromycin 250mg  BID   - Protonix substituted for home prilosec, increased frequency to protonix 40mg  BID per GI recs  - Tums 500mg  daily PRN  - Compazine 5mg  Q6h PRN  - Phenergan 25mg  rectally Q6h PRN  - Zofran 4mg  Q6h PRN  - s/p NS IVF in ED, continue mIVF with LR @75cc /hr  - Clear liquid diet, advance as tolerated   - GI consulted appreciate recs   - Will avoid Reglan and Haldol as pt reports adverse reactions to these in the past    #POTS  - Stable, asymptomatic  - Pt followed by cardiology Dr. Cleotis Lema  - Previously taking clonidine, pt reports has not been taking this recently     #FEN/GI   - mIVF: LR @75cc /hr  - Diet: clear liquid, advance as tolerated      #PPX  - DVT: SCDs, SC lovenox  - GI: PPI    CODE STATUS: Full Code     DISPO:  Based on the information available on the day of this admission, patient will be admitted to service status:  Observation:patient is  stable and improving.   Anticipated medical stability for discharge:Yellow - maybe tomorrow  Anticipated discharge needs: TBD    History of Presenting Illness:   Bridget [name] is a 24 y.o. female with known history of asthma, Ehlers-Danlos, POTS, cyclic vomiting syndrome and possible gastroparesis, admitted with dehydration 2/2 intractable vomiting. This is patient's fourth ED visit this week for these same symptoms, after third presentation, she was admitted for IV hydration and observation overnight, with improvement after IVF and antiemetics. Patient has struggled with nausea and cyclic vomiting for several years, and has been dealing with chronically poor appetite and weight loss.     Pt has been self-medicating with marijuana, stating that smoking marijuana helps both with appetite stimulation and to curb her nausea. She has been instructed several times in the past by PCP and GI that marijuana may be contributing to her cyclic vomiting, and she understands this well. She has tried multiple times in the past to stop smoking, but when she stops, the nausea/vomiting worsens and her appetite is very poor.     Pt reports that when she doesn't eat she then becomes nauseous, and the worse her nausea is, the more likely she  will vomit whether or not she eats. She takes zofran at home for mild to moderate nausea as needed, and additionally takes phenergan PRN for nausea. She was previously taking Reglan, but this caused tardive dyskinesia symptoms so she stopped taking it. She was also prescribed erythromycin 4x daily to help with nausea and gastric emptying, but only takes this twice daily and was advised by GI to continue BID dosing.     During last admission, pt was prescribed dronabinol 2.5mg  BID for appetite stimulation in hopes of replacing marijuana, but pt has not yet taken this medication since discharge. Her last marijuana use was one week ago. Pt has been having issues staying ahead of her nausea at  work, as she is unable to smoke at or before work, which means her nausea more frequently gets out of control.     Pt also notes her nausea and vomiting are worse around the time of her period. Pt currently on OCPs, continuously takes hormone pills and skips placebo for upwards of 6 months to avoid periods. She had an IUD in the past but reacted badly to it, and had to have it taken out. She does note that she has always had worsened nausea/vomiting spells at the time of her menstrual cycle.     Patient is followed by Dr. Hayden Rasmussen [facility] and Dr. Trula Slade with [facility]. [facility] would like to do a gastric emptying study with her and potentially trial domperidone if she is shown to have gastroparesis. [facility] recommending trial of TCA, possible BID dosing of PPI, and additionally recommends possible domperidone trial.     SPECIALISTS:   GI: Dr. Hayden Rasmussen [facility]); Dr. Trula Slade [facility])   Cardiology: Dr. Cleotis Lema        Past Medical History:     Past Medical History:   Diagnosis Date   . Abdominal pain    . Asthma     mild seasonal controlled   . Complication of anesthesia    . Cyclical vomiting    . Ehlers-Danlos disease    . Gastroparesis    . Post-operative nausea and vomiting    . POTS (postural orthostatic tachycardia syndrome)     [facility]/stable for many yrs   . Sinus trouble     seasonal   . UTI (urinary tract infection)    . Yeast infection involving the vagina and surrounding area 05/09/2015       Past Surgical History:     Past Surgical History:   Procedure Laterality Date   . ADENOIDECTOMY     . EGD N/A 01/23/2018    Procedure: EGD;  Surgeon: Jacqulyn Cane, MD;  [facility];  Service: Gastroenterology;  Laterality: N/A;  EGD  Q1=N/A   . TONSILLECTOMY     . WISDOM TOOTH EXTRACTION         Family History:     Family History   Problem Relation Age of Onset   . Cancer Mother    . Ehlers-Danlos syndrome Sister    . Asthma  Brother        Social History:     Social History     Socioeconomic History   . Marital status: Single     Spouse name: Not on file   . Number of children: Not on file   . Years of education: Not on file   . Highest education level: Not on file   Occupational History   . Not  on file   Tobacco Use   . Smoking status: Never Smoker   . Smokeless tobacco: Never Used   . Tobacco comment: the patient vapes nicotine    Vaping Use   . Vaping Use: Every day   . Start date: 04/14/2018   Substance and Sexual Activity   . Alcohol use: Not Currently   . Drug use: Yes     Types: Marijuana     Comment: Pt. uses marijuana every day.   Marland Kitchen Sexual activity: Never     Comment: takes the Pill continuously   Other Topics Concern   . Poor school performance No   . International travel in past 12 mos No   Social History Narrative   . Not on file     Social Determinants of Health     Financial Resource Strain: Low Risk    . Difficulty of Paying Living Expenses: Not hard at all   Food Insecurity: No Food Insecurity   . Worried About Programme researcher, broadcasting/film/video in the Last Year: Never true   . Ran Out of Food in the Last Year: Never true   Transportation Needs: No Transportation Needs   . Lack of Transportation (Medical): No   . Lack of Transportation (Non-Medical): No   Physical Activity:    . Days of Exercise per Week: Not on file   . Minutes of Exercise per Session: Not on file   Stress:    . Feeling of Stress : Not on file   Social Connections:    . Frequency of Communication with Friends and Family: Not on file   . Frequency of Social Gatherings with Friends and Family: Not on file   . Attends Religious Services: Not on file   . Active Member of Clubs or Organizations: Not on file   . Attends Banker Meetings: Not on file   . Marital Status: Not on file   Intimate Partner Violence:    . Fear of Current or Ex-Partner: Not on file   . Emotionally Abused: Not on file   . Physically Abused: Not on file   . Sexually Abused: Not on file    Housing Stability:    . Unable to Pay for Housing in the Last Year: Not on file   . Number of Places Lived in the Last Year: Not on file   . Unstable Housing in the Last Year: Not on file       Allergies:     Allergies   Allergen Reactions   . Haloperidol      Interacts with reglan and also pt had involuntary eye movements.    . Latex Itching     Itching irritation swelling condoms  Itching irritation swelling condoms  Itching irritation swelling condoms   . Reglan [Metoclopramide]    . Morphine Rash       Medications:     Current Discharge Medication List      CONTINUE these medications which have NOT CHANGED    Details   calcium carbonate (TUMS) 500 MG chewable tablet Chew 1 tablet by mouth daily as needed for Heartburn      cloNIDine (CATAPRES) 0.1 MG tablet Take 0.05 mg by mouth nightly      dicyclomine (BENTYL) 10 MG capsule Take 2 capsules (20 mg total) by mouth 4 (four) times daily as needed (abdominal pain/cramps)  Qty: 90 capsule, Refills: 1      Erythromycin 250 MG Tablet Delayed Response 1 tab  Orally Four times a day 14 days      norethindrone-ethinyl estradiol (ORTHO-NOVUM 1/35) 1-35 MG-MCG per tablet Take 1 tablet by mouth daily  Qty: 84 tablet, Refills: 3    Associated Diagnoses: Encounter for surveillance of contraceptive pills      omeprazole (PriLOSEC) 40 MG capsule daily         ondansetron (ZOFRAN-ODT) 4 MG disintegrating tablet Take 1 tablet (4 mg total) by mouth every 6 (six) hours as needed for Nausea  Qty: 12 tablet, Refills: 0      prochlorperazine (COMPAZINE) 5 MG tablet Take 1 tablet (5 mg total) by mouth every 6 (six) hours as needed for Nausea  Qty: 12 tablet, Refills: 0      promethazine (PHENERGAN) 25 MG suppository Place 1 suppository (25 mg total) rectally every 6 (six) hours as needed for Nausea  Qty: 12 suppository, Refills: 1      pyridostigmine (MESTINON) 60 MG tablet Take 60 mg by mouth 2 (two) times daily      dronabinol (MARINOL) 2.5 MG capsule Take 1 capsule (2.5 mg total)  by mouth 2 (two) times daily  Qty: 60 capsule, Refills: 0              Review of Systems:   All other systems were reviewed and are negative except: per HPI    Physical Exam:     VITAL SIGNS: Physical Exam:   Temp:  [98.1 F (36.7 C)-98.4 F (36.9 C)] 98.1 F (36.7 C)  Heart Rate:  [86-115] 97  Resp Rate:  [16-18] 16  BP: (120-132)/(76-96) 132/80  Body mass index is 16.8 kg/m.  No intake or output data in the 24 hours ending 08/04/20 1529 General: awake, alert, oriented, and in no apparent stress  HEENT: atraumatic, normocephalic, PERRL, no scleral icterus, mucus membranes moist and pink  Chest: RRR, no murmurs  Pulm: CTAB, no wheezes/rales/rhonchi  Abdomen: soft, mild RUQ and epigastric tenderness, nondistended  MSK: moving all extremities, no tenderness, no edema  Skin: no rash, discoloration, or suspicious lesions  Neuro: no FND, CN grossly intact  Psych: normal mood and affect             Labs Reviewed:     Results     Procedure Component Value Units Date/Time    Lipase [[id number]] Collected: 08/04/20 1254     Updated: 08/04/20 1349     Lipase 44 U/L     Hepatic function panel (LFT) [[id number]]  (Abnormal) Collected: 08/04/20 1254     Updated: 08/04/20 1349     Bilirubin, Total 1.1 mg/dL      Bilirubin Direct 0.3 mg/dL      Bilirubin Indirect 0.8 mg/dL      AST (SGOT) 18 U/L      ALT 18 U/L      Alkaline Phosphatase 67 U/L      Protein, Total 8.5 g/dL      Albumin 5.2 g/dL      Globulin 3.3 g/dL      Albumin/Globulin Ratio 1.6    Basic Metabolic Panel [[id number]]  (Abnormal) Collected: 08/04/20 1254    Specimen: Blood Updated: 08/04/20 1330     Glucose 89 mg/dL      BUN 13 mg/dL      Creatinine 0.9 mg/dL      Calcium 72.5 mg/dL      Sodium 366 mEq/L      Potassium 3.5 mEq/L      Chloride 95 mEq/L  CO2 22 mEq/L      Anion Gap 19.0    GFR [[id number]] Collected: 08/04/20 1254     Updated: 08/04/20 1330     EGFR >60.0    UA Reflex to Micro - Reflex to Culture [[id number]]  (Abnormal) Collected: 08/04/20 1254      Updated: 08/04/20 1324     Urine Type Urine, Clean Ca     Color, UA Yellow     Clarity, UA Clear     Specific Gravity UA 1.027     Urine pH 6.0     Leukocyte Esterase, UA Trace     Nitrite, UA Negative     Protein, UR 100     Glucose, UA Negative     Ketones UA 80     Urobilinogen, UA 4.0 mg/dL      Bilirubin, UA Negative     Blood, UA Negative     RBC, UA 3 - 5 /hpf      WBC, UA 0 - 5 /hpf      Squamous Epithelial Cells, Urine 0 - 5 /hpf      Urine Mucus Present    Beta HCG, Qual, Serum [[id number]] Collected: 08/04/20 1254    Specimen: Blood Updated: 08/04/20 1321     Hcg Qualitative Negative    CBC and differential [[id number]]  (Abnormal) Collected: 08/04/20 1254    Specimen: Blood Updated: 08/04/20 1303     WBC 10.77 x10 3/uL      Hgb 17.0 g/dL      Hematocrit 62.9 %      Platelets 415 x10 3/uL      RBC 5.65 x10 6/uL      MCV 89.0 fL      MCH 30.1 pg      MCHC 33.8 g/dL      RDW 13 %      MPV 9.5 fL      Neutrophils 69.6 %      Lymphocytes Automated 22.4 %      Monocytes 6.7 %      Eosinophils Automated 0.3 %      Basophils Automated 0.6 %      Immature Granulocytes 0.4 %      Nucleated RBC 0.0 /100 WBC      Neutrophils Absolute 7.51 x10 3/uL      Lymphocytes Absolute Automated 2.41 x10 3/uL      Monocytes Absolute Automated 0.72 x10 3/uL      Eosinophils Absolute Automated 0.03 x10 3/uL      Basophils Absolute Automated 0.06 x10 3/uL      Immature Granulocytes Absolute 0.04 x10 3/uL      Absolute NRBC 0.00 x10 3/uL             Imaging Reviewed:     No results found.      Signed by: Irving Shows, MD  [place]: [phone]  [place]: [phone]  Office #: [phone]     GL:OVFIEP, Orma Render, MD

## 2020-08-04 NOTE — ED Triage Notes (Signed)
Vomiting since d/c from [facility] for gastroparesis on Saturday

## 2020-08-04 NOTE — EDIE (Signed)
COLLECTIVE?NOTIFICATION?08/04/2020 11:50?Bridget [name], Bridget [name]?MRN: [id number]    Criteria Met      5 ED Visits in 12 Months    Security and Safety  No recent Security Events currently on file    ED Care Guidelines  There are currently no ED Care Guidelines for this patient. Please check your facility's medical records system.    Flags      Negative COVID-19 Lab Result - [facility] - [name] specimen collected from this patient was negative for COVID-19 / Attributed By: [place] Department of Health / Attributed On: 07/26/2020       Prescription Monitoring Program  000??- Narcotic Use Score  000??- Sedative Use Score  000??- Stimulant Use Score  000??- Overdose Risk Score  - All Scores range from 000-999 with 75% of the population scoring < 200 and on 1% scoring above 650  - The last digit of the narcotic, sedative, and stimulant score indicates the number of active prescriptions of that type  - Higher Use scores correlate with increased prescribers, pharmacies, mg equiv, and overlapping prescriptions  - Higher Overdose Risk Scores correlate with increased risk of unintentional overdose death   Concerning or unexpectedly high scores should prompt [name] review of the PMP record; this does not constitute checking PMP for prescribing purposes.      E.D. Visit Count (12 mo.)  Facility Visits   [place] [facility] 8   [place] [facility]: [facility]) 1   [place] [name] 2   Total 11   Note: Visits indicate total known visits.     Recent Emergency Department Visit Summary  Showing 10 most recent visits out of 11 in the past 12 months  Date Facility [facility] Type Diagnoses or Chief Complaint   Aug 04, 2020 [facility] [place] [facility] [place] [facility]      Emesis      Aug 01, 2020 [facility] [place] [facility] [place] [facility]      Emesis      Nausea with vomiting, unspecified      Cyclical vomiting syndrome unrelated to migraine      Jul 31, 2020 [place] [facility] [place] [facility] [place] [facility]      Emesis; Nausea      Emesis      Nausea with  vomiting, unspecified      Jul 30, 2020 [facility] [place] [facility] [place] [facility]      Nausea; Vomitting      Emesis      Cyclical vomiting syndrome unrelated to migraine      Dehydration      Apr 06, 2020 [facility] [place] [facility] [place] [facility]      N/V;abd pain      Abdominal Pain      Dehydration      Cyclical vomiting syndrome unrelated to migraine      Apr 04, 2020 [place] [facility]: [facility] [place] [place] [facility]      weakness; not able to keep food or liquid down      Emesis      Abdominal Pain      Acidosis      Other chronic pain      Elevated blood-pressure reading, without diagnosis of hypertension      Cyclical vomiting syndrome unrelated to migraine      Generalized abdominal pain      Nausea with vomiting, unspecified      Other disorders of electrolyte and fluid balance, not elsewhere classified  Apr 02, 2020 [facility] [place] [facility] [place] [facility]      ABD PAIN- emesis      Abdominal Pain      Cyclical vomiting syndrome unrelated to migraine      Apr 01, 2020 [facility] [place] [facility] [place] [facility]      Emesis      Nausea      Cyclical vomiting syndrome unrelated to migraine      Mar 31, 2020 [facility] [place] [facility]      Triage      Abdominal Pain      Emesis      Cyclical vomiting syndrome unrelated to migraine      Feb 05, 2020 [facility] [place] [facility] [place] [facility]      emesis;weakness      Nausea      Nausea with vomiting, unspecified      Generalized abdominal pain          Recent Inpatient Visit Summary  Date Facility [facility] Type Diagnoses or Chief Complaint   Jan 30, 2020 [place] [facility] [place] [facility]      Nausea with vomiting, unspecified          Care Team  Provider Specialty Phone Fax Service Dates   Wynne Dust, MD Pediatrics [phone] Current      Collective Portal  This patient has registered at the [facility] Emergency Department   For more information visit:  [email or web address]     PLEASE NOTE:     1.   Any care recommendations and other clinical information are provided as guidelines or for historical purposes only, and providers should exercise their own clinical judgment when providing care.    2.   You may only use this information for purposes of treatment, payment or health care operations activities, and subject to the limitations of applicable Collective Policies.    3.   You should consult directly with the organization that provided [name] care guideline or other clinical history with any questions about additional information or accuracy or completeness of information provided.    ? 2021 [place], [employer] - [email or web address]

## 2020-08-04 NOTE — UM Notes (Signed)
PATIENT NAME: [name],Bridget [name]   PATIENT DOB: Dec 24, 1995, 24 y.o./24 y.o., female   DISCHARGE DATE NOTIFICATION   PATIENT DISCHARGED ON: 08/02/2020 TO Home or Self Care     Is OBS stay approved? Do you need additional info?    Adult Admit to Observation [[id number]]      Please provide authorization or call with questions.      Wynell Balloon RN CM  [place]  [phone]    Wynell Balloon, RN, BSN, ACM  Clinical Case Manager   [facility]   [address]   [place], [place] [address]  [phone]  Direct Line  [phone] CM Department  [phone] VM for Insurer  [phone] Fax   [phone] Hospital Main Number    NPI: [id number]               Tax ID: [id number]    This clinical/utilization review is compiled from the documentation of the care team providers.

## 2020-08-04 NOTE — ED Notes (Signed)
[facility] EMERGENCY DEPARTMENT  ED NURSING NOTE FOR THE RECEIVING INPATIENT NURSE   ED NURSE Estrella Myrtle [phone]   ED CHARGE RN Jae Dire   ADMISSION INFORMATION   Bridget [name] is a 24 y.o. female admitted with an ED diagnosis of:    1. Dehydration    2. Right upper quadrant abdominal pain    3. Tachycardia         Isolation: None   Allergies: Haloperidol, Latex, Reglan [metoclopramide], and Morphine   Holding Orders confirmed? Yes   Belongings Documented? Yes   Home medications sent to pharmacy confirmed? N/A   NURSING CARE   Patient Comes From:   Mental Status: Home Independent  alert and oriented   ADL: Independent with all ADLs   Ambulation: no difficulty   Pertinent Information  and Safety Concerns: some dizziness while ambulating      CT / NIH   CT Head ordered on this patient?  N/A   NIH/Dysphagia assessment done prior to admission? N/A   VITAL SIGNS (at the time of this note)      Vitals:    08/04/20 1204   BP: (!) 125/96   Pulse: (!) 115   Resp:    Temp: 98.4 F (36.9 C)   SpO2: 98%

## 2020-08-04 NOTE — Consults (Signed)
GASTROHEALTH  CONSULTATION NOTE  [facility] call: [id number]  [facility] call: [phone]  After hours call [phone]        Date Time: 08/04/20 4:36 PM  Patient Name: Bridget [name]  Requesting Physician: Barnie Alderman, MD       Reason for Consultation:   Nausea/vomiting  Dehydration    Assessment and Plan:   Assessment:  1. Nausea and vomiting: History of cyclic vomiting syndrome and suspected underlying gastroparesis, currently unable to tolerate a diet since her discharge on Saturday. She notes her symptoms have improved today.    2. Dehydration    Plan:  1. Antiemetics, and IV fluids per primary team  2. Recommend discontinuation of recreational marijuana use  3. Could consider Amitriptyline 10mg  PO qHS for the cyclic vomiting syndrome  4. Consider increasing PPI to 40 mg PO/IV bid  5. Follow up outpatient with Dr.Tekola    History:   Bridget [name] is a 24 y.o. female history of Ehlers-Danlos syndrome, cyclic vomiting syndrome, gastroparesis, POTS who was just discharged Saturday 2 days ago from the hospital for vomiting and dehydration who presents to the hospital on 08/04/2020 with vomiting.  She went home on day 1 and had only one episode of vomiting was able to hold down her broth.  However since then she has had numerous vomiting episodes, with some specks of blood in them. Pt notes that her symptoms are worse when she in on her menses. She notes that she has been on Erythromycin, bid, which helped with her appetite in the past, but notes it has not been helping. She is also on omeprazole 20 mg, qd. She has tried Reglan in the past but it caused tardive dyskinesia, so she discontinued it. Pt denies melena, hematochezia, fever, chills. Her last EGD was on 01/23/18, which showed 1 cm HH, esophagitis, and gastritis (bx).She also has had an unremarkable GES on 01/30/18.     Past Medical History:     Past Medical History:   Diagnosis Date   . Abdominal pain    . Asthma      mild seasonal controlled   . Complication of anesthesia    . Cyclical vomiting    . Ehlers-Danlos disease    . Gastroparesis    . Post-operative nausea and vomiting    . POTS (postural orthostatic tachycardia syndrome)     [facility] [place]/stable for many yrs   . Sinus trouble     seasonal   . UTI (urinary tract infection)    . Yeast infection involving the vagina and surrounding area 05/09/2015       Past Surgical History:     Past Surgical History:   Procedure Laterality Date   . ADENOIDECTOMY     . EGD N/A 01/23/2018    Procedure: EGD;  Surgeon: Jacqulyn Cane, MD;  Location: [facility];  Service: Gastroenterology;  Laterality: N/A;  EGD  Q1=N/A   . TONSILLECTOMY     . WISDOM TOOTH EXTRACTION         Family History:     Family History   Problem Relation Age of Onset   . Cancer Mother    . Ehlers-Danlos syndrome Sister    . Asthma Brother        Social History:     Social History     Socioeconomic History   . Marital status: Single     Spouse name: Not on file   . Number of children: Not on  file   . Years of education: Not on file   . Highest education level: Not on file   Occupational History   . Not on file   Tobacco Use   . Smoking status: Never Smoker   . Smokeless tobacco: Never Used   . Tobacco comment: the patient vapes nicotine    Vaping Use   . Vaping Use: Every day   . Start date: 04/14/2018   Substance and Sexual Activity   . Alcohol use: Not Currently   . Drug use: Yes     Types: Marijuana     Comment: Pt. uses marijuana every day.   Marland Kitchen Sexual activity: Never     Comment: takes the Pill continuously   Other Topics Concern   . Poor school performance No   . International travel in past 12 mos No   Social History Narrative   . Not on file     Social Determinants of Health     Financial Resource Strain: Low Risk    . Difficulty of Paying Living Expenses: Not hard at all   Food Insecurity: No Food Insecurity   . Worried About Programme researcher, broadcasting/film/video in the Last Year: Never true    . Ran Out of Food in the Last Year: Never true   Transportation Needs: No Transportation Needs   . Lack of Transportation (Medical): No   . Lack of Transportation (Non-Medical): No   Physical Activity:    . Days of Exercise per Week: Not on file   . Minutes of Exercise per Session: Not on file   Stress:    . Feeling of Stress : Not on file   Social Connections:    . Frequency of Communication with Friends and Family: Not on file   . Frequency of Social Gatherings with Friends and Family: Not on file   . Attends Religious Services: Not on file   . Active Member of Clubs or Organizations: Not on file   . Attends Banker Meetings: Not on file   . Marital Status: Not on file   Intimate Partner Violence:    . Fear of Current or Ex-Partner: Not on file   . Emotionally Abused: Not on file   . Physically Abused: Not on file   . Sexually Abused: Not on file   Housing Stability:    . Unable to Pay for Housing in the Last Year: Not on file   . Number of Places Lived in the Last Year: Not on file   . Unstable Housing in the Last Year: Not on file       Allergies:     Allergies   Allergen Reactions   . Haloperidol      Interacts with reglan and also pt had involuntary eye movements.    . Latex Itching     Itching irritation swelling condoms  Itching irritation swelling condoms  Itching irritation swelling condoms   . Reglan [Metoclopramide]    . Morphine Rash       Medications:       Review of Systems:   General:  Patient denies lack of appetite, night sweats, weight loss, fatigue, fever.   HEENT:  Patient denies headache, hoarseness   Cardiovascular:  Patient denies swelling of hands/feet, fainting/blacking out, chest pain.   Respiratory:  Patient denies chronic cough, difficulty breathing, wheezing.   Genitourinary:  Patient denies blood in urine, dark urine  Musculoskeletal: Patient denies joint pain, joint stiffness, joint swelling.  Skin:  Patient denies itching, rash.   Neurologic:  Patient denies  dizziness, loss of consciousness, fainting, confusion  Heme/Lymphatic:  Patient denies easy bruising.       Pertinent positives noted in HPI.    Physical Exam:     Vitals:    08/04/20 1456   BP: 132/80   Pulse: 97   Resp: 16   Temp: 98.1 F (36.7 C)   SpO2: 100%       General appearance: Well developed, thin, appears stated age and in NAD  Eyes: Sclera anicteric, pink conjunctivae, no ptosis  ENMT: mucous membranes moist, nose and ears appear normal.  Oropharynx clear.  Chest: Non labored respirations, no audible wheezing, no clubbing or cyanosis  CV:  Regular rate and rhythm, no JVD, no LE edema  Abdomen: soft, non-tender, non-distended, no masses or organomegaly  Skin: Normal color and turgor, no rashes, no suspicious skin lesions noted  Neuro: CN II-XII grossly intact.  No gross movement disorders noted.  Mental status: Appropriate affect, alert and oriented x 3    Labs Reviewed:     Recent Labs     08/04/20  1254   WBC 10.77*   Hgb 17.0*   Hematocrit 50.3*   Platelets 415*   MCV 89.0       Recent Labs     08/04/20  1254   Sodium 136   Potassium 3.5   Chloride 95*   CO2 22   BUN 13   Creatinine 0.9   Glucose 89   Calcium 10.5       Recent Labs     08/04/20  1254   AST (SGOT) 18   ALT 18   Alkaline Phosphatase 67   Bilirubin, Total 1.1   Bilirubin Direct 0.3   Protein, Total 8.5*   Albumin 5.2*       No results for input(s): PTT, PT, INR in the last 72 hours.     Radiology:   Radiological Procedure reviewed:

## 2020-08-05 LAB — BASIC METABOLIC PANEL
Anion Gap: 14 (ref 5.0–15.0)
BUN: 6 mg/dL — ABNORMAL LOW (ref 7–19)
CO2: 24 mEq/L (ref 22–29)
Calcium: 9.5 mg/dL (ref 8.5–10.5)
Chloride: 97 mEq/L — ABNORMAL LOW (ref 100–111)
Creatinine: 0.7 mg/dL (ref 0.6–1.0)
Glucose: 81 mg/dL (ref 70–100)
Potassium: 3.8 mEq/L (ref 3.5–5.1)
Sodium: 135 mEq/L — ABNORMAL LOW (ref 136–145)

## 2020-08-05 LAB — CBC
Absolute NRBC: 0 10*3/uL (ref 0.00–0.00)
Hematocrit: 42.8 % (ref 34.7–43.7)
Hgb: 14.7 g/dL (ref 11.4–14.8)
MCH: 30.2 pg (ref 25.1–33.5)
MCHC: 34.3 g/dL (ref 31.5–35.8)
MCV: 87.9 fL (ref 78.0–96.0)
MPV: 9.6 fL (ref 8.9–12.5)
Nucleated RBC: 0 /100 WBC (ref 0.0–0.0)
Platelets: 296 10*3/uL (ref 142–346)
RBC: 4.87 10*6/uL (ref 3.90–5.10)
RDW: 13 % (ref 11–15)
WBC: 5.96 10*3/uL (ref 3.10–9.50)

## 2020-08-05 LAB — GFR: EGFR: >60

## 2020-08-05 MED ORDER — SUCRALFATE 1 GM/10ML PO SUSP
1.0000 g | Freq: Four times a day (QID) | ORAL | Status: DC | PRN
Start: 2020-08-05 — End: 2020-08-06

## 2020-08-05 MED ORDER — PANTOPRAZOLE SODIUM 40 MG IV SOLR
40.0000 mg | Freq: Two times a day (BID) | INTRAVENOUS | Status: DC
Start: 2020-08-05 — End: 2020-08-06
  Administered 2020-08-05 – 2020-08-06 (×2): 40 mg via INTRAVENOUS
  Filled 2020-08-05 (×2): qty 40

## 2020-08-05 MED ORDER — ONDANSETRON 4 MG PO TBDP
4.0000 mg | ORAL_TABLET | Freq: Four times a day (QID) | ORAL | Status: DC | PRN
Start: 2020-08-05 — End: 2020-08-06
  Administered 2020-08-06: 10:00:00 4 mg via ORAL
  Filled 2020-08-05: qty 1

## 2020-08-05 MED ORDER — ONDANSETRON HCL 4 MG/2ML IJ SOLN
4.0000 mg | Freq: Four times a day (QID) | INTRAMUSCULAR | Status: DC | PRN
Start: 2020-08-05 — End: 2020-08-06
  Administered 2020-08-05 (×2): 4 mg via INTRAVENOUS
  Filled 2020-08-05 (×2): qty 2

## 2020-08-05 MED ORDER — PROCHLORPERAZINE MALEATE 5 MG PO TABS
5.0000 mg | ORAL_TABLET | ORAL | Status: DC | PRN
Start: 2020-08-05 — End: 2020-08-06
  Filled 2020-08-05: qty 1

## 2020-08-05 MED ORDER — PROCHLORPERAZINE EDISYLATE 10 MG/2ML IJ SOLN
5.0000 mg | INTRAMUSCULAR | Status: DC | PRN
Start: 2020-08-05 — End: 2020-08-06
  Filled 2020-08-05 (×2): qty 2

## 2020-08-05 NOTE — Plan of Care (Signed)
Problem: Safety  Goal: Patient will be free from infection during hospitalization  Outcome: Progressing  Flowsheets (Taken 08/05/2020 0300)  Free from Infection during hospitalization:   Assess and monitor for signs and symptoms of infection   Monitor lab/diagnostic results   Monitor all insertion sites (i.e. indwelling lines, tubes, urinary catheters, and drains)   Encourage patient and family to use good hand hygiene technique     Problem: Pain  Goal: Pain at adequate level as identified by patient  Outcome: Progressing  Flowsheets (Taken 08/05/2020 0300)  Pain at adequate level as identified by patient:   Identify patient comfort function goal   Assess for risk of opioid induced respiratory depression, including snoring/sleep apnea. Alert healthcare team of risk factors identified.   Assess pain on admission, during daily assessment and/or before any "as needed" intervention(s)   Reassess pain within 30-60 minutes of any procedure/intervention, per Pain Assessment, Intervention, Reassessment (AIR) Cycle   Evaluate if patient comfort function goal is met   Evaluate patient's satisfaction with pain management progress   Offer non-pharmacological pain management interventions   Include patient/patient care companion in decisions related to pain management as needed
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
## 2020-08-05 NOTE — Malnutrition Assessment (Signed)
Bridget [name] is a 24 y.o. female patient.   [id number]      Malnutrition Documentation    Severe Protein Energy Malnutrition related to frequent nausea/vomiting, loss of appetite as evidenced by PO intake <50% of estimated needs over past week, 4.7% wt loss in <1 week      Harl Favor, RD  Clinical Dietitian  Ext. [phone]      If physician disagrees with this assessment see addendum.

## 2020-08-05 NOTE — Progress Notes (Signed)
[facility] Progress Note  Physician Available 24 hours a day:  Please call number in sticky note to reach hospital team.  Office#: [phone]     Date Time: 08/05/20 5:01 PM  Patient Name: Bridget [name]  Attending Physician: Barnie Alderman, MD  Primary Care Physician: Reynold Bowen, MD    CC: Cyclic vomiting syndrome    Assessment:   Principal Problem:    Cyclic vomiting syndrome  Active Problems:    Dehydration  Resolved Problems:    * No resolved hospital problems. *      24 y.o. female with known history of asthma, Ehlers-Danlos, POTS, cyclic vomiting syndrome and possible gastroparesis, admitted with dehydration 2/2 intractable vomiting, having been seen in ED multiple times this past week for the same.     #Intractable nausea and vomiting  Has been seen in the ED for this problem 4 times in the past week. Possibly 2/2 marijuana overuse, gastroparesis may be contributing  - GI consulted appreciate recs   - Continue amitriptyline 10mg  QHS per GI recs  - Continue dronabinol 2.5mg  BID as prescribed on discharge two days ago, pt has not yet trialed at home   - Continue erythromycin 250mg  BID. Pt prescribed 4 times daily by GI in the past but has been unable to tolerate this frequency, has been taking BID and was told by GI this is okay. Will consider increasing frequency during admission as tolerated  - Protonix substituted for home prilosec, increased frequency to protonix 40mg  BID per GI recs. Switched from PO to IV protonix today due to pt's inability to tolerate PO, and very concerned about worsening reflux  - Tums 500mg  daily PRN  - Carafate 4 times daily PRN with meals   - Compazine 5mg  IV or PO Q6h PRN  - Phenergan 25mg  rectally Q6h PRN  - Zofran 4mg  IV or PO Q6h PRN  - s/p NS IVF in ED, continue mIVF with LR @75cc /hr  - Diet advanced to regular today, pt will self-regulate   - Will avoid Reglan and Haldol as pt reports adverse reactions to these in the past    #POTS  - Stable,  asymptomatic  - Pt followed by cardiology Dr. Cleotis Lema  - Previously taking clonidine, pt reports has not been taking this recently   - Continue to monitor BP and HR    #FEN/GI   - mIVF: LR @75cc /hr  - Diet: regular    #PPX  - DVT: SCDs, SC lovenox  - GI: PPI    CODE STATUS: Full Code     DISPO:  Today's date: 08/05/2020  Admit Date: 08/04/2020 12:06 PM  Anticipated medical stability for discharge:Yellow - maybe tomorrow  Service status: Observation:patient is stable and improving.  Anticipated discharge needs: TBD    Subjective:     Interval History/HPI:     Pt not feeling well this AM, vomited x3 and has been having trouble tolerating PO meds. IV PRN nausea meds ordered, pt and mother concerned about what alternatives will be available to her given she is trying to give up marijuana, which she has previously been using to help with nausea and appetite stimulation. Pt continues to struggle with "missing the window" of time when she would be able to eat something before she feels too nauseous, states if she waits too long to eat the nausea gets worse, and at that point she will end up vomiting whether she eats or not. Pt became tearful discussing ongoing issues  with cyclic nausea and vomiting, agrees that she has a lot of life stressors contributing to her symptoms and would consider trial of SSRI to help with both anxiety/depression as well as GI issues.     Review of Systems:     Denies fever, sweats, chills, chest pain, dyspnea     Physical Exam:     VITAL SIGNS: Physical Exam:   Temp:  [97.3 F (36.3 C)-98.4 F (36.9 C)] 98.4 F (36.9 C)  Heart Rate:  [85-97] 97  Resp Rate:  [12-17] 16  BP: (110-144)/(63-98) 110/71    No intake or output data in the 24 hours ending 08/05/20 1701 GEN: awake, alert and oriented x 3  HEENT: Dry mucous membranes  CARDS: regular rate and rhythm, no m/r/g  LUNGS: clear to auscultation bilaterally, without wheezing, rhonchi, or rales  ABD: soft, non-tender, non-distended; no  palpable masses,  normoactive BS  EXT: no edema         Current Meds:     Current Facility-Administered Medications   Medication Dose Route Frequency   . amitriptyline  10 mg Oral QHS   . dronabinol  2.5 mg Oral BID   . erythromycin base  250 mg Oral BID   . heparin (porcine)  5,000 Units Subcutaneous Q12H [facility]   . norgestrel-ethinyl estradiol  1 tablet Oral Daily   . pantoprazole  40 mg Oral BID     Current Facility-Administered Medications   Medication Dose Route Frequency Last Rate   . lactated ringers   Intravenous Continuous 75 mL/hr at 08/05/20 5409     Current Facility-Administered Medications   Medication Dose Route   . calcium carbonate  1 tablet Oral   . dextrose  15 g of glucose Oral    And   . dextrose  12.5 g Intravenous    And   . glucagon (rDNA)  1 mg Intramuscular   . dicyclomine  20 mg Oral   . melatonin  3 mg Oral   . naloxone  0.2 mg Intravenous   . ondansetron  4 mg Intravenous    Or   . ondansetron  4 mg Oral   . prochlorperazine  5 mg Intravenous    Or   . prochlorperazine  5 mg Oral   . promethazine  25 mg Rectal         Labs:     Labs (last 24 hours):  Results     Procedure Component Value Units Date/Time    Basic Metabolic Panel [[id number]]  (Abnormal) Collected: 08/05/20 0933    Specimen: Blood Updated: 08/05/20 1037     Glucose 81 mg/dL      BUN 6 mg/dL      Creatinine 0.7 mg/dL      Calcium 9.5 mg/dL      Sodium 956 mEq/L      Potassium 3.8 mEq/L      Chloride 97 mEq/L      CO2 24 mEq/L      Anion Gap 14.0    GFR [[id number]] Collected: 08/05/20 0933     Updated: 08/05/20 1037     EGFR >60.0    CBC without differential [[id number]] Collected: 08/05/20 0933    Specimen: Blood Updated: 08/05/20 1013     WBC 5.96 x10 3/uL      Hgb 14.7 g/dL      Hematocrit 41.3 %      Platelets 296 x10 3/uL      RBC 4.87 x10 6/uL  MCV 87.9 fL      MCH 30.2 pg      MCHC 34.3 g/dL      RDW 13 %      MPV 9.6 fL      Nucleated RBC 0.0 /100 WBC      Absolute NRBC 0.00 x10 3/uL           Imaging (last 24 hours),  reviewed and are significant for:  No results found.    Signed by: Irving Shows, MD   Office#: [phone]     UJ:[id number], Orma Render, MD

## 2020-08-05 NOTE — Consults (Signed)
Nutrition Assessment    Bridget [name] 25 y.o. female   MRN: [id number]      Reason for Assessment: MST-2 (wt loss, decreased appetite); Low BMI = 16.80 kg/m2    Nutrition Recommendation:  Goal: Prevent further wt loss.   Plan:   1) Recommend regular diet. Encourage small, frequent intake throughout the day.    2) Ensure Enlive BID.    3) Agree with trial of low dose Marinol.    4) MD to adjust reflux medications, including adding Prilosec to regimen. Agree with this intervention given many of pt's s/s seem to be reflux-related.     Harl Favor, RD  Clinical Dietitian  Ext. [phone]  ______________________________________________________________________      Assessment Data:  Summary:   24 y.o. female p/w dehydration 2/2 intractable N/V. PMH significant for asthma, Ehlers-Danlos syndrome, POTS, cyclic vomiting syndrome, frequent marijuana use (reports using for appetite stimulation; last smoked on 07/27/20), gastroparesis. Multiple recent ED visits and admit <1 wk ago for similar symptoms. Started on trial of low-dose amitriptyline, PPI dosage increased.     Clinical Nutrition very familiar with pt from recent admit. Pt seen at bedside this AM, mother present. Pt and mother very frustrated by repeat hospitalization, frequently stating that they do not feel their concerns are being heard by MDs. Specifically, they are both upset that pt was put on PO meds vs IV and that they still lack a viable solution moving forward for preventing further hospitalizations. Pt's N/V recurred soon after recent d/c, was not able to take Marinol at home 2/2 Pharmacy had run out. When initially admitted, pt began feeling better with IV fluids and was motivated to eat last night. This AM, while feeling hungry, pt reports being unable to obtain food 2/2 clear liquid diet order (only consumed water and popsicles) and subsequently had recurrence of N/V. Has vomited 3 times thus far today (witnessed by RN Raman). Abdomen soft, NTTP, +BS.  Denies constipation/diarrhea. +Significant wt loss over past week (see below). Thin, but no actual muscle or fat wasting appreciated. Spent extensive time discussing possible solutions with pt and mother, including taking Zofran with meals (vs once already on verge of vomiting), adjusting reflux meds, resumption of low-dose marinol (which pt acknowledges did help with appetite), and advancing to regular diet + keeping snacks at her bedside so she does not go prolonged periods of time without food. Pt and mother both in agreement with plan and requested RD presence during [facility] rounds this AM.     Discussed situation with [facility] Attending and residents prior to rounding. MD addressed pt's concerns directly and spent extensive time brainstorming solutions- ultimately in agreement with the interventions outlined above in addition to trial of Carafate and possibly SSIs. Pt and family (father also present during this discussion) satisfied with plan. Of note, Attending MD also recommended "avoiding inflammatory foods like junk food" in order to better control gastroparesis symptoms- this is not an evidenced-based recommendation; afterwards clarified with pt that this RD would not recommend avoiding "junk food" or others that have not caused her symptoms at this time. Pt seems more optimistic about ability to manage symptoms after d/c. Denies nutrition-related questions/concerns at this time.     Weight Monitoring Weight Weight Method   02/05/2020 45.1 kg Standing Scale   04/01/2020 46.1 kg Standing Scale   07/30/2020 46 kg Standing Scale   07/31/2020 46.6 kg Standing Scale   08/01/2020 45.4 kg Standing Scale   08/04/2020 44.4 kg Standing Scale  4.7% wt loss in <1 week = significant.     Adm dx:  Dehydration  Patient Active Problem List   Diagnosis   . Postcoital UTI   . Asthma   . Delayed gastric emptying   . Ehlers-Danlos syndrome   . Endometriosis   . Postural orthostatic tachycardia syndrome   . ASCUS with positive high risk  HPV cervical   . Cyclic vomiting syndrome   . Cyclical vomiting   . Dehydration       PMH:  has a past medical history of Abdominal pain, Asthma, Complication of anesthesia, Cyclical vomiting, Ehlers-Danlos disease, Gastroparesis, Post-operative nausea and vomiting, POTS (postural orthostatic tachycardia syndrome), Sinus trouble, UTI (urinary tract infection), and Yeast infection involving the vagina and surrounding area (05/09/2015).    Recent Labs   Lab 08/05/20  0933 08/04/20  1254 08/01/20  1536 07/31/20  1431 07/30/20  1220   Sodium 135* 136 137 139 138   Potassium 3.8 3.5 3.6 3.6 3.8   Chloride 97* 95* 101 103 104   CO2 24 22 21* 23 21*   BUN 6* 13 11 12 12    Creatinine 0.7 0.9 0.7 0.8 0.8   Glucose 81 89 73 93 110*   Calcium 9.5 10.5 9.8 10.3 10.4   EGFR >60.0 >60.0 >60.0 >60.0 >60.0   WBC 5.96 10.77* 9.40 11.55* 13.99*   Hematocrit 42.8 50.3* 41.9 44.9* 44.4*   Hgb 14.7 17.0* 14.1 15.0* 14.8   Lipase  --  44 20 25 34   Bilirubin, Total  --  1.1 0.9 0.9 0.6   Bilirubin Direct  --  0.3  --   --   --    Bilirubin Indirect  --  0.8  --   --   --    AST (SGOT)  --  18 18 18 17    ALT  --  18 17 18 15    Alkaline Phosphatase  --  67 53 59 57             Current Facility-Administered Medications   Medication Dose Route Frequency   . amitriptyline  10 mg Oral QHS   . dronabinol  2.5 mg Oral BID   . erythromycin base  250 mg Oral BID   . heparin (porcine)  5,000 Units Subcutaneous Q12H [facility]   . norgestrel-ethinyl estradiol  1 tablet Oral Daily   . pantoprazole  40 mg Oral BID     . lactated ringers 75 mL/hr at 08/05/20 0829     PRN meds given in the past 48 hours: Zofran, Compazine, Phenergan    Social History: Supportive mother and father. Also has a sister.          Intake History: Issues with PO intake since ~June 2021, but more acutely over past week or so. NKFA.                                                                             Orders Placed This Encounter      Diet regular    Orders Placed This Encounter    Procedures   . Diet regular       Food intake: small amounts of clear liquids thus  far     Allergies   Allergen Reactions   . Haloperidol      Interacts with reglan and also pt had involuntary eye movements.    . Latex Itching     Itching irritation swelling condoms  Itching irritation swelling condoms  Itching irritation swelling condoms   . Reglan [Metoclopramide]    . Morphine Rash       Nutrition Focused Physical Exam:  Head - WNL  General - thin but no actual muscle/fat wasting   Edema - none  Skin - pale, warm, dry   GI function - see above     Learning Needs: no formal education needs at this time     Anthropometrics  Weight: (!) 44.4 kg (97 lb 14.2 oz)  Weight Change: 0                       Total Daily Energy Needs: 1110 to 1332 kcal  Method for Calculating Energy Needs: 25 kcal - 30 kcal per kg  at 44.4 kg (Actual body weight)       Total Daily Protein Needs: 35.52 to 44.4 g  Method for Calculating Protein Needs: 0.8 g - 1 g per kg at 44.4 kg (Actual body weight)       Total Daily Fluid Needs: 1332 to 1554 ml  Method for Calculating Fluid Needs: 30 ml - 35 ml  per kg at 44.4 kg (Actual body weight)       Nutrition Diagnosis:   Severe Protein Energy Malnutrition related to frequent nausea/vomiting, loss of appetite as evidenced by PO intake <50% of estimated needs over past week, 4.7% wt loss in <1 week    Intervention:  Goal: Prevent further wt loss.   Plan:   1) Recommend regular diet. Encourage small, frequent intake throughout the day.    2) Ensure Enlive BID.    3) Agree with trial of low dose Marinol.    4) MD to adjust reflux medications, including adding Prilosec to regimen. Agree with this intervention given many of pt's s/s seem to be reflux-related.     Monitoring/Evaluation:   1. PO intake  2. Weights  3. GI symptoms      Harl Favor, RD

## 2020-08-05 NOTE — Discharge Summary (Signed)
[facility] Discharge Summary  Physician Available 24 hours a day:  Office#: [phone]       Date Time: 08/06/20 4:46 PM  Patient Name: Bridget [name]  Attending Physician: Barnie Alderman, Bridget [name]  Primary Care Physician: Reynold Bowen, Bridget [name]    Date of Admission: 08/04/2020  Date of Discharge: 08/06/20    Hospitalization Diagnoses:     Active Problems:    Dehydration  Resolved Problems:    * No resolved hospital problems. *      Disposition:     home with family    Pending Results, Recommendations & Instructions after discharge:     1. Micro / Labs / Path pending: none   2. Date of completion for antibiotics or other medications: NA, continue all discharge medications until further follow up    HPI:   Per Dr. Edison Simon admission H&P:     "Bridget [name] is a 24 y.o. female with known history of asthma, Ehlers-Danlos, POTS, cyclic vomiting syndrome and possible gastroparesis, admitted with dehydration 2/2 intractable vomiting. This is patient's fourth ED visit this week for these same symptoms, after third presentation, she was admitted for IV hydration and observation overnight, with improvement after IVF and antiemetics. Patient has struggled with nausea and cyclic vomiting for several years, and has been dealing with chronically poor appetite and weight loss.     Pt has been self-medicating with marijuana, stating that smoking marijuana helps both with appetite stimulation and to curb her nausea. She has been instructed several times in the past by PCP and GI that marijuana may be contributing to her cyclic vomiting, and she understands this well. She has tried multiple times in the past to stop smoking, but when she stops, the nausea/vomiting worsens and her appetite is very poor.     Pt reports that when she doesn't eat she then becomes nauseous, and the worse her nausea is, the more likely she will vomit whether or not she eats. She takes zofran at home for mild to moderate nausea as  needed, and additionally takes phenergan PRN for nausea. She was previously taking Reglan, but this caused tardive dyskinesia symptoms so she stopped taking it. She was also prescribed erythromycin 4x daily to help with nausea and gastric emptying, but only takes this twice daily and was advised by GI to continue BID dosing.     During last admission, pt was prescribed dronabinol 2.5mg  BID for appetite stimulation in hopes of replacing marijuana, but pt has not yet taken this medication since discharge. Her last marijuana use was one week ago. Pt has been having issues staying ahead of her nausea at work, as she is unable to smoke at or before work, which means her nausea more frequently gets out of control.     Pt also notes her nausea and vomiting are worse around the time of her period. Pt currently on OCPs, continuously takes hormone pills and skips placebo for upwards of 6 months to avoid periods. She had an IUD in the past but reacted badly to it, and had to have it taken out. She does note that she has always had worsened nausea/vomiting spells at the time of her menstrual cycle.     Patient is followed by Dr. Hayden Rasmussen [facility] and Dr. Trula Slade with [facility]. [facility] would like to do a gastric emptying study with her and potentially trial domperidone if she is shown to have gastroparesis. [facility] recommending trial of TCA, possible BID  dosing of PPI, and additionally recommends possible domperidone trial."    Consultations:   Treatment Team:   Attending Provider: Barnie Alderman, Bridget [name]  Consulting Physician: Kenney Houseman, Bridget [name]    Hospital Course:     Patient admitted to the hospital due to intractable nausea and vomiting. During her stay, she was treated with both PO and IV antiemetics including zofran, phenergan, and compazine. She was switched from her home PPI omeprazole to pantoprazole 40mg  BID, and per GI recs she was initiated on amitriptyline 10mg  QHS, which was increased to 25mg  QHS  on discharge. She was initiated on dronabinol 2.5mg  BID upon discharge from the hospital last week, pt had not yet started this medication as an outpt but was started on it again on this admission. Home erythromycin 250mg  BID was ordered on admission, but pt declining to take this medication, would like to follow up with GI to discuss further before restarting. Pt was started on clear liquid diet and advanced to regular diet on day two of hospital stay, she tolerated three full meals well without recurrence of nausea/vomiting. She received 1L fluid bolus in the ED and continued to receive gentle mIVF during admission. Discussed at length with pt that she would need to stop marijuana use completely in order to break her vomiting cycles. Additionally advised that pt should get regular exercise and follow a healthy diet free of processed foods and other foods that will cause additional inflammation.     Pt to follow up with GI Dr. Hayden Rasmussen ([facility]) and GI Dr. Trula Slade [facility]), additionally needs to follow up with Dr. Cleotis Lema (Cardio) for POTS. She was discharged on her previous home regimen of PRN antiemetics and pain medications, PPI was switched to pantoprazole 40mg  BID, and pt was initiated on amitriptyline 25mg  QHS on discharge. She was additionally provided with Carafate to use up to 4 times daily PRN for nausea and reflux. Pt instructed to follow up with PCP within 3-5 days to evaluate symptoms and review medications. She was in stable condition at time of discharge.     Discharge Day Exam:     VITAL SIGNS: PHYSICAL:   Temp:  [97.3 F (36.3 C)-98.4 F (36.9 C)] 97.3 F (36.3 C)  Heart Rate:  [85-115] 87  Resp Rate:  [12-18] 12  BP: (111-142)/(63-98) 142/98  Body mass index is 16.8 kg/m.   General: awake, alert; NAD  Cards: regular rate and rhythm, no murmurs, rubs or gallops  Lungs: clear to auscultation bilaterally, without wheezing, rhonchi, or rales  Abdomen: soft, non-tender, non-distended;    Extremities: no clubbing, cyanosis, or edema  Neuro: no focal deficits  Skin: no rashes or lesions noted         Recent Labs:       Recent Labs   Lab 08/04/20  1254 08/01/20  1536   WBC 10.77* 9.40   Hgb 17.0* 14.1   Hematocrit 50.3* 41.9   Platelets 415* 340           Recent Labs   Lab 08/04/20  1254 08/01/20  1536   Alkaline Phosphatase 67 53   Bilirubin, Total 1.1 0.9   Bilirubin Direct 0.3  --    Protein, Total 8.5* 7.4   Albumin 5.2* 4.6   ALT 18 17   AST (SGOT) 18 18      Recent Labs   Lab 08/04/20  1254 08/01/20  1536   Sodium 136 137   Potassium 3.5 3.6  Chloride 95* 101   CO2 22 21*   BUN 13 11   Glucose 89 73   Calcium 10.5 9.8                 Invalid input(s): FREET4         Procedures/Radiology performed:     No results found.    Echo Results     None          Discharge Instructions & Follow Up Plan for Patient:     Diet: Regular    Activity/Weight Bearing: No restrictions    Code Status: Full Code    Instructed to follow up with:   - PCP Dr. Danella Penton   - GI Dr. Hayden Rasmussen and Dr. Trula Slade  - Cardiology Dr. Cleotis Lema    Discharge Medications:        Medication List      ASK your doctor about these medications    calcium carbonate 500 MG chewable tablet  Commonly known as: TUMS     cloNIDine 0.1 MG tablet  Commonly known as: CATAPRES     dicyclomine 10 MG capsule  Commonly known as: BENTYL  Take 2 capsules (20 mg total) by mouth 4 (four) times daily as needed (abdominal pain/cramps)     dronabinol 2.5 MG capsule  Commonly known as: MARINOL  Take 1 capsule (2.5 mg total) by mouth 2 (two) times daily     Erythromycin 250 MG Tbec     norethindrone-ethinyl estradiol 1-35 MG-MCG per tablet  Commonly known as: ORTHO-NOVUM 1/35  Take 1 tablet by mouth daily     omeprazole 40 MG capsule  Commonly known as: PriLOSEC     ondansetron 4 MG disintegrating tablet  Commonly known as: ZOFRAN-ODT  Take 1 tablet (4 mg total) by mouth every 6 (six) hours as needed for Nausea     prochlorperazine 5 MG tablet  Commonly known as:  COMPAZINE  Take 1 tablet (5 mg total) by mouth every 6 (six) hours as needed for Nausea     promethazine 25 MG suppository  Commonly known as: PHENERGAN  Place 1 suppository (25 mg total) rectally every 6 (six) hours as needed for Nausea  Ask about: Which instructions should I use?     pyridostigmine 60 MG tablet  Commonly known as: MESTINON            Signed by: Bridget Shows, Bridget [name]  [facility]    Our team has spent more than 30 minutes today involving counseling or coordination of care related to the patient's discharge.    CC: Reynold Bowen, Bridget [name]

## 2020-08-05 NOTE — Plan of Care (Signed)
Problem: Safety  Goal: Patient will be free from injury during hospitalization  Outcome: Progressing  Flowsheets (Taken 08/05/2020 1507)  Patient will be free from injury during hospitalization:   Assess patient's risk for falls and implement fall prevention plan of care per policy   Use appropriate transfer methods   Ensure appropriate safety devices are available at the bedside   Provide and maintain safe environment   [place] rounding   Include patient/ family/ care giver in decisions related to safety     Problem: Altered GI Function  Goal: Nutritional intake is adequate  Outcome: Progressing  Flowsheets (Taken 08/05/2020 1508)  Nutritional intake is adequate:   Assist patient with meals/food selection   Allow adequate time for meals   Encourage/perform oral hygiene as appropriate   Consult/collaborate with Clinical Nutritionist   Assess anorexia, appetite, and amount of meal/food tolerated    Patient alert and oriented x4. C/o nausea, vomited x3 per pt, PRN Compazine, Zofran given. On regular diet now. IVF continued as per order. Still with poor appetite d/t nausea. Independent with her care. Will continue with POC.

## 2020-08-05 NOTE — UM Notes (Addendum)
PRESENTED TO ED: 08/04/2020 12:06 PM      ADMITTED TO CLASS:  Observation   UNIT:  MEDICAL      Reason For Hospitalization:  24 y.o. female with known history of asthma, Ehlers-Danlos, POTS, cyclic vomiting syndrome and possible gastroparesis, admitted with dehydration 2/2 intractable vomiting, having been seen in ED multiple times this past week for the same.   Labs show leukocytosis, hemoconcentration/dehydration, anion gap acidosis, possible UTI.        PMH:  has a past medical history of Abdominal pain, Asthma, Complication of anesthesia, Cyclical vomiting, Ehlers-Danlos disease, Gastroparesis, Post-operative nausea and vomiting, POTS (postural orthostatic tachycardia syndrome), Sinus trouble, UTI (urinary tract infection), and Yeast infection involving the vagina and surrounding area (05/09/2015).      ED Triage Vitals   Enc Vitals Group      BP (!) 125/96      Heart Rate (!) 115      Resp Rate 18      Temp 98.4 F (36.9 C)      Temp Source Oral      SpO2 98 %      Weight (!) 44.4 kg (97 lb 14.2 oz)      Height        LABS:    Results     Procedure Component Value Units Date/Time    Lipase [[id number]] Collected: 08/04/20 1254     Updated: 08/04/20 1349     Lipase 44 U/L     Hepatic function panel (LFT) [[id number]]  (Abnormal) Collected: 08/04/20 1254     Updated: 08/04/20 1349     Bilirubin, Total 1.1 mg/dL      Bilirubin Direct 0.3 mg/dL      Bilirubin Indirect 0.8 mg/dL      AST (SGOT) 18 U/L      ALT 18 U/L      Alkaline Phosphatase 67 U/L      Protein, Total 8.5 g/dL      Albumin 5.2 g/dL      Globulin 3.3 g/dL      Albumin/Globulin Ratio 1.6    Basic Metabolic Panel [[id number]]  (Abnormal) Collected: 08/04/20 1254    Specimen: Blood Updated: 08/04/20 1330     Glucose 89 mg/dL      BUN 13 mg/dL      Creatinine 0.9 mg/dL      Calcium 08.6 mg/dL      Sodium 578 mEq/L      Potassium 3.5 mEq/L      Chloride 95 mEq/L      CO2 22 mEq/L      Anion Gap 19.0    GFR [[id number]] Collected: 08/04/20 1254     Updated:  08/04/20 1330     EGFR >60.0    UA Reflex to Micro - Reflex to Culture [[id number]]  (Abnormal) Collected: 08/04/20 1254     Updated: 08/04/20 1324     Urine Type Urine, Clean Ca     Color, UA Yellow     Clarity, UA Clear     Specific Gravity UA 1.027     Urine pH 6.0     Leukocyte Esterase, UA Trace     Nitrite, UA Negative     Protein, UR 100     Glucose, UA Negative     Ketones UA 80     Urobilinogen, UA 4.0 mg/dL      Bilirubin, UA Negative     Blood, UA Negative     RBC, UA 3 -  5 /hpf      WBC, UA 0 - 5 /hpf      Squamous Epithelial Cells, Urine 0 - 5 /hpf      Urine Mucus Present    Beta HCG, Qual, Serum [[id number]] Collected: 08/04/20 1254    Specimen: Blood Updated: 08/04/20 1321     Hcg Qualitative Negative    CBC and differential [[id number]]  (Abnormal) Collected: 08/04/20 1254    Specimen: Blood Updated: 08/04/20 1303     WBC 10.77 x10 3/uL      Hgb 17.0 g/dL      Hematocrit 78.2 %      Platelets 415 x10 3/uL      RBC 5.65 x10 6/uL      MCV 89.0 fL      MCH 30.1 pg      MCHC 33.8 g/dL      RDW 13 %      MPV 9.5 fL      Neutrophils 69.6 %      Lymphocytes Automated 22.4 %      Monocytes 6.7 %      Eosinophils Automated 0.3 %      Basophils Automated 0.6 %      Immature Granulocytes 0.4 %      Nucleated RBC 0.0 /100 WBC      Neutrophils Absolute 7.51 x10 3/uL      Lymphocytes Absolute Automated 2.41 x10 3/uL      Monocytes Absolute Automated 0.72 x10 3/uL      Eosinophils Absolute Automated 0.03 x10 3/uL      Basophils Absolute Automated 0.06 x10 3/uL      Immature Granulocytes Absolute 0.04 x10 3/uL      Absolute NRBC 0.00 x10 3/uL                 PLAN:  #Intractable nausea and vomiting  Has been seen in the ED for this problem 4 times in the past week. Possibly 2/2 marijuana overuse, gastroparesis may be contributing  - Will trial amitriptyline 10mg  QHS per GI recs  - Will continue dronabinol 2.5mg  BID as prescribed on discharge two days ago, pt has not yet trialed at home   - Continue erythromycin 250mg   BID   - Protonix substituted for home prilosec, increased frequency to protonix 40mg  BID per GI recs  - Tums 500mg  daily PRN  - Compazine 5mg  Q6h PRN  - Phenergan 25mg  rectally Q6h PRN  - Zofran 4mg  Q6h PRN  - s/p NS IVF in ED, continue mIVF with LR @75cc /hr  - Clear liquid diet, advance as tolerated   - GI consulted appreciate recs   - Will avoid Reglan and Haldol as pt reports adverse reactions to these in the past     - mIVF: LR @75cc /hr  - Diet: clear liquid, advance as tolerated      Current Facility-Administered Medications   Medication Dose Route Frequency   . amitriptyline  10 mg Oral QHS   . dronabinol  2.5 mg Oral BID   . erythromycin base  250 mg Oral BID   . heparin (porcine)  5,000 Units Subcutaneous Q12H [facility]   . norgestrel-ethinyl estradiol  1 tablet Oral Daily   . pantoprazole  40 mg Oral BID     . lactated ringers 75 mL/hr at 08/04/20 1943         PER GI:  Assessment:  1. Nausea and vomiting: History of cyclic vomiting syndrome and suspected underlying gastroparesis, currently unable  to tolerate a diet since her discharge on Saturday.   2. Dehydration  Plan:  1. Antiemetics, and IV fluids per primary team  2. Recommend discontinuation of recreational marijuana use  3. Could consider Amitriptyline 10mg  PO qHS for the cyclic vomiting syndrome  4. Consider increasing PPI to 40 mg PO/IV bid          ==========Day 2:  August 05, 2020==========      Vitals:    08/04/20 1925 08/04/20 2345 08/05/20 0423 08/05/20 0437   BP: 111/70 111/63 (!) 133/91 (!) 142/98   Pulse: 85 89 90 87   Resp: 16 12 17 12    Temp: 98.2 F (36.8 C) 98.2 F (36.8 C) 97.5 F (36.4 C) 97.3 F (36.3 C)   TempSrc: Oral Oral Oral Oral   SpO2: 95% 95% 99% 100%   Weight:            Having persistent HTN urgency today.       Current Facility-Administered Medications   Medication Dose Route Frequency   . amitriptyline  10 mg Oral QHS   . dronabinol  2.5 mg Oral BID   . erythromycin base  250 mg Oral BID   . heparin (porcine)   5,000 Units Subcutaneous Q12H [facility]   . norgestrel-ethinyl estradiol  1 tablet Oral Daily   . pantoprazole  40 mg Oral BID     . lactated ringers 75 mL/hr at 08/04/20 1943       PRNs given 12/21 as of  1pm:  zofran IV x 1, compazine po x 1, phenergan supp x 1                                                                                                                                                                                       ** This clinical review is compiled from documentation provided by the treatment team in the medical record. **  Nam Vossler "Lucilla Edin, RN, BSN, ACM-RN  Utilization Review Case Manager II  [facility]  [place], [place]  Phone:  [phone]  Fax:  [phone]  Case Management Department Main Phone:  [phone]     Tax ID:  [id number]  NPI:  [id number]     Please use fax number [phone] to provide authorization for hospital services or to request additional information.

## 2020-08-05 NOTE — Plan of Care (Signed)
Patient is alert and oriented X 4. 24 y.o. female with known history of asthma,  POTS, cyclic vomiting syndrome and possible gastroparesis, admitted with dehydration. Patient refused Erythromycin tablet medicine, stated that the medication makes her nauseated. Bed is kept in the lower position. Call light within reach.Will continue to monitor.      Problem: Safety  Goal: Patient will be free from injury during hospitalization  Flowsheets (Taken 08/05/2020 2123)  Patient will be free from injury during hospitalization:  . Assess patient's risk for falls and implement fall prevention plan of care per policy  . Provide and maintain safe environment  . Ensure appropriate safety devices are available at the bedside     Problem: Altered GI Function  Goal: Fluid and electrolyte balance are achieved/maintained  Flowsheets (Taken 08/05/2020 2127)  Fluid and electrolyte balance are achieved/maintained:  . Provide adequate hydration  . Assess for confusion/personality changes  . Assess and reassess fluid and electrolyte status  . Monitor intake and output every shift

## 2020-08-06 DIAGNOSIS — E860 Dehydration: Secondary | ICD-10-CM

## 2020-08-06 DIAGNOSIS — R1115 Cyclical vomiting syndrome unrelated to migraine: Secondary | ICD-10-CM

## 2020-08-06 MED ORDER — SUCRALFATE 1 GM/10ML PO SUSP
1.0000 g | Freq: Four times a day (QID) | ORAL | 0 refills | Status: DC | PRN
Start: 2020-08-06 — End: 2021-03-13

## 2020-08-06 MED ORDER — ERYTHROMYCIN 250 MG PO TBEC
250.0000 mg | DELAYED_RELEASE_TABLET | Freq: Two times a day (BID) | ORAL | 0 refills | Status: AC
Start: 2020-08-06 — End: 2020-08-20

## 2020-08-06 MED ORDER — AMITRIPTYLINE HCL 25 MG PO TABS
25.0000 mg | ORAL_TABLET | Freq: Every evening | ORAL | 0 refills | Status: DC
Start: 2020-08-06 — End: 2021-03-13

## 2020-08-06 MED ORDER — PANTOPRAZOLE SODIUM 40 MG PO TBEC
40.0000 mg | DELAYED_RELEASE_TABLET | Freq: Two times a day (BID) | ORAL | 0 refills | Status: AC
Start: 2020-08-06 — End: ?

## 2020-08-06 NOTE — Plan of Care (Signed)
Pt is independent with ADLs. Pts desire to go home. MD agrees if pt is tolerating food. Pt states tolerating food. Went over discharge paperwork with pt. IV removed.   Problem: Safety  Goal: Patient will be free from injury during hospitalization  Outcome: Progressing  Flowsheets (Taken 08/06/2020 1656)  Patient will be free from injury during hospitalization:   Assess patient's risk for falls and implement fall prevention plan of care per policy   Provide and maintain safe environment   Use appropriate transfer methods   Ensure appropriate safety devices are available at the bedside   Include patient/ family/ care giver in decisions related to safety   Hourly rounding  Goal: Patient will be free from infection during hospitalization  Outcome: Progressing  Flowsheets (Taken 08/06/2020 1656)  Free from Infection during hospitalization:   Assess and monitor for signs and symptoms of infection   Monitor lab/diagnostic results   Monitor all insertion sites (i.e. indwelling lines, tubes, urinary catheters, and drains)   Encourage patient and family to use good hand hygiene technique     Problem: Pain  Goal: Pain at adequate level as identified by patient  Outcome: Progressing  Flowsheets (Taken 08/06/2020 1656)  Pain at adequate level as identified by patient:   Identify patient comfort function goal   Assess for risk of opioid induced respiratory depression, including snoring/sleep apnea. Alert healthcare team of risk factors identified.   Assess pain on admission, during daily assessment and/or before any "as needed" intervention(s)   Reassess pain within 30-60 minutes of any procedure/intervention, per Pain Assessment, Intervention, Reassessment (AIR) Cycle   Evaluate if patient comfort function goal is met   Evaluate patient's satisfaction with pain management progress   Offer non-pharmacological pain management interventions     Problem: Side Effects from Pain Analgesia  Goal: Patient will experience minimal side  effects of analgesic therapy  Outcome: Progressing

## 2020-08-06 NOTE — Discharge Instr - AVS First Page (Addendum)
[facility] Inpatient Service  Discharge Instructions for  Bridget [name]    Date Printed: 08/06/20  Primary Doctor's Name: Reynold Bowen, MD, Fax: [phone] Phone: [phone]    Follow up time frame: Follow up with your PCP within 3-5 days, follow up with GI as scheduled in early January for your gastric emptying study. Make an appointment in the coming weeks to follow up with Dr. Cleotis Lema to re-evaluate your POTS    Subspecialists: Dr. Hayden Rasmussen (GI); Dr. Trula Slade (GI); Dr. Cleotis Lema (cardiology)    Please bring this plan and all your medications to your follow up appointments and any future hospital visits    Diagnosis: Intractable nausea and vomiting; gastroparesis     Medications:   Please take your medication as instructed in the AVS.    Current Discharge Medication List        START taking these medications    Details   amitriptyline (ELAVIL) 25 MG tablet Take 1 tablet (25 mg total) by mouth nightly  Qty: 30 tablet, Refills: 0      pantoprazole (PROTONIX) 40 MG tablet Take 1 tablet (40 mg total) by mouth 2 (two) times daily  Qty: 60 tablet, Refills: 0      sucralfate (CARAFATE) 1 GM/10ML suspension Take 10 mLs (1 g total) by mouth 4 (four) times daily as needed (before meals to help with reflux and nausea)  Qty: 420 mL, Refills: 0            Current Discharge Medication List        CONTINUE these medications which have NOT CHANGED    Details   calcium carbonate (TUMS) 500 MG chewable tablet Chew 1 tablet by mouth daily as needed for Heartburn      dicyclomine (BENTYL) 10 MG capsule Take 2 capsules (20 mg total) by mouth 4 (four) times daily as needed (abdominal pain/cramps)  Qty: 90 capsule, Refills: 1      norethindrone-ethinyl estradiol (ORTHO-NOVUM 1/35) 1-35 MG-MCG per tablet Take 1 tablet by mouth daily  Qty: 84 tablet, Refills: 3    Associated Diagnoses: Encounter for surveillance of contraceptive pills      ondansetron (ZOFRAN-ODT) 4 MG disintegrating tablet Take 1 tablet (4 mg total)  by mouth every 6 (six) hours as needed for Nausea  Qty: 12 tablet, Refills: 0      prochlorperazine (COMPAZINE) 5 MG tablet Take 1 tablet (5 mg total) by mouth every 6 (six) hours as needed for Nausea  Qty: 12 tablet, Refills: 0      promethazine (PHENERGAN) 25 MG suppository Place 1 suppository (25 mg total) rectally every 6 (six) hours as needed for Nausea  Qty: 12 suppository, Refills: 1      pyridostigmine (MESTINON) 60 MG tablet Take 60 mg by mouth 2 (two) times daily      dronabinol (MARINOL) 2.5 MG capsule Take 1 capsule (2.5 mg total) by mouth 2 (two) times daily  Qty: 60 capsule, Refills: 0           Current Discharge Medication List        STOP taking these medications       cloNIDine (CATAPRES) 0.1 MG tablet Comments:   Reason for Stopping:         omeprazole (PriLOSEC) 40 MG capsule Comments:   Reason for Stopping:               Special Instructions:   - Make an appointment to follow up with cardiology to discuss  your POTS before you restart clonidine. Your blood pressures and heart rate have been well-controlled in the ED, so touch base with Dr. Cleotis Lema before you go back on this medication  - STOP taking omeprazole and start taking pantoprazole (Protonix) instead  - You may go back to taking the Mestinon if you believe it helps your symptoms-- ask GI about their recommendations for you regarding this medication   - Your hospital team is recommending that you continue taking erythromycin twice daily as you were previously before you got sick-- follow up with GI to discuss this further   - We increased your dose of amitriptyline on discharge; you will now be taking 25mg  amitriptyline nightly   - We have prescribed you Carafate (stomach-coating liquid) to use as needed for nausea and acid reflux symptoms  - Try pre-treating with zofran if you are concerned you may have nausea with eating  - Try taking Pepcid Complete as needed for nausea and acid reflux symptoms. You may also consider adding one tablet  scheduled daily to help reduce and prevent acid before it gets bothersome and out of control   - Make sure you have snacks/food on hand throughout the day so you don't miss your window for eating when you're hungry before you begin to feel nauseous  - Eliminate processed, high sugar, and high fat foods from your diet, and stick to fresh, whole foods that will not cause inflammation in your body  - Make sure you are exercising regularly, as this will also help your symptoms. Try to aim for a total of 150 minutes or three 50-minute sessions of exercise a week. This can be any exercise that will get your heart rate up  - We highly advise that you get in touch with a good therapist and re-establish care with psychiatry/psychology as soon as you are able. Working on the mental health aspect of your care is a big part of the overall picture of you getting better.    Please try as hard as you can to not go back to smoking marijuana whenever you feel nauseous. The best thing is for you to try as hard as you can to avoid missing your window to eat so you don't get into the cycle of nausea and vomiting. If you do start to get into the cycle, Try taking your antacid and antinausea medications, including protonix, pepcid, and zofran. Try using the Carafate to coat your stomach so you can eat something. You should also continue taking the dronabinol and amitriptyline daily to help reduce these symptoms over time and hopefully cut down on the frequency of your nausea/vomiting spells. This will likely get better over time after several weeks of not smoking, but it will be a slow process.     Please feel free to reach out if you have any other questions or concerns.       [facility]  [phone]  [address]  [place], [place] [address]

## 2020-08-06 NOTE — Progress Notes (Signed)
Discharge Note:    LACE = 9  Placed in OBS for cyclic vomiting syndrome    [place] home by car with no CM needs.       08/06/20 1738   Discharge Disposition   Patient preference/choice provided? Yes   Physical Discharge Disposition Home   Mode of Transportation Car     Conception Chancy, [place], ACM-SW  Social Worker II Case Manager   [phone])

## 2020-08-06 NOTE — Progress Notes (Signed)
[facility] Attending Note    This patient was evaluated on rounds with Dr. Conan Bowens  Physical exam directly observed and duplicated.  I agree with assessment and plan as outlined.      Principal Problem:    Cyclic vomiting syndrome  Active Problems:    Dehydration      Patient significantly better today. Patient and mother are pushing to go home. We discussed risks of going home prematurely even though she is better since yesterday evening.    Has been tolerating Po well    Continue home acid reducing medications bid  Will increase amitriptyline to 25 mg   Continue Marinol    Discussed a routine exercise program 3 to 4 times a week  Discussed needs for CBT therapy    Ok for discharge after lunch    Barnie Alderman, MD , M.D.  Attending  [facility]  [phone]

## 2020-08-06 NOTE — Progress Notes (Signed)
Brief Nutrition Note    Tolerating PO now without N/V. Pt states she feels better today than she has in months, is eager to go home. Mother also encouraged by progress. [facility]FP team met with pt this AM and encouraged trauma therapy. Also with plan to switch erythromycin to liquid form, as pt refused pill form x 2 over past 24 hours. Pt reports intention to continue to refuse erythromycin for now while continues to feel good. Likely d/c home today. Will continue to follow.     Harl Favor, RD  Clinical Dietitian  Ext. [phone]

## 2020-08-07 ENCOUNTER — Telehealth (INDEPENDENT_AMBULATORY_CARE_PROVIDER_SITE_OTHER): Payer: Self-pay

## 2020-08-07 NOTE — Telephone Encounter (Signed)
A PA has been initiated through [employer] (Key: [id number]) [id number]  Sucralfate 1GM/10ML suspension       Status: New  Created: December 22nd, 2021 [phone]    Chart Reviewed and Formulary investigated     Formulary alternatives available plan covers sucralfate 1 gram tablets without a PA    Provider notified with recommended alternative

## 2020-08-07 NOTE — Telephone Encounter (Signed)
TCM Note:      1.??????Discharge date: 08/06/2020;  Admission: 08/04/2020     2.??????Hospital discharged from [facility]     3.??????Reason for visit, chief complaint, diagnosis: cyclic vomiting syndrome     4.??????How is the patient doing now? Any concerns? Issues with discharge instructions??Questions? Pt feeling much better now, able to have po intake. No questions on d/c summary, "I have done this before".       5.??????Is a follow up appointment indicated? Yes      6.??????Review medication list, update med list, and discuss any new allergies or?problems to add to "problem list": Med list reviewed and updated.     7.??????Other information- additional, pertinent detail regarding their admission and/or additional testing during admission??none     //8.??????Date and time of scheduled follow-up 08/11/2020 11:30 Dr. Danella Penton

## 2020-08-07 NOTE — Telephone Encounter (Signed)
Spoke with patient's mother on the phone, she would like to see what the Carafate will cost out of pocket at the pharmacy, and if it is too expensive, she will opt for either liquid Maalox or liquid Gaviscon, as the plan was for pt to have a medication that will coat her stomach and help with acid reflux. No need to switch Rx to tablet form at this time.

## 2020-08-11 ENCOUNTER — Ambulatory Visit (INDEPENDENT_AMBULATORY_CARE_PROVIDER_SITE_OTHER): Payer: No Typology Code available for payment source | Admitting: Family Medicine

## 2020-08-11 ENCOUNTER — Encounter (INDEPENDENT_AMBULATORY_CARE_PROVIDER_SITE_OTHER): Payer: Self-pay | Admitting: Family Medicine

## 2020-08-11 VITALS — BP 110/80 | HR 98 | Temp 99.7°F | Resp 14 | Ht 64.2 in | Wt 98.0 lb

## 2020-08-11 DIAGNOSIS — J101 Influenza due to other identified influenza virus with other respiratory manifestations: Secondary | ICD-10-CM

## 2020-08-11 DIAGNOSIS — R509 Fever, unspecified: Secondary | ICD-10-CM

## 2020-08-11 DIAGNOSIS — K30 Functional dyspepsia: Secondary | ICD-10-CM

## 2020-08-11 DIAGNOSIS — R1115 Cyclical vomiting syndrome unrelated to migraine: Secondary | ICD-10-CM

## 2020-08-11 DIAGNOSIS — G90A Postural orthostatic tachycardia syndrome (POTS): Secondary | ICD-10-CM

## 2020-08-11 DIAGNOSIS — Z09 Encounter for follow-up examination after completed treatment for conditions other than malignant neoplasm: Secondary | ICD-10-CM

## 2020-08-11 DIAGNOSIS — I498 Other specified cardiac arrhythmias: Secondary | ICD-10-CM

## 2020-08-11 DIAGNOSIS — Z20822 Contact with and (suspected) exposure to covid-19: Secondary | ICD-10-CM

## 2020-08-11 LAB — IHS AMB POCT SOFIA (TM) COVID-19 & FLU A/B
Sofia Influenza A Ag POCT: POSITIVE — AB
Sofia Influenza B Ag POCT: NEGATIVE
Sofia SARS COV2 Antigen POCT: NEGATIVE

## 2020-08-11 MED ORDER — OSELTAMIVIR PHOSPHATE 75 MG PO CAPS
75.0000 mg | ORAL_CAPSULE | Freq: Two times a day (BID) | ORAL | 0 refills | Status: AC
Start: 2020-08-11 — End: 2020-08-16

## 2020-08-11 NOTE — Progress Notes (Signed)
[facility] [place] [facility]                       Date of Exam: 08/11/2020 11:35 AM        Patient ID: Bridget [name] is a 24 y.o. female.  Attending Physician: Reynold Bowen, MD        Chief Complaint:    Chief Complaint   Patient presents with   . Hospital Follow-up     for Nausea & vomittin   . Covid-19 Screening   . URI   . Other     med SE, suspects amitriptyline is causing rash, stopped takin 2 days ago               HPI:    Pt presents for hospital follow up. Was admitted 12/20 - 12/22 for intractable nausea and vomiting. Was started on protonix in the hospital with improvement in symptoms; believes that she was tried on it previously, but not effective at that time. Scheduled for gastric emptying test on Monday and has outpatient GI follow up.     Pt also endorses productive cough. Symptoms started yesterday. Endorses subjective fever, chills, diarrhea. Last subjective fever was last night. Endorses rhinorrhea today. Denies anosmia, or myalgias. Her boyfriend was sick on 12/24 with similar symptoms and tested negative for COVID. Pt has had COVID vaccines. Last had advil 3 hours ago.      TCM Note:      1. Discharge date: 08/06/2020;  Admission: 08/04/2020     2. Hospital discharged from [facility]     3. Reason for visit, chief complaint, diagnosis: cyclic vomiting syndrome     4. How is the patient doing now? Any concerns? Issues with discharge instructions??Questions? Pt feeling much better now, able to have po intake. No questions on d/c summary, "I have done this before".       5. Is a follow up appointment indicated? Yes      6. Review medication list, update med list, and discuss any new allergies or?problems to add to "problem list": Med list reviewed and updated.     7. Other information- additional, pertinent detail regarding their admission and/or additional testing during admission??none     8. Date and time of scheduled follow-up 08/11/2020 11:30  Dr. Danella Penton               Problem List:    Patient Active Problem List   Diagnosis   . Asthma   . Delayed gastric emptying   . Ehlers-Danlos syndrome   . Endometriosis   . Postural orthostatic tachycardia syndrome   . ASCUS with positive high risk HPV cervical   . Cyclic vomiting syndrome   . Dehydration             Current Meds:    Outpatient Medications Marked as Taking for the 08/11/20 encounter (Office Visit) with Reynold Bowen, MD   Medication Sig Dispense Refill   . calcium carbonate (TUMS) 500 MG chewable tablet Chew 1 tablet by mouth daily as needed for Heartburn     . dicyclomine (BENTYL) 10 MG capsule Take 2 capsules (20 mg total) by mouth 4 (four) times daily as needed (abdominal pain/cramps) 90 capsule 1   . dronabinol (MARINOL) 2.5 MG capsule Take 1 capsule (2.5 mg total) by mouth 2 (two) times daily 60 capsule 0   . norethindrone-ethinyl estradiol (ORTHO-NOVUM 1/35) 1-35 MG-MCG per tablet Take 1  tablet by mouth daily 84 tablet 3   . ondansetron (ZOFRAN-ODT) 4 MG disintegrating tablet Take 1 tablet (4 mg total) by mouth every 6 (six) hours as needed for Nausea 12 tablet 0   . pantoprazole (PROTONIX) 40 MG tablet Take 1 tablet (40 mg total) by mouth 2 (two) times daily 60 tablet 0   . prochlorperazine (COMPAZINE) 5 MG tablet Take 1 tablet (5 mg total) by mouth every 6 (six) hours as needed for Nausea 12 tablet 0   . promethazine (PHENERGAN) 12.5 MG tablet Take 12.5 mg by mouth every 6 (six) hours as needed for Nausea     . promethazine (PHENERGAN) 25 MG suppository Place 1 suppository (25 mg total) rectally every 6 (six) hours as needed for Nausea 12 suppository 1   . pyridostigmine (MESTINON) 60 MG tablet Take 60 mg by mouth 2 (two) times daily     . sucralfate (CARAFATE) 1 GM/10ML suspension Take 10 mLs (1 g total) by mouth 4 (four) times daily as needed (before meals to help with reflux and nausea) 420 mL 0          Allergies:    Allergies   Allergen Reactions   . Haloperidol      Interacts with reglan  and also pt had involuntary eye movements.    . Latex Itching     Itching irritation swelling condoms  Itching irritation swelling condoms  Itching irritation swelling condoms   . Reglan [Metoclopramide]    . Morphine Rash             Past Surgical History:    Past Surgical History:   Procedure Laterality Date   . ADENOIDECTOMY     . EGD N/A 01/23/2018    Procedure: EGD;  Surgeon: Jacqulyn Cane, MD;  Location: [facility];  Service: Gastroenterology;  Laterality: N/A;  EGD  Q1=N/A   . TONSILLECTOMY     . WISDOM TOOTH EXTRACTION             Family History:    Family History   Problem Relation Age of Onset   . Cancer Mother    . Ehlers-Danlos syndrome Sister    . Asthma Brother            Social History:    Social History     Tobacco Use   . Smoking status: Never Smoker   . Smokeless tobacco: Never Used   . Tobacco comment: the patient vapes nicotine    Vaping Use   . Vaping Use: Every day   . Start date: 04/14/2018   Substance Use Topics   . Alcohol use: Not Currently   . Drug use: Yes     Types: Marijuana     Comment: Pt. uses marijuana every day.          The following sections were reviewed this encounter by the provider:   Tobacco  Allergies  Meds  Problems  Med Hx  Surg Hx  Fam Hx             Vital Signs:    BP 110/80 (BP Site: Right arm, Patient Position: Sitting, Cuff Size: Small)   Pulse 98   Temp 99.7 F (37.6 C) (Tympanic)   Resp 14   Ht 1.631 m (5' 4.2")   Wt (!) 44.5 kg (98 lb)   BMI 16.72 kg/m          ROS:     ROS per HPI; all other systems  reviewed and are negative            Physical Exam:    Physical Exam  Vitals reviewed.   Constitutional:       General: She is not in acute distress.     Appearance: Normal appearance. She is normal weight. She is not ill-appearing, toxic-appearing or diaphoretic.   HENT:      Head: Normocephalic.   Eyes:      Conjunctiva/sclera: Conjunctivae normal.   Cardiovascular:      Rate and Rhythm: Normal rate and regular rhythm.   Pulmonary:       Effort: Pulmonary effort is normal. No respiratory distress.      Breath sounds: Normal breath sounds. No wheezing.   Abdominal:      General: There is no distension.      Palpations: Abdomen is soft.      Tenderness: There is no abdominal tenderness.   Neurological:      Mental Status: She is alert and oriented to person, place, and time. Mental status is at baseline.   Psychiatric:         Mood and Affect: Mood normal.         Behavior: Behavior normal.         Thought Content: Thought content normal.         Judgment: Judgment normal.              Assessment:    1. Hospital discharge follow-up    2. Postural orthostatic tachycardia syndrome    3. Delayed gastric emptying    4. Cyclic vomiting syndrome    5. Exposure to COVID-19 virus    6. Subjective fever  - Sofia(TM) SARS COVID19 & Flu A/B POCT    7. Influenza A  - oseltamivir (TAMIFLU) 75 MG capsule; Take 1 capsule (75 mg total) by mouth 2 (two) times daily for 5 days  Dispense: 10 capsule; Refill: 0                Follow-up:    Return if symptoms worsen or fail to improve.         Reynold Bowen, MD

## 2020-08-12 ENCOUNTER — Encounter (INDEPENDENT_AMBULATORY_CARE_PROVIDER_SITE_OTHER): Payer: Self-pay | Admitting: Family Medicine

## 2020-08-18 ENCOUNTER — Encounter (INDEPENDENT_AMBULATORY_CARE_PROVIDER_SITE_OTHER): Payer: Self-pay

## 2020-08-18 ENCOUNTER — Telehealth (INDEPENDENT_AMBULATORY_CARE_PROVIDER_SITE_OTHER): Payer: No Typology Code available for payment source | Admitting: Nurse Practitioner

## 2020-08-18 DIAGNOSIS — J206 Acute bronchitis due to rhinovirus: Secondary | ICD-10-CM

## 2020-08-18 DIAGNOSIS — J4521 Mild intermittent asthma with (acute) exacerbation: Secondary | ICD-10-CM

## 2020-08-18 DIAGNOSIS — I499 Cardiac arrhythmia, unspecified: Secondary | ICD-10-CM | POA: Insufficient documentation

## 2020-08-18 MED ORDER — ALBUTEROL SULFATE HFA 108 (90 BASE) MCG/ACT IN AERS
2.0000 | INHALATION_SPRAY | RESPIRATORY_TRACT | 1 refills | Status: DC | PRN
Start: 2020-08-18 — End: 2021-03-13

## 2020-08-18 MED ORDER — FLOVENT HFA 110 MCG/ACT IN AERO
1.0000 | INHALATION_SPRAY | Freq: Two times a day (BID) | RESPIRATORY_TRACT | 1 refills | Status: DC
Start: 2020-08-18 — End: 2021-03-13

## 2020-08-18 NOTE — Progress Notes (Signed)
Pt is alone in the room. Pt is in [place]. Verified pt's driver's license.           [name] [place] [facility]               Patient: Bridget [name]   Date of Service: 08/18/2020   MRN: [id number]       Bridget [name] is a 25 y.o. female      HISTORY     Chief Complaint   Patient presents with   . Cough     over 1 week. Pt had flu A last week.         HPI   Verbal consent has been obtained from the patient to conduct a video and telephone: {YES)    25 year old female with Asthma presents by video with persistent cough  Patient was diagnosed with Influenza a week ago, she had negative covid screen at that time. Symptoms improving except for cough  Cough productive, coughs up greenish sputum  Also with chest tightness and wheezing  Denies chest pain  No tachycardia  No fevers or chills  No GI sx's  No rash  Asthma triggered by illness and has not used inhalers  Treatment tried- Delsym not helping  Covid vaccine x 2 done, booster due  LMP 1 week ago      Review of Systems: per HPI    History:  Past Medical History:   Diagnosis Date   . Abdominal pain    . Asthma     mild seasonal controlled   . Complication of anesthesia    . Cyclical vomiting    . Ehlers-Danlos disease    . Gastroparesis    . Post-operative nausea and vomiting    . POTS (postural orthostatic tachycardia syndrome)     [name] [place]/stable for many yrs   . Sinus trouble     seasonal   . UTI (urinary tract infection)    . Yeast infection involving the vagina and surrounding area 05/09/2015       Past Surgical History:   Procedure Laterality Date   . ADENOIDECTOMY     . EGD N/A 01/23/2018    Procedure: EGD;  Surgeon: Jacqulyn Cane, MD;  Location: [facility];  Service: Gastroenterology;  Laterality: N/A;  EGD  Q1=N/A   . TONSILLECTOMY     . WISDOM TOOTH EXTRACTION         Family History   Problem Relation Age of Onset   . Cancer Mother    . Ehlers-Danlos syndrome Sister    . Asthma Brother         Social History     Tobacco Use   . Smoking status: Never Smoker   . Smokeless tobacco: Never Used   . Tobacco comment: the patient vapes nicotine    Vaping Use   . Vaping Use: Some days   . Start date: 04/14/2018   Substance Use Topics   . Alcohol use: Not Currently   . Drug use: Yes     Types: Marijuana     Comment: Pt. uses marijuana every day.       History reviewed.        Current Outpatient Medications:   .  calcium carbonate (TUMS) 500 MG chewable tablet, Chew 1 tablet by mouth daily as needed for Heartburn, Disp: , Rfl:   .  dicyclomine (BENTYL) 10 MG capsule, Take 2 capsules (20  mg total) by mouth 4 (four) times daily as needed (abdominal pain/cramps), Disp: 90 capsule, Rfl: 1  .  norethindrone-ethinyl estradiol (ORTHO-NOVUM 1/35) 1-35 MG-MCG per tablet, Take 1 tablet by mouth daily, Disp: 84 tablet, Rfl: 3  .  ondansetron (ZOFRAN-ODT) 4 MG disintegrating tablet, Take 1 tablet (4 mg total) by mouth every 6 (six) hours as needed for Nausea, Disp: 12 tablet, Rfl: 0  .  pantoprazole (PROTONIX) 40 MG tablet, Take 1 tablet (40 mg total) by mouth 2 (two) times daily (Patient taking differently: Take 40 mg by mouth as needed  ), Disp: 60 tablet, Rfl: 0  .  promethazine (PHENERGAN) 12.5 MG tablet, Take 12.5 mg by mouth every 6 (six) hours as needed for Nausea, Disp: , Rfl:   .  promethazine (PHENERGAN) 25 MG suppository, Place 1 suppository (25 mg total) rectally every 6 (six) hours as needed for Nausea, Disp: 12 suppository, Rfl: 1  .  pyridostigmine (MESTINON) 60 MG tablet, Take 60 mg by mouth 2 (two) times daily, Disp: , Rfl:   .  sucralfate (CARAFATE) 1 GM/10ML suspension, Take 10 mLs (1 g total) by mouth 4 (four) times daily as needed (before meals to help with reflux and nausea), Disp: 420 mL, Rfl: 0  .  albuterol sulfate HFA (PROVENTIL) 108 (90 Base) MCG/ACT inhaler, Inhale 2 puffs into the lungs every 4 (four) hours as needed for Wheezing or Shortness of Breath, Disp: 6.7 g, Rfl: 1  .  amitriptyline  (ELAVIL) 25 MG tablet, Take 1 tablet (25 mg total) by mouth nightly, Disp: 30 tablet, Rfl: 0  .  dronabinol (MARINOL) 2.5 MG capsule, Take 1 capsule (2.5 mg total) by mouth 2 (two) times daily, Disp: 60 capsule, Rfl: 0  .  Erythromycin 250 MG Tablet Delayed Response, Take 1 tablet (250 mg total) by mouth 2 (two) times daily for 14 days, Disp: 28 tablet, Rfl: 0  .  fluticasone (Flovent HFA) 110 MCG/ACT inhaler, Inhale 1 puff into the lungs 2 (two) times daily, Disp: 12 g, Rfl: 1  .  prochlorperazine (COMPAZINE) 5 MG tablet, Take 1 tablet (5 mg total) by mouth every 6 (six) hours as needed for Nausea, Disp: 12 tablet, Rfl: 0    Allergies   Allergen Reactions   . Haloperidol      Interacts with reglan and also pt had involuntary eye movements.    . Latex Itching     Itching irritation swelling condoms  Itching irritation swelling condoms  Itching irritation swelling condoms   . Reglan [Metoclopramide]    . Morphine Rash       Medications and Allergies reviewed.    PHYSICAL EXAM     There were no vitals filed for this visit.    Physical Exam  Exam performed by observation via telemedicine camera and via maneuvers and palpation directed by me, reported by patient     GENERAL; Well appearing on camera, in NAD  HEENT: atraumatic, normocephalic  - Sclera is not red, no periorbital edema  - Normal hearing  - No rhinorrhea or sinus tenderness  - tenderness anterior cervical    RESP: normal effort, no wheezing  PSY: goal oriented, speech clear and coherent    UCC COURSE       Results     ** No results found for the last 24 hours. **            No results found.      Orders Placed This Encounter  Medications   . fluticasone (Flovent HFA) 110 MCG/ACT inhaler     Sig: Inhale 1 puff into the lungs 2 (two) times daily     Dispense:  12 g     Refill:  1   . albuterol sulfate HFA (PROVENTIL) 108 (90 Base) MCG/ACT inhaler     Sig: Inhale 2 puffs into the lungs every 4 (four) hours as needed for Wheezing or Shortness of Breath      Dispense:  6.7 g     Refill:  1         PROCEDURES     Procedures       ASSESSMENT     Encounter Diagnoses   Name Primary?   . Acute bronchitis due to Rhinovirus Yes   . Mild intermittent asthma with acute exacerbation           SSESSMENT    PLAN     25 year old female with Asthma seen via video today with bronchitis, patient well appearing, afebrile.  Albuterol 2 puffs every 4-6 hours as needed for cough, chest tightness or shortness of breath,  Flovent 1 puff twice a day, rinse mouth after use, increase to 2 puffs twice a day if you are still using Albuterol every 4-6 hours .    Treatment initiated based on my best clinical judgement. Patient advised to contact the office if she does not feel well     Discussed results and diagnosis with patient/family.  Reviewed warning signs for worsening condition, as well as, indications for follow-up with pmd and return to urgent care clinic.   Patient/family expressed understanding of instructions.    No orders of the defined types were placed in this encounter.        An After Visit Summary was printed and given to the patient.      Signed,  Ledell Peoples, DNP NP

## 2020-11-21 ENCOUNTER — Encounter (INDEPENDENT_AMBULATORY_CARE_PROVIDER_SITE_OTHER): Payer: Self-pay

## 2020-11-22 ENCOUNTER — Encounter (INDEPENDENT_AMBULATORY_CARE_PROVIDER_SITE_OTHER): Payer: Self-pay

## 2020-12-21 ENCOUNTER — Encounter (INDEPENDENT_AMBULATORY_CARE_PROVIDER_SITE_OTHER): Payer: Self-pay

## 2020-12-22 ENCOUNTER — Encounter (INDEPENDENT_AMBULATORY_CARE_PROVIDER_SITE_OTHER): Payer: Self-pay

## 2021-01-06 NOTE — Progress Notes (Signed)
01/06/21 0916   Pt Outreach/Care Plan Metrics   Payor Grouping for Outreach Other  [facility])   Outreach Status for Metrics listing Screened Not Appropriate     Nurse Navigator Follow Up  Screened not Appropriate    Pt from [place] report.   Reviewed patient's chart and screened not appropriate for CM Ambulatory Criteria.     Alyson Ingles RN, BSN  Patient Care Nurse Navigator                                                                                                   [facility] Management                                                                                                                             T [phone]  F [phone]

## 2021-01-21 ENCOUNTER — Encounter (INDEPENDENT_AMBULATORY_CARE_PROVIDER_SITE_OTHER): Payer: Self-pay

## 2021-01-22 ENCOUNTER — Encounter (INDEPENDENT_AMBULATORY_CARE_PROVIDER_SITE_OTHER): Payer: Self-pay

## 2021-02-06 ENCOUNTER — Telehealth (INDEPENDENT_AMBULATORY_CARE_PROVIDER_SITE_OTHER): Payer: Self-pay

## 2021-02-06 DIAGNOSIS — Z3041 Encounter for surveillance of contraceptive pills: Secondary | ICD-10-CM

## 2021-02-06 NOTE — Telephone Encounter (Signed)
LDVMTCB, pt needs to schedule annual visit prior to running out of meds.  Please assist to schedule WWE.  (Request for Nortrel refill denied per NP Erroll Luna,  pt needs to schedule WWE).

## 2021-02-09 NOTE — Telephone Encounter (Signed)
Pt has left a message on the after hours voicemail request a refill of her birth control     2nd attempt- called the pt to schedule her WWE LPE 03/13/20   No answer / no VM

## 2021-02-10 NOTE — Progress Notes (Signed)
Nurse Navigator reviewed patient chart and screened not appropriate for Care Management Program.     Vidalia Serpas R., RN, BSN, CDCES, ACM-RN   Patient Care Nurse Navigator  Ambulatory Care Management   T [phone]

## 2021-02-13 NOTE — Telephone Encounter (Signed)
Pt called and requested a call back from Nurse or provider. Pt declined scheduling a WWE, as pt will schedule appt with OB/GYN. Pt stated she still needs refill for birth control. Pt stated she will run out of medication before her appointment with OB/GYN. Pt can be reached at ph. [phone]. Please advise.

## 2021-02-13 NOTE — Telephone Encounter (Signed)
Disregard- I do not even see her medication listed.  She needs to get it from [facility]

## 2021-02-13 NOTE — Telephone Encounter (Signed)
Pt says she has 1 week left of OCP, and prior prescriber Dr. Danella Penton, Rx for norethindrone-ethinyl estradiol (ortho novum1/35 mg daily on 03/13/2020.  Please advise.  Pt has not scheduled any visit for OB/GYN.

## 2021-02-13 NOTE — Telephone Encounter (Signed)
Please advise pt I will order a 1 month supply. Thanks

## 2021-02-16 MED ORDER — NORETHINDRONE-ETH ESTRADIOL 1-35 MG-MCG PO TABS
1.0000 | ORAL_TABLET | Freq: Every day | ORAL | 0 refills | Status: AC
Start: 2021-02-16 — End: ?

## 2021-02-16 NOTE — Addendum Note (Signed)
Addended by: Rona Ravens on: 02/16/2021 04:13 PM     Modules accepted: Orders

## 2021-02-16 NOTE — Telephone Encounter (Signed)
1 month refill provided.

## 2021-02-18 NOTE — Progress Notes (Signed)
02/18/21 1514   Pt Outreach/Care Plan Metrics   Payor Grouping for Outreach [employer] Status for [employer] Screened Not Appropriate     Nurse Navigator reviewed patient chart and screened not appropriate for Care Management Program.     Arva Chafe., RN, BSN, [place], ACM-RN   Patient Care Nurse Navigator  Ambulatory Care Management   T [phone]

## 2021-02-20 ENCOUNTER — Encounter (INDEPENDENT_AMBULATORY_CARE_PROVIDER_SITE_OTHER): Payer: Self-pay

## 2021-02-21 ENCOUNTER — Encounter (INDEPENDENT_AMBULATORY_CARE_PROVIDER_SITE_OTHER): Payer: Self-pay

## 2021-03-04 ENCOUNTER — Encounter (INDEPENDENT_AMBULATORY_CARE_PROVIDER_SITE_OTHER): Payer: Self-pay

## 2021-03-13 ENCOUNTER — Ambulatory Visit
Admission: EM | Admit: 2021-03-13 | Discharge: 2021-03-13 | Disposition: A | Discharge status: Home / Self Care | Payer: No Typology Code available for payment source | Attending: Family Medicine | Admitting: Family Medicine

## 2021-03-13 DIAGNOSIS — F1290 Cannabis use, unspecified, uncomplicated: Secondary | ICD-10-CM | POA: Insufficient documentation

## 2021-03-13 DIAGNOSIS — Z79899 Other long term (current) drug therapy: Secondary | ICD-10-CM | POA: Insufficient documentation

## 2021-03-13 DIAGNOSIS — J45909 Unspecified asthma, uncomplicated: Secondary | ICD-10-CM | POA: Insufficient documentation

## 2021-03-13 DIAGNOSIS — E872 Acidosis, unspecified: Secondary | ICD-10-CM

## 2021-03-13 DIAGNOSIS — I498 Other specified cardiac arrhythmias: Secondary | ICD-10-CM | POA: Insufficient documentation

## 2021-03-13 DIAGNOSIS — R002 Palpitations: Secondary | ICD-10-CM | POA: Insufficient documentation

## 2021-03-13 DIAGNOSIS — Z7951 Long term (current) use of inhaled steroids: Secondary | ICD-10-CM | POA: Insufficient documentation

## 2021-03-13 DIAGNOSIS — E860 Dehydration: Secondary | ICD-10-CM

## 2021-03-13 DIAGNOSIS — K3184 Gastroparesis: Secondary | ICD-10-CM | POA: Insufficient documentation

## 2021-03-13 DIAGNOSIS — Q796 Ehlers-Danlos syndrome, unspecified: Secondary | ICD-10-CM | POA: Insufficient documentation

## 2021-03-13 DIAGNOSIS — E162 Hypoglycemia, unspecified: Secondary | ICD-10-CM

## 2021-03-13 DIAGNOSIS — Z793 Long term (current) use of hormonal contraceptives: Secondary | ICD-10-CM | POA: Insufficient documentation

## 2021-03-13 DIAGNOSIS — N946 Dysmenorrhea, unspecified: Secondary | ICD-10-CM | POA: Insufficient documentation

## 2021-03-13 DIAGNOSIS — R079 Chest pain, unspecified: Secondary | ICD-10-CM

## 2021-03-13 DIAGNOSIS — R1110 Vomiting, unspecified: Secondary | ICD-10-CM | POA: Insufficient documentation

## 2021-03-13 LAB — CBC AND DIFFERENTIAL
Absolute NRBC: 0 10*3/uL (ref 0.00–0.00)
Basophils Absolute Automated: 0.08 10*3/uL (ref 0.00–0.08)
Basophils Automated: 0.9 %
Eosinophils Absolute Automated: 0.01 10*3/uL (ref 0.00–0.44)
Eosinophils Automated: 0.1 %
Hematocrit: 49.4 % — ABNORMAL HIGH (ref 34.7–43.7)
Hgb: 16.9 g/dL — ABNORMAL HIGH (ref 11.4–14.8)
Immature Granulocytes Absolute: 0.03 10*3/uL (ref 0.00–0.07)
Immature Granulocytes: 0.4 %
Lymphocytes Absolute Automated: 2.23 10*3/uL (ref 0.42–3.22)
Lymphocytes Automated: 26.2 %
MCH: 30.7 pg (ref 25.1–33.5)
MCHC: 34.2 g/dL (ref 31.5–35.8)
MCV: 89.7 fL (ref 78.0–96.0)
MPV: 9.5 fL (ref 8.9–12.5)
Monocytes Absolute Automated: 0.55 10*3/uL (ref 0.21–0.85)
Monocytes: 6.5 %
Neutrophils Absolute: 5.61 10*3/uL (ref 1.10–6.33)
Neutrophils: 65.9 %
Nucleated RBC: 0 /100 WBC (ref 0.0–0.0)
Platelets: 371 10*3/uL — ABNORMAL HIGH (ref 142–346)
RBC: 5.51 10*6/uL — ABNORMAL HIGH (ref 3.90–5.10)
RDW: 13 % (ref 11–15)
WBC: 8.51 10*3/uL (ref 3.10–9.50)

## 2021-03-13 LAB — HEPATIC FUNCTION PANEL
ALT: 16 U/L (ref 0–55)
AST (SGOT): 19 U/L (ref 5–34)
Albumin/Globulin Ratio: 1.5 (ref 0.9–2.2)
Albumin: 5.4 g/dL — ABNORMAL HIGH (ref 3.5–5.0)
Alkaline Phosphatase: 79 U/L (ref 37–106)
Bilirubin Direct: 0.4 mg/dL (ref 0.0–0.5)
Bilirubin Indirect: 0.9 mg/dL (ref 0.2–1.0)
Bilirubin, Total: 1.3 mg/dL — ABNORMAL HIGH (ref 0.2–1.2)
Globulin: 3.5 g/dL (ref 2.0–3.6)
Protein, Total: 8.9 g/dL — ABNORMAL HIGH (ref 6.0–8.3)

## 2021-03-13 LAB — BASIC METABOLIC PANEL
Anion Gap: 25 — ABNORMAL HIGH (ref 5.0–15.0)
BUN: 11 mg/dL (ref 7–19)
CO2: 15 mEq/L — ABNORMAL LOW (ref 22–29)
Calcium: 10.2 mg/dL (ref 8.5–10.5)
Chloride: 96 mEq/L — ABNORMAL LOW (ref 100–111)
Creatinine: 0.8 mg/dL (ref 0.6–1.0)
Glucose: 66 mg/dL — ABNORMAL LOW (ref 70–100)
Potassium: 4.1 mEq/L (ref 3.5–5.1)
Sodium: 136 mEq/L (ref 136–145)

## 2021-03-13 LAB — URINALYSIS REFLEX TO MICROSCOPIC EXAM - REFLEX TO CULTURE
Bilirubin, UA: NEGATIVE
Glucose, UA: NEGATIVE
Ketones UA: 80 — AB
Leukocyte Esterase, UA: NEGATIVE
Nitrite, UA: NEGATIVE
Protein, UR: 30 — AB
Specific Gravity UA: 1.027 (ref 1.001–1.035)
Urine pH: 6 (ref 5.0–8.0)
Urobilinogen, UA: NORMAL mg/dL (ref 0.2–2.0)

## 2021-03-13 LAB — TSH: TSH: 0.75 u[IU]/mL (ref 0.35–4.94)

## 2021-03-13 LAB — HCG, SERUM, QUALITATIVE: Hcg Qualitative: NEGATIVE

## 2021-03-13 LAB — GFR: EGFR: >60

## 2021-03-13 LAB — LIPASE: Lipase: 31 U/L (ref 8–78)

## 2021-03-13 LAB — GLUCOSE WHOLE BLOOD - POCT
Whole Blood Glucose POCT: 103 mg/dL — ABNORMAL HIGH (ref 70–100)
Whole Blood Glucose POCT: 111 mg/dL — ABNORMAL HIGH (ref 70–100)

## 2021-03-13 LAB — TROPONIN I: Troponin I: <0.01 ng/mL (ref 0.00–0.05)

## 2021-03-13 MED ORDER — DEXTROSE 50 % IV SOLN
25.0000 g | INTRAVENOUS | Status: DC | PRN
Start: 2021-03-13 — End: 2021-03-14

## 2021-03-13 MED ORDER — PANTOPRAZOLE SODIUM 40 MG IV SOLR
40.0000 mg | Freq: Once | INTRAVENOUS | Status: AC
Start: 2021-03-13 — End: 2021-03-13
  Administered 2021-03-13: 40 mg via INTRAVENOUS
  Filled 2021-03-13: qty 40

## 2021-03-13 MED ORDER — DEXTROSE 10 % IV BOLUS
25.0000 g | INTRAVENOUS | Status: DC | PRN
Start: 2021-03-13 — End: 2021-03-14

## 2021-03-13 MED ORDER — SODIUM CHLORIDE 0.9 % IV SOLN
12.5000 mg | Freq: Four times a day (QID) | INTRAVENOUS | Status: DC | PRN
Start: 2021-03-13 — End: 2021-03-14
  Filled 2021-03-13: qty 1

## 2021-03-13 MED ORDER — MELATONIN 3 MG PO TABS
3.0000 mg | ORAL_TABLET | Freq: Every evening | ORAL | Status: DC | PRN
Start: 2021-03-13 — End: 2021-03-14

## 2021-03-13 MED ORDER — ENOXAPARIN SODIUM 40 MG/0.4ML IJ SOSY
40.0000 mg | PREFILLED_SYRINGE | Freq: Every day | INTRAMUSCULAR | Status: DC
Start: 2021-03-13 — End: 2021-03-14
  Administered 2021-03-13: 40 mg via SUBCUTANEOUS
  Filled 2021-03-13: qty 0.4

## 2021-03-13 MED ORDER — PROCHLORPERAZINE MALEATE 5 MG PO TABS
5.0000 mg | ORAL_TABLET | Freq: Four times a day (QID) | ORAL | Status: DC | PRN
Start: 2021-03-13 — End: 2021-03-14
  Filled 2021-03-13: qty 1

## 2021-03-13 MED ORDER — GLUCAGON 1 MG IJ SOLR (WRAP)
1.0000 mg | INTRAMUSCULAR | Status: DC | PRN
Start: 2021-03-13 — End: 2021-03-14

## 2021-03-13 MED ORDER — NALOXONE HCL 0.4 MG/ML IJ SOLN (WRAP)
0.2000 mg | INTRAMUSCULAR | Status: DC | PRN
Start: 2021-03-13 — End: 2021-03-14

## 2021-03-13 MED ORDER — NORTRIPTYLINE HCL 25 MG PO CAPS
50.0000 mg | ORAL_CAPSULE | Freq: Every evening | ORAL | Status: DC
Start: 2021-03-13 — End: 2021-03-14
  Filled 2021-03-13: qty 2

## 2021-03-13 MED ORDER — LACTATED RINGERS IV BOLUS
1000.0000 mL | Freq: Once | INTRAVENOUS | Status: DC
Start: 2021-03-13 — End: 2021-03-14

## 2021-03-13 MED ORDER — DEXTROSE 5% IV BOLUS
250.0000 mL | INTRAVENOUS | Status: DC | PRN
Start: 2021-03-13 — End: 2021-03-14

## 2021-03-13 MED ORDER — PYRIDOSTIGMINE BROMIDE 60 MG PO TABS
60.0000 mg | ORAL_TABLET | Freq: Two times a day (BID) | ORAL | Status: DC
Start: 2021-03-13 — End: 2021-03-14
  Filled 2021-03-13: qty 1

## 2021-03-13 MED ORDER — NORTRIPTYLINE HCL 50 MG PO CAPS
50.0000 mg | ORAL_CAPSULE | Freq: Every evening | ORAL | Status: DC
Start: 2021-03-13 — End: 2021-03-15

## 2021-03-13 MED ORDER — POTASSIUM CHLORIDE CRYS ER 20 MEQ PO TBCR
0.0000 meq | EXTENDED_RELEASE_TABLET | ORAL | Status: DC | PRN
Start: 2021-03-13 — End: 2021-03-14

## 2021-03-13 MED ORDER — SODIUM CHLORIDE 0.9 % IV BOLUS
1000.0000 mL | Freq: Once | INTRAVENOUS | Status: AC
Start: 2021-03-13 — End: 2021-03-13
  Administered 2021-03-13: 1000 mL via INTRAVENOUS

## 2021-03-13 MED ORDER — ONDANSETRON HCL 4 MG/2ML IJ SOLN
4.0000 mg | Freq: Four times a day (QID) | INTRAMUSCULAR | Status: DC | PRN
Start: 2021-03-13 — End: 2021-03-14
  Filled 2021-03-13: qty 2

## 2021-03-13 MED ORDER — ONDANSETRON HCL 4 MG/2ML IJ SOLN
4.0000 mg | Freq: Once | INTRAMUSCULAR | Status: DC
Start: 2021-03-13 — End: 2021-03-13

## 2021-03-13 MED ORDER — MAGNESIUM SULFATE IN D5W 1-5 GM/100ML-% IV SOLN
1.0000 g | INTRAVENOUS | Status: DC | PRN
Start: 2021-03-13 — End: 2021-03-14

## 2021-03-13 MED ORDER — ONDANSETRON HCL 4 MG/2ML IJ SOLN
4.0000 mg | Freq: Four times a day (QID) | INTRAMUSCULAR | Status: DC
Start: 2021-03-13 — End: 2021-03-14
  Administered 2021-03-13: 4 mg via INTRAVENOUS

## 2021-03-13 MED ORDER — PANTOPRAZOLE SODIUM 40 MG IV SOLR
40.0000 mg | Freq: Two times a day (BID) | INTRAVENOUS | Status: DC
Start: 2021-03-13 — End: 2021-03-14
  Administered 2021-03-13: 40 mg via INTRAVENOUS
  Filled 2021-03-13: qty 40

## 2021-03-13 MED ORDER — SODIUM CHLORIDE 0.9 % IV SOLN
12.5000 mg | Freq: Once | INTRAVENOUS | Status: AC
Start: 2021-03-13 — End: 2021-03-13
  Administered 2021-03-13: 12.5 mg via INTRAVENOUS
  Filled 2021-03-13: qty 1

## 2021-03-13 MED ORDER — SODIUM CHLORIDE 0.9 % IV SOLN
INTRAVENOUS | Status: DC
Start: 2021-03-13 — End: 2021-03-14

## 2021-03-13 MED ORDER — POTASSIUM CHLORIDE 10 MEQ/100ML IV SOLN
10.0000 meq | INTRAVENOUS | Status: DC | PRN
Start: 2021-03-13 — End: 2021-03-14

## 2021-03-13 MED ORDER — POTASSIUM & SODIUM PHOSPHATES 280-160-250 MG PO PACK
2.0000 | PACK | ORAL | Status: DC | PRN
Start: 2021-03-13 — End: 2021-03-14

## 2021-03-13 MED ORDER — PROCHLORPERAZINE EDISYLATE 10 MG/2ML IJ SOLN
5.0000 mg | Freq: Four times a day (QID) | INTRAMUSCULAR | Status: DC
Start: 2021-03-13 — End: 2021-03-14
  Filled 2021-03-13 (×3): qty 2

## 2021-03-13 MED ORDER — PROMETHAZINE HCL 25 MG RE SUPP
25.0000 mg | Freq: Four times a day (QID) | RECTAL | Status: DC | PRN
Start: 2021-03-13 — End: 2021-03-14
  Filled 2021-03-13: qty 1

## 2021-03-13 MED ORDER — DICYCLOMINE HCL 10 MG/ML IM SOLN
10.0000 mg | Freq: Once | INTRAMUSCULAR | Status: DC
Start: 2021-03-13 — End: 2021-03-14
  Filled 2021-03-13: qty 2

## 2021-03-13 MED ORDER — PROCHLORPERAZINE MALEATE 5 MG PO TABS
5.0000 mg | ORAL_TABLET | Freq: Four times a day (QID) | ORAL | 0 refills | Status: AC | PRN
Start: 2021-03-13 — End: ?

## 2021-03-13 MED ORDER — ONDANSETRON HCL 4 MG/2ML IJ SOLN
4.0000 mg | Freq: Once | INTRAMUSCULAR | Status: AC
Start: 2021-03-13 — End: 2021-03-13
  Administered 2021-03-13: 4 mg via INTRAVENOUS
  Filled 2021-03-13: qty 2

## 2021-03-13 NOTE — Nursing Progress Note (Signed)
Admission Note-  Patient admitted to Medical # [id number] at 1750. Hand off report received from Epic.   Patient oriented to room, bed in lowest position, bed alarm activated, call bell within reach.   Patient informed of visitor policy.  Belongings cell phone, cell phone charger, clothing.     Brief Clinical Picture:  Neuro AXO x4  Resp - O2 status: RA  GI : nausea, cramping  Family at bedside     Skin Assessment  Two nurse skin assessment performed with: Okey Dupre, RN       Braden score <15. No    Called ED RN to verify bentyl, zofran and LR outstanding orders from prior to arrival to floor. ED RN not available at time - was told to call back in 30 minutes. Will try to verify again.

## 2021-03-13 NOTE — Progress Notes (Signed)
Called [facility] on call for IVF as admission note indicated. Blood sugar in ED was  66. Rechecked sugar beginning of this shift was 103. Awaiting for order at this time.

## 2021-03-13 NOTE — ED Notes (Signed)
[facility] EMERGENCY DEPARTMENT  ED NURSING NOTE FOR THE RECEIVING INPATIENT NURSE   ED NURSE Kristine Royal 1610   ED CHARGE RN Caryn Bee   ADMISSION INFORMATION   Bridget [name] is a 25 y.o. female admitted with an ED diagnosis of:    1. Intractable vomiting    2. Hypoglycemia    3. Metabolic acidosis    4. Dehydration    5. Palpitations    6. Gastroparesis         Isolation: None   Allergies: Haloperidol, Latex, Reglan [metoclopramide], and Morphine   Holding Orders confirmed? Yes   Belongings Documented? Yes   Home medications sent to pharmacy confirmed? N/A   NURSING CARE   Patient Comes From:   Mental Status: Home Independent  alert and oriented   ADL: Needs assistance with ADLs   Ambulation: mild difficulty and 1 person assist   Pertinent Information  and Safety Concerns: N/A     CT / NIH   CT Head ordered on this patient?  N/A   NIH/Dysphagia assessment done prior to admission? N/A   VITAL SIGNS (at the time of this note)      Vitals:    03/13/21 1620   BP: 139/84   Pulse: 93   Resp: 20   Temp: 98.7 F (37.1 C)   SpO2: 100%

## 2021-03-13 NOTE — H&P (Signed)
[facility] Practice Admission H&P  Physician Available 24 hours a day:  Please contact team using the phone number in the sticky note, or Epic chat the residents.  Office#: [phone]       Date Time: 03/13/21 6:30 PM  Patient Name: Bridget [name]  Attending Physician: Angelina Sheriff, MD  Primary Care Physician: Narda Amber, FNP    CC: Intractable vomiting       Assessment:     Active Hospital Problems    Diagnosis    Intractable vomiting       25 y.o. female with known history of asthma, Ehlers-Danlos, POTS, cyclic vomiting syndrome and possible gastroparesis, admitted with dehydration 2/2 intractable vomiting.     Plan:   #Intractable nausea/vomiting  Hx of multiple ED visits and past admission for this issue. Following by GI. Working ddx: gastroparesis iso medication noncompliance vs marijuana overuse     - Continue amitriptyline 50mg  QHS per GI recs  - IV protonix 40 mg BID given intractable vomiting    - Compazine 5mg  IV or PO Q6h scheduled   - Zofran 4mg  IV or PO Q6h scheduled, alternated with Compazine   - Phenergan 25mg  rectally Q6h PRN    - Carafate 4 times daily PRN with meals     - s/p NS IVF in ED,   - Avoid Reglan and Haldol as pt reports adverse reactions to these in the past    #POTS  - Stable, asymptomatic  - Pt followed by cardiology Dr. Cleotis Lema  - Continue home Pyridostigmine 60 mg BID   - Continue to monitor BP and HR    #Dysmennorhea  - Can consider Bentyl injection 10 mg or Toradol 15 mg     #FEN/GI - 125 cc/hr, Clear Liquid Diet with instructions to advance as tolerated     #PPX  - DVT - Lovenox   - GI - (see protonix above)     CODE STATUS: Full    DISPO:  Based on the information available on the day of this admission, patient will be admitted to service status:  Observation:patient is stable and improving. due to intractable vomiting   Anticipated discharge needs: tolerating PO medications     History of Presenting Illness:   Bridget [name] is a 25 y.o.  female with known history of asthma, Ehlers-Danlos, POTS, cyclic vomiting syndrome and possible gastroparesis, presented intractable nausea/vomiting    Avya forgot to take Sunday dose of Nortriptyline and Protonix prior to going on a long car ride. During this time she also did not have any food to eat or her phenergan with her, so was unable to stop her nausea. She started having worsening n/v since then.     Received Phenergan suppository on Tuesday but did not help. Went to ED in [place] and 2L of fluids on Wednesday. Received IV Protonix and felt mildly better and was able to tolerate food (rice and broth) that day.    Flew back last night from [place] - flight was delayed and took her phenergan late, and fell behind on her medications again.     N/v/dysmenorrhea and has not been able to take her OCP since Saturday, so now has abdominal cramping similar to her period pains.     Currently continuing Marijuana nightly at the same dose she has. Had tried dranabinol but it did not help.     ED course:  - Witnessed palpitation events where cardiac monitor reading normal sinus rhythm with  no PVCs or ectopic beats   - S/p Phenergan, zofran, protonix with continued nausea/vomiting   - 1L NS.   - CBC with hemoconcentration. BMP with AG acidosis     SPECIALISTS:  Dr. Hayden Rasmussen  - [facility]  Dr. Trula Slade - [facility]  Dr. Cleotis Lema - Cardiology, saw 2 weeks ago and felt that her POTS was not well controlled Increased Mestinone         Past Medical History:     Past Medical History:   Diagnosis Date    Abdominal pain     Asthma     mild seasonal controlled    Complication of anesthesia     Cyclical vomiting     Ehlers-Danlos disease     Gastroparesis     Post-operative nausea and vomiting     POTS (postural orthostatic tachycardia syndrome)     [facility]/stable for many yrs    Sinus trouble     seasonal    UTI (urinary tract infection)     Yeast infection involving the vagina  and surrounding area 05/09/2015       Past Surgical History:     Past Surgical History:   Procedure Laterality Date    ADENOIDECTOMY      EGD N/A 01/23/2018    Procedure: EGD;  Surgeon: Jacqulyn Cane, MD;  Location: [facility];  Service: Gastroenterology;  Laterality: N/A;  EGD  Q1=N/A    TONSILLECTOMY      WISDOM TOOTH EXTRACTION         Family History:     Family History   Problem Relation Age of Onset    Cancer Mother     Ehlers-Danlos syndrome Sister     Asthma Brother        Social History:     Social History     Socioeconomic History    Marital status: Single     Spouse name: Not on file    Number of children: Not on file    Years of education: Not on file    Highest education level: Not on file   Occupational History    Not on file   Tobacco Use    Smoking status: Never    Smokeless tobacco: Never    Tobacco comments:     the patient vapes nicotine    Vaping Use    Vaping Use: Some days    Start date: 04/14/2018   Substance and Sexual Activity    Alcohol use: Not Currently    Drug use: Yes     Types: Marijuana     Comment: Pt. uses marijuana every day.    Sexual activity: Yes     Partners: Male     Comment: takes the Pill continuously   Other Topics Concern    Poor school performance No    International travel in past 12 mos No   Social History Narrative    Not on file     Social Determinants of Health     Financial Resource Strain: Low Risk     Difficulty of Paying Living Expenses: Not hard at all   Food Insecurity: No Food Insecurity    Worried About Programme researcher, broadcasting/film/video in the Last Year: Never true    Barista in the Last Year: Never true   Transportation Needs: No Transportation Needs    Lack of Transportation (Medical): No    Lack of Transportation (Non-Medical): No  Physical Activity: Not on file   Stress: Not on file   Social Connections: Not on file   Intimate Partner Violence: Not on file   Housing Stability: Not on file       Allergies:     Allergies   Allergen Reactions     Amitriptyline Hives     Patient stated that she received a dose at the hospital and a rash developed all over her face after she arrived home. Ok with nortriptyline.    Haloperidol      Interacts with reglan and also pt had involuntary eye movements.     Latex Itching     Itching irritation swelling condoms  Itching irritation swelling condoms  Itching irritation swelling condoms    Reglan [Metoclopramide]     Morphine Rash       Medications:     Current Discharge Medication List        CONTINUE these medications which have NOT CHANGED    Details   calcium carbonate (TUMS) 500 MG chewable tablet Chew 1 tablet by mouth daily as needed for Heartburn      norethindrone-ethinyl estradiol (ORTHO-NOVUM 1/35) 1-35 MG-MCG per tablet Take 1 tablet by mouth daily  Qty: 28 tablet, Refills: 0    Associated Diagnoses: Encounter for surveillance of contraceptive pills      nortriptyline (PAMELOR) 50 MG capsule Take 50 mg by mouth nightly      ondansetron (ZOFRAN-ODT) 4 MG disintegrating tablet Take 1 tablet (4 mg total) by mouth every 6 (six) hours as needed for Nausea  Qty: 12 tablet, Refills: 0      pantoprazole (PROTONIX) 40 MG tablet Take 1 tablet (40 mg total) by mouth 2 (two) times daily  Qty: 60 tablet, Refills: 0      promethazine (PHENERGAN) 25 MG suppository Place 1 suppository (25 mg total) rectally every 6 (six) hours as needed for Nausea  Qty: 12 suppository, Refills: 1      pyridostigmine (MESTINON) 60 MG tablet Take 60 mg by mouth 2 (two) times daily              Review of Systems:   All other systems were reviewed and are negative except: per HPI    Physical Exam:     VITAL SIGNS: Physical Exam:   Temp:  [98.1 F (36.7 C)-98.7 F (37.1 C)] 98.4 F (36.9 C)  Heart Rate:  [84-95] 89  Resp Rate:  [15-20] 16  BP: (115-157)/(77-97) 115/77  Body mass index is 18.54 kg/m.  No intake or output data in the 24 hours ending 03/13/21 1830 GEN: alert and oriented x 3; NAD  HEENT: sclera anicteric   CARDS: RRR, no murmurs,  rubs or gallops  LUNGS: clear to auscultation bilaterally, without wheezing, rhonchi, or rales  ABD: soft, mild diffuse tenderness worsened with palpation (has intermittent cramping currently)   EXT: no lower extremity edema  NEURO: cranial nerves grossly intact, strength 5/5 in upper and lower extremities, sensation intact  SKIN: Right upper arm rash - patch of erythema spanning lateral aspect of upper arm. Mildly warmer than left upper arm. No vesicles, no papules, coalesced patch of erythema.            Labs Reviewed:     Results       Procedure Component Value Units Date/Time    TSH [[id number]] Collected: 03/13/21 1254    Specimen: Blood Updated: 03/13/21 1701     TSH 0.75 uIU/mL     Urinalysis  Reflex to Microscopic Exam- Reflex to Culture [[id number]]  (Abnormal) Collected: 03/13/21 1439     Updated: 03/13/21 1508     Urine Type Urine, Clean Ca     Color, UA Yellow     Clarity, UA Clear     Specific Gravity UA 1.027     Urine pH 6.0     Leukocyte Esterase, UA Negative     Nitrite, UA Negative     Protein, UR 30     Glucose, UA Negative     Ketones UA 80     Urobilinogen, UA Normal mg/dL      Bilirubin, UA Negative     Blood, UA Small     RBC, UA 0 - 2 /hpf      WBC, UA 0 - 5 /hpf      Squamous Epithelial Cells, Urine 0 - 5 /hpf      Urine Mucus Present    Troponin I [[id number]] Collected: 03/13/21 1254    Specimen: Blood Updated: 03/13/21 1355     Troponin I <0.01 ng/mL     Lipase [[id number]] Collected: 03/13/21 1254    Specimen: Blood Updated: 03/13/21 1349     Lipase 31 U/L     GFR [[id number]] Collected: 03/13/21 1254     Updated: 03/13/21 1349     EGFR >60.0       Basic Metabolic Panel [[id number]]  (Abnormal) Collected: 03/13/21 1254    Specimen: Blood Updated: 03/13/21 1349     Glucose 66 mg/dL      BUN 11 mg/dL      Creatinine 0.8 mg/dL      Calcium 47.4 mg/dL      Sodium 259 mEq/L      Potassium 4.1 mEq/L      Chloride 96 mEq/L      CO2 15 mEq/L      Anion Gap 25.0    Hepatic function panel (LFT)  [[id number]]  (Abnormal) Collected: 03/13/21 1254    Specimen: Blood Updated: 03/13/21 1349     Bilirubin, Total 1.3 mg/dL      Bilirubin Direct 0.4 mg/dL      Bilirubin Indirect 0.9 mg/dL      AST (SGOT) 19 U/L      ALT 16 U/L      Alkaline Phosphatase 79 U/L      Protein, Total 8.9 g/dL      Albumin 5.4 g/dL      Globulin 3.5 g/dL      Albumin/Globulin Ratio 1.5    Beta HCG, Qual, Serum [[id number]] Collected: 03/13/21 1254    Specimen: Blood Updated: 03/13/21 1323     Hcg Qualitative Negative    CBC and differential [[id number]]  (Abnormal) Collected: 03/13/21 1254    Specimen: Blood Updated: 03/13/21 1304     WBC 8.51 x10 3/uL      Hgb 16.9 g/dL      Hematocrit 10.9 %      Platelets 371 x10 3/uL      RBC 5.51 x10 6/uL      MCV 89.7 fL      MCH 30.7 pg      MCHC 34.2 g/dL      RDW 13 %      MPV 9.5 fL      Neutrophils 65.9 %      Lymphocytes Automated 26.2 %      Monocytes 6.5 %      Eosinophils Automated 0.1 %      Basophils Automated  0.9 %      Immature Granulocytes 0.4 %      Nucleated RBC 0.0 /100 WBC      Neutrophils Absolute 5.61 x10 3/uL      Lymphocytes Absolute Automated 2.23 x10 3/uL      Monocytes Absolute Automated 0.55 x10 3/uL      Eosinophils Absolute Automated 0.01 x10 3/uL      Basophils Absolute Automated 0.08 x10 3/uL      Immature Granulocytes Absolute 0.03 x10 3/uL      Absolute NRBC 0.00 x10 3/uL               Imaging Reviewed:     No results found.      Signed by: Alphonzo Dublin, MD  Office #: [phone]     [id number], Octaviano Batty, FNP

## 2021-03-13 NOTE — ED Triage Notes (Signed)
pOTS patient here with dizziness and palpitations

## 2021-03-13 NOTE — Final Progress Note (DC Note for stay less than 48 (Signed)
[facility] Final Progress Note  Physician Available 24 hours a day:  Please call number in sticky note to reach hospital team.  Office#: [phone]     Date Time: 03/13/21 10:06 PM  Patient Name: Bridget [name]  Attending Physician: Angelina Sheriff, MD  Primary Care Physician: Narda Amber, FNP    CC: Intractable vomiting    Assessment:   Principal Problem:    Intractable vomiting  Resolved Problems:    * No resolved hospital problems. *      25 y.o. female PMHx significant forasthma, Ehlers-Danlos, POTS, cyclic vomiting syndrome and possible gastroparesis admitted with dehydration 2/2 intractable vomiting. Patients symptoms improved after IV hydration and antiemetics. Patient is stable for discharge to resume home medications.    Plan:     #Intractable nausea/vomiting - resolved  Hx of multiple ED visits and past admission for this issue. Following by GI. Working ddx: gastroparesis iso medication noncompliance vs marijuana overuse   - Continue home amitriptyline 50mg  QHS, protonix 40 mg BID  - Home PRN: Compazine 5mg  Q6h, Zofran 4mg  Q6h, Phenergan 25mg  rectally Q6h  - Recommended to schedule Zofran for one day  - Advance diet slowly     #POTS - Stable, asymptomatic  Pt followed by cardiology Dr. Cleotis Lema  - Continue home Pyridostigmine 60 mg BID      #Dysmennorhea - mild  - Supportive care      Subjective:     Interval History: Patient reports feeling much improved. She was able to tolerate multiple cans of ginger ale. She would like to be discharged home tonight. She has enough of her medication at home. Patient's mother is accompanying her and will continue to monitor her at home. Return precautions explained to patient.      Physical Exam:     VITAL SIGNS: PHYSICAL EXAM:   Temp:  [98.1 F (36.7 C)-98.7 F (37.1 C)] 98.1 F (36.7 C)  Heart Rate:  [84-95] 88  Resp Rate:  [15-20] 18  BP: (115-157)/(76-97) 118/76    No intake or output data in the 24 hours ending 03/13/21 2206 Per Dr.  Mellody Life H&P  GEN: alert and oriented x 3; NAD  HEENT: sclera anicteric   CARDS: RRR, no murmurs, rubs or gallops  LUNGS: clear to auscultation bilaterally, without wheezing, rhonchi, or rales  ABD: soft, mild diffuse tenderness worsened with palpation (has intermittent cramping currently)   EXT: no lower extremity edema  NEURO: cranial nerves grossly intact, strength 5/5 in upper and lower extremities, sensation intact  SKIN: Right upper arm rash - patch of erythema spanning lateral aspect of upper arm. Mildly warmer than left upper arm. No vesicles, no papules, coalesced patch of erythema.          Current Meds:     Current Facility-Administered Medications   Medication Dose Route Frequency    dicyclomine  10 mg Intramuscular Once    enoxaparin  40 mg Subcutaneous Daily    lactated ringers  1,000 mL Intravenous Once    nortriptyline  50 mg Oral QHS    ondansetron  4 mg Intravenous 4 times per day    pantoprazole  40 mg Intravenous BID    prochlorperazine  5 mg Intravenous Q6H    pyridostigmine  60 mg Oral BID     Current Facility-Administered Medications   Medication Dose Route Frequency Last Rate    sodium chloride   Intravenous Continuous 125 mL/hr at 03/13/21 2023     Current Facility-Administered  Medications   Medication Dose Route    glucagon (rDNA)  1 mg Intramuscular    And    dextrose  250 mL Intravenous    And    dextrose  25 g Intravenous    And    dextrose  25 g Intravenous    magnesium sulfate  1 g Intravenous    melatonin  3 mg Oral    naloxone  0.2 mg Intravenous    ondansetron  4 mg Intravenous    potassium & sodium phosphates  2 packet Oral    potassium chloride  0-40 mEq Oral    And    potassium chloride  10 mEq Intravenous    prochlorperazine  5 mg Oral    promethazine (PHENERGAN) IVPB  12.5 mg Intravenous    promethazine  25 mg Rectal         Labs:     Labs (last 24 hours):  Results       Procedure Component Value Units Date/Time    Glucose Whole Blood - POCT [[id number]]  (Abnormal) Collected:  03/13/21 2204     Updated: 03/13/21 2205     Whole Blood Glucose POCT 111 mg/dL     Glucose Whole Blood - POCT [[id number]]  (Abnormal) Collected: 03/13/21 1948     Updated: 03/13/21 1950     Whole Blood Glucose POCT 103 mg/dL     TSH [[id number]] Collected: 03/13/21 1254    Specimen: Blood Updated: 03/13/21 1701     TSH 0.75 uIU/mL     Urinalysis Reflex to Microscopic Exam- Reflex to Culture [[id number]]  (Abnormal) Collected: 03/13/21 1439     Updated: 03/13/21 1508     Urine Type Urine, Clean Ca     Color, UA Yellow     Clarity, UA Clear     Specific Gravity UA 1.027     Urine pH 6.0     Leukocyte Esterase, UA Negative     Nitrite, UA Negative     Protein, UR 30     Glucose, UA Negative     Ketones UA 80     Urobilinogen, UA Normal mg/dL      Bilirubin, UA Negative     Blood, UA Small     RBC, UA 0 - 2 /hpf      WBC, UA 0 - 5 /hpf      Squamous Epithelial Cells, Urine 0 - 5 /hpf      Urine Mucus Present    Troponin I [[id number]] Collected: 03/13/21 1254    Specimen: Blood Updated: 03/13/21 1355     Troponin I <0.01 ng/mL     Lipase [[id number]] Collected: 03/13/21 1254    Specimen: Blood Updated: 03/13/21 1349     Lipase 31 U/L     GFR [[id number]] Collected: 03/13/21 1254     Updated: 03/13/21 1349     EGFR >60.0       Basic Metabolic Panel [[id number]]  (Abnormal) Collected: 03/13/21 1254    Specimen: Blood Updated: 03/13/21 1349     Glucose 66 mg/dL      BUN 11 mg/dL      Creatinine 0.8 mg/dL      Calcium 75.6 mg/dL      Sodium 433 mEq/L      Potassium 4.1 mEq/L      Chloride 96 mEq/L      CO2 15 mEq/L      Anion Gap 25.0    Hepatic function panel (LFT) [[id number]]  (Abnormal)  Collected: 03/13/21 1254    Specimen: Blood Updated: 03/13/21 1349     Bilirubin, Total 1.3 mg/dL      Bilirubin Direct 0.4 mg/dL      Bilirubin Indirect 0.9 mg/dL      AST (SGOT) 19 U/L      ALT 16 U/L      Alkaline Phosphatase 79 U/L      Protein, Total 8.9 g/dL      Albumin 5.4 g/dL      Globulin 3.5 g/dL      Albumin/Globulin Ratio 1.5     Beta HCG, Qual, Serum [[id number]] Collected: 03/13/21 1254    Specimen: Blood Updated: 03/13/21 1323     Hcg Qualitative Negative    CBC and differential [[id number]]  (Abnormal) Collected: 03/13/21 1254    Specimen: Blood Updated: 03/13/21 1304     WBC 8.51 x10 3/uL      Hgb 16.9 g/dL      Hematocrit 30.8 %      Platelets 371 x10 3/uL      RBC 5.51 x10 6/uL      MCV 89.7 fL      MCH 30.7 pg      MCHC 34.2 g/dL      RDW 13 %      MPV 9.5 fL      Neutrophils 65.9 %      Lymphocytes Automated 26.2 %      Monocytes 6.5 %      Eosinophils Automated 0.1 %      Basophils Automated 0.9 %      Immature Granulocytes 0.4 %      Nucleated RBC 0.0 /100 WBC      Neutrophils Absolute 5.61 x10 3/uL      Lymphocytes Absolute Automated 2.23 x10 3/uL      Monocytes Absolute Automated 0.55 x10 3/uL      Eosinophils Absolute Automated 0.01 x10 3/uL      Basophils Absolute Automated 0.08 x10 3/uL      Immature Granulocytes Absolute 0.03 x10 3/uL      Absolute NRBC 0.00 x10 3/uL             Imaging (last 24 hours), reviewed and are significant for:  No results found.    Signed by: Carney Bern, MD   Office#: [phone]     BM:[id number], Octaviano Batty, FNP

## 2021-03-13 NOTE — Pharmacy Admission Med History Advanced (Signed)
Medication Reconciliation for Admission    Patient's Pharmacy Information:    Primary Pharmacy Updated in Epic: Yes  Preferred pharmacy:   [facility] - [place], [place] - [address]  [address] [place] [address]  Phone: [phone] Fax: [phone]    [id number] - [place], [place] - [address] AT [address]SE[address] OF [place]  [address]  [place] [address]  Phone: [phone] Fax: [phone]        Primary Source of Medication History: Mother and Self    Patient demonstrates Good compliance with medications.  Patient demonstrates Good knowledge of medications.     Medication reconciliation by physician has occurred already.    Prior to Admission Medication List:  Home Medications       Med List Status: Pharmacy Completed Set By: Heloise Ochoa, RPH at 03/13/2021  5:18 PM              calcium carbonate (TUMS) 500 MG chewable tablet     Chew 1 tablet by mouth daily as needed for Heartburn     norethindrone-ethinyl estradiol (ORTHO-NOVUM 1/35) 1-35 MG-MCG per tablet     Take 1 tablet by mouth daily     nortriptyline (PAMELOR) 50 MG capsule     Take 50 mg by mouth nightly     ondansetron (ZOFRAN-ODT) 4 MG disintegrating tablet     Take 1 tablet (4 mg total) by mouth every 6 (six) hours as needed for Nausea     pantoprazole (PROTONIX) 40 MG tablet     Take 1 tablet (40 mg total) by mouth 2 (two) times daily     Patient taking differently: Take 40 mg by mouth as needed     promethazine (PHENERGAN) 25 MG suppository     Place 1 suppository (25 mg total) rectally every 6 (six) hours as needed for Nausea     pyridostigmine (MESTINON) 60 MG tablet     Take 60 mg by mouth 2 (two) times daily     Notes:  Once in AM, once in PM.             Notes:                      An[facility] [place], [facility]

## 2021-03-13 NOTE — EDIE (Signed)
COLLECTIVE?NOTIFICATION?03/13/2021 11:45?Bridget [name], Bridget [name]?MRN: [id number]    Criteria Met      5 ED Visits in 12 Months    Security and Safety  No Security Events were found.  ED Care Guidelines  There are currently no ED Care Guidelines for this patient. Please check your facility's medical records system.        Prescription Monitoring Program  000??- Narcotic Use Score  000??- Sedative Use Score  000??- Stimulant Use Score  000??- Overdose Risk Score  - All Scores range from 000-999 with 75% of the population scoring < 200 and on 1% scoring above 650  - The last digit of the narcotic, sedative, and stimulant score indicates the number of active prescriptions of that type  - Higher Use scores correlate with increased prescribers, pharmacies, mg equiv, and overlapping prescriptions  - Higher Overdose Risk Scores correlate with increased risk of unintentional overdose death   Concerning or unexpectedly high scores should prompt [name] review of the PMP record; this does not constitute checking PMP for prescribing purposes.    E.D. Visit Count (12 mo.)  Facility Visits   [facility] 1   [place] [facility] 8   [facility] [place] [facility]) 1   [facility] 1   Total 11   Note: Visits indicate total known visits.     Recent Emergency Department Visit Summary  Showing 10 most recent visits out of 11 in the past 12 months   Date Facility [facility] Type Diagnoses or Chief Complaint    Mar 13, 2021  [facility] [place] [facility]  [place]  [place]  Emergency      palpitations; Dizzy;      Mar 11, 2021  [place]  [place]  Emergency  Chief Complaint: VOMITING    Aug 04, 2020  [facility] [place] [facility]  [place]  [place]  Emergency      Emesis      Right upper quadrant pain      Dehydration      Tachycardia, unspecified      Aug 01, 2020  [place] [facility] [place] [facility]  [place]  [place]  Emergency      Emesis      Nausea with vomiting, unspecified      Cyclical vomiting syndrome unrelated to migraine      Jul 31, 2020  [place] [facility] [place] [facility]   [place]  [place]  Emergency      Emesis; Nausea      Emesis      Nausea with vomiting, unspecified      Jul 30, 2020  [facility] [place] [facility]  [place]  [place]  Emergency      Nausea; Vomitting      Emesis      Cyclical vomiting syndrome unrelated to migraine      Dehydration      Apr 06, 2020  [facility] [place] [facility]  [place]  [place]  Emergency      N/V;abd pain      Abdominal Pain      Dehydration      Cyclical vomiting syndrome unrelated to migraine      Apr 04, 2020   Emergency Room: [facility])  [place].  [place]  Emergency      weakness; not able to keep food or liquid down      Emesis      Abdominal Pain      Acidosis      Other chronic  pain      Elevated blood-pressure reading, without diagnosis of hypertension      Cyclical vomiting syndrome unrelated to migraine      Generalized abdominal pain      Nausea with vomiting, unspecified      Other disorders of electrolyte and fluid balance, not elsewhere classified      Apr 02, 2020  [facility] [place] [facility]  [place]  [place]  Emergency      ABD PAIN- emesis      Abdominal Pain      Cyclical vomiting syndrome unrelated to migraine      Apr 01, 2020  [facility] [place] [facility]  [place]  [place]  Emergency      Emesis      Nausea      Cyclical vomiting syndrome unrelated to migraine        Recent Inpatient Visit Summary  No Recent Inpatient Visits were found.  Care Team  Provider Specialty Phone Fax Service Dates   Wynne Dust, MD Pediatrics [phone] Current      Collective Portal  This patient has registered at the [facility] Emergency Department   For more information visit: [email or web address]     PLEASE NOTE:     1.   Any care recommendations and other clinical information are provided as guidelines or for historical purposes only, and providers should exercise their own clinical judgment when providing care.    2.   You may only use this information for purposes of treatment, payment or  health care operations activities, and subject to the limitations of applicable Collective Policies.    3.   You should consult directly with the organization that provided [name] care guideline or other clinical history with any questions about additional information or accuracy or completeness of information provided.    ? 2022 [place], [employer] - [email or web address]

## 2021-03-13 NOTE — Discharge Instr - AVS First Page (Addendum)
[facility] Inpatient Service  Discharge Instructions for  Bridget [name]    Date Printed: 03/13/21  Primary Doctor's Name: Narda Amber, FNP, Fax: [phone] Phone: [phone]    Follow up time frame: 3-5 days    Subspecialists: Gastroenterology, Follow up 1 week    Please bring this plan and all your medications to your follow up appointments and any future hospital visits    Diagnosis: Intractable nausea and vomiting    Medications:   Please take your medication as instructed in the AVS.  Changes to home medications: None  New medications prescribed: None    Special Instructions:   Recommend scheduling your zofran 4mg  every 6 hours for the next day rather than using only as needed.  Slowly progress your diet from clear liquids to full liquids to bland foods as you tolerate. Focus on hydration.    [facility]  [phone]  [address]  [place], [place] [address]

## 2021-03-13 NOTE — Progress Notes (Signed)
Pt reported her overall symptoms a lot better now, was able to finish 3 cans of ginger ale, had BM. Pt asked to be discharged.  Pt was recommended to stay overnight per [facility] but adamant about it. [facility] ok to [place] pt home now.

## 2021-03-13 NOTE — Nursing Progress Note (Signed)
Verified with ED RN at this time that ordered dose of Bentyl was not given in the ED and MD wants dose given. Notified night RN.     ED RN verified that LR bolus and ordered dose of zofran was given.

## 2021-03-13 NOTE — ED Provider Notes (Signed)
[facility]       [place] [facility]  EMERGENCY DEPARTMENT HISTORY AND PHYSICAL EXAM    Patient Information     Patient Name: Bridget [name], Bridget [name]  Encounter Date:  03/13/2021  Patient DOB:  12-29-1995  MRN:  [id number]  Room:  29/SS 29  Rendering Provider: Delice Bison A. Jeannetta Nap, PA-C, CAQ-EM    History of Presenting Illness     Chief Complaint: palpitations, nausea, vomiting  Historian: pt  Onset: gradual, Monday  Quality: intermittent, non-bloody  Duration: x4 days      HPI Comments:   25 y.o. female h/o POTS, asthma, Ehlers-Danlos, gastroparesis c/o gradual onset of intermittent nausea and vomiting x4 days.  Patient states on Monday she traveled to [place].  She "immediately" started not feeling well upon arrival.  Since that time she has had intermittent nausea and vomiting to the point where she "can't keep anything down".  Patient has a history of gastroparesis and states that her symptoms feel similar to prior.  She normally would take Phenergan suppository, but did not have this with her when she was traveling.  Patient returned home last night.  She states that she has been trying Phenergan, but it is not helping at this point.  She also cannot take her normal medication regimen including Protonix, nortriptyline, and Mestinon because she cannot keep down her medications.  Patient states beginning yesterday or possibly the day before she started having palpitations described as feeling like occasional "flutters" in her chest.  Patient states that she feels like her palpitations are a result of "dehydration".  She has not had any fever, chest pain, hemoptysis, hematemesis, diarrhea, or dysuria.  No concern for pregnancy or STDs.  No other complaints at this time.  Patient is followed by a gastroenterologist at [facility].  She is scheduled for an endoscopy in approximately two weeks.  Of note, patient does smoke marijuana.      PMD: Narda Amber, FNP  GI: Dr. Marlyne Beards  [facility])    Past Medical History     Past Medical History:   Diagnosis Date    Abdominal pain     Asthma     mild seasonal controlled    Complication of anesthesia     Cyclical vomiting     Ehlers-Danlos disease     Gastroparesis     Post-operative nausea and vomiting     POTS (postural orthostatic tachycardia syndrome)     [facility]/stable for many yrs    Sinus trouble     seasonal    UTI (urinary tract infection)     Yeast infection involving the vagina and surrounding area 05/09/2015       Past Surgical History     Past Surgical History:   Procedure Laterality Date    ADENOIDECTOMY      EGD N/A 01/23/2018    Procedure: EGD;  Surgeon: Jacqulyn Cane, MD;  [facility]: [facility];  Service: Gastroenterology;  Laterality: N/A;  EGD  Q1=N/A    TONSILLECTOMY      WISDOM TOOTH EXTRACTION         Family History     Family History   Problem Relation Age of Onset    Cancer Mother     Ehlers-Danlos syndrome Sister     Asthma Brother        Social History     Social History     Socioeconomic History    Marital status: Single  Spouse name: Not on file    Number of children: Not on file    Years of education: Not on file    Highest education level: Not on file   Occupational History    Not on file   Tobacco Use    Smoking status: Never    Smokeless tobacco: Never    Tobacco comments:     the patient vapes nicotine    Vaping Use    Vaping Use: Some days    Start date: 04/14/2018   Substance and Sexual Activity    Alcohol use: Not Currently    Drug use: Yes     Types: Marijuana     Comment: Pt. uses marijuana every day.    Sexual activity: Yes     Partners: Male     Comment: takes the Pill continuously   Other Topics Concern    Poor school performance No    International travel in past 12 mos No   Social History Narrative    Not on file     Social Determinants of Health     Financial Resource Strain: Low Risk     Difficulty of Paying Living Expenses: Not hard at all   Food Insecurity:  No Food Insecurity    Worried About Programme researcher, broadcasting/film/video in the Last Year: Never true    Barista in the Last Year: Never true   Transportation Needs: No Transportation Needs    Lack of Transportation (Medical): No    Lack of Transportation (Non-Medical): No   Physical Activity: Not on file   Stress: Not on file   Social Connections: Not on file   Intimate Partner Violence: Not on file   Housing Stability: Not on file   Smokes marijuana  Mother at bedside    Allergies     Allergies   Allergen Reactions    Haloperidol      Interacts with reglan and also pt had involuntary eye movements.     Latex Itching     Itching irritation swelling condoms  Itching irritation swelling condoms  Itching irritation swelling condoms    Reglan [Metoclopramide]     Morphine Rash       Home Medications     Prior to Admission medications    Medication Sig Start Date End Date Taking? Authorizing Provider   albuterol sulfate HFA (PROVENTIL) 108 (90 Base) MCG/ACT inhaler Inhale 2 puffs into the lungs every 4 (four) hours as needed for Wheezing or Shortness of Breath 08/18/20 08/18/21  Ledell Peoples, DNP NP   amitriptyline (ELAVIL) 25 MG tablet Take 1 tablet (25 mg total) by mouth nightly 08/06/20   Irving Shows, MD   calcium carbonate (TUMS) 500 MG chewable tablet Chew 1 tablet by mouth daily as needed for Heartburn    [provider]   fluticasone (Flovent HFA) 110 MCG/ACT inhaler Inhale 1 puff into the lungs 2 (two) times daily 08/18/20   Ledell Peoples, DNP NP   norethindrone-ethinyl estradiol (ORTHO-NOVUM 1/35) 1-35 MG-MCG per tablet Take 1 tablet by mouth daily 02/16/21   Narda Amber, FNP   ondansetron (ZOFRAN-ODT) 4 MG disintegrating tablet Take 1 tablet (4 mg total) by mouth every 6 (six) hours as needed for Nausea 04/03/20   Farrel Demark, MD   pantoprazole (PROTONIX) 40 MG tablet Take 1 tablet (40 mg total) by mouth 2 (two) times daily  Patient taking differently: Take 40 mg by mouth as needed  08/06/20    Irving Shows, MD   prochlorperazine (COMPAZINE) 5 MG tablet Take 1 tablet (5 mg total) by mouth every 6 (six) hours as needed for Nausea 04/03/20   Mauger, Abner Greenspan, MD   promethazine (PHENERGAN) 12.5 MG tablet Take 12.5 mg by mouth every 6 (six) hours as needed for Nausea    [provider]   promethazine (PHENERGAN) 25 MG suppository Place 1 suppository (25 mg total) rectally every 6 (six) hours as needed for Nausea 08/02/20   Ferne Coe, DO   pyridostigmine (MESTINON) 60 MG tablet Take 60 mg by mouth 2 (two) times daily    [provider]   sucralfate (CARAFATE) 1 GM/10ML suspension Take 10 mLs (1 g total) by mouth 4 (four) times daily as needed (before meals to help with reflux and nausea) 08/06/20   Irving Shows, MD         Review of Systems     Review of Systems   Constitutional: Negative for fever.   Cardiovascular: Positive for palpitations. Negative for chest pain.   Respiratory: Occasional SOB.  Gastrointestinal: Positive for nausea, vomiting, and abd pain.  No hematemesis or diarrhea.  Genitourinary: No dysuria.   Musculoskeletal: Positive for back pain.    All other systems reviewed and are negative.      Physical Exam     Patient Vitals for the past 24 hrs:   BP Temp Pulse Resp SpO2 Weight   03/13/21 1439 126/89 -- 95 15 100 % --   03/13/21 1405 (!) 157/92 98.1 F (36.7 C) 84 18 100 % --   03/13/21 1200 (!) 140/97 -- 84 15 99 % --   03/13/21 1152 -- -- -- -- -- 49.4 kg     Physical Exam   Nursing note and vitals reviewed.   Constitutional: Thin 25 y/o F. Pt is oriented to person, place, and time.  Looks to not feel well.  HENT:     Head: Normocephalic and atraumatic.     Right Ear: External ear normal.     Left Ear: External ear normal.     Mouth/Throat: Normal speech. (Wearing a mask).  Eyes: Conjunctivae normal. No drainage. EOMI.  Neck: Normal range of motion.   Cardiovascular: Regular rate and rhythm. No murmur.    Pulmonary/Chest: Vesicular breath sounds throughout.  No respiratory distress. O2 sat 99% on RA.   Abdominal: Bowel sounds are normal. Abdomen soft.  Abdomen soft.  Mildly tender to palpation throughout the abdomen, greatest in the epigastric area.  Neurological: Pt is alert and oriented to person, place, and time. GCS 15.    Skin: Skin is warm and dry. Normal skin turgor.   Psychiatric: Normal mood and affect. Behavior is normal.       Orders Placed During This Encounter     Orders Placed This Encounter   Procedures    CBC and differential    Basic Metabolic Panel    Beta HCG, Qual, Serum    Hepatic function panel (LFT)    Lipase    Urinalysis Reflex to Microscopic Exam- Reflex to Culture    TSH    Troponin I    GFR    Orthostatic Vital Signs    Cardiac monitoring (ED Only)    Diet:  PO Challenge    ED Unit Sec Comm Order    ECG 12 lead    Saline lock IV    Adult Admit to Observation  ED Medications Administered     ED Medication Orders (From admission, onward)      Start Ordered     Status Ordering Provider    03/13/21 1405 03/13/21 1404  ondansetron (ZOFRAN) injection 4 mg  Once        Route: Intravenous  Ordered Dose: 4 mg     Last MAR action: Given Deborah Chalk A    03/13/21 1353 03/13/21 1352  lactated ringers bolus 1,000 mL  Once        Route: Intravenous  Ordered Dose: 1,000 mL     Acknowledged Deborah Chalk A    03/13/21 1243 03/13/21 1242  sodium chloride 0.9 % bolus 1,000 mL  Once        Route: Intravenous  Ordered Dose: 1,000 mL     Last MAR action: New Bag Zebediah Beezley A    03/13/21 1243 03/13/21 1242  promethazine (PHENERGAN) 12.5 mg in sodium chloride 0.9 % 50 mL IVPB  Once        Route: Intravenous  Ordered Dose: 12.5 mg     Last MAR action: Given Deborah Chalk A    03/13/21 1243 03/13/21 1242  pantoprazole (PROTONIX) injection 40 mg  Once        Route: Intravenous  Ordered Dose: 40 mg     Last MAR action: Given Damonta Cossey A            Diagnostic Study Results and Data Review     The results of the diagnostic studies below were reviewed by the ED  provider:    Labs  Results       Procedure Component Value Units Date/Time    Urinalysis Reflex to Microscopic Exam- Reflex to Culture [[id number]]  (Abnormal) Collected: 03/13/21 1439     Updated: 03/13/21 1508     Urine Type Urine, Clean Ca     Color, UA Yellow     Clarity, UA Clear     Specific Gravity UA 1.027     Urine pH 6.0     Leukocyte Esterase, UA Negative     Nitrite, UA Negative     Protein, UR 30     Glucose, UA Negative     Ketones UA 80     Urobilinogen, UA Normal mg/dL      Bilirubin, UA Negative     Blood, UA Small     RBC, UA 0 - 2 /hpf      WBC, UA 0 - 5 /hpf      Squamous Epithelial Cells, Urine 0 - 5 /hpf      Urine Mucus Present    Troponin I [[id number]] Collected: 03/13/21 1254    Specimen: Blood Updated: 03/13/21 1355     Troponin I <0.01 ng/mL     Lipase [[id number]] Collected: 03/13/21 1254    Specimen: Blood Updated: 03/13/21 1349     Lipase 31 U/L     GFR [[id number]] Collected: 03/13/21 1254     Updated: 03/13/21 1349     EGFR >60.0       Basic Metabolic Panel [[id number]]  (Abnormal) Collected: 03/13/21 1254    Specimen: Blood Updated: 03/13/21 1349     Glucose 66 mg/dL      BUN 11 mg/dL      Creatinine 0.8 mg/dL      Calcium 38.7 mg/dL      Sodium 564 mEq/L      Potassium 4.1 mEq/L      Chloride 96 mEq/L  CO2 15 mEq/L      Anion Gap 25.0    Hepatic function panel (LFT) [[id number]]  (Abnormal) Collected: 03/13/21 1254    Specimen: Blood Updated: 03/13/21 1349     Bilirubin, Total 1.3 mg/dL      Bilirubin Direct 0.4 mg/dL      Bilirubin Indirect 0.9 mg/dL      AST (SGOT) 19 U/L      ALT 16 U/L      Alkaline Phosphatase 79 U/L      Protein, Total 8.9 g/dL      Albumin 5.4 g/dL      Globulin 3.5 g/dL      Albumin/Globulin Ratio 1.5    Beta HCG, Qual, Serum [[id number]] Collected: 03/13/21 1254    Specimen: Blood Updated: 03/13/21 1323     Hcg Qualitative Negative    CBC and differential [[id number]]  (Abnormal) Collected: 03/13/21 1254    Specimen: Blood Updated: 03/13/21 1304     WBC 8.51  x10 3/uL      Hgb 16.9 g/dL      Hematocrit 08.6 %      Platelets 371 x10 3/uL      RBC 5.51 x10 6/uL      MCV 89.7 fL      MCH 30.7 pg      MCHC 34.2 g/dL      RDW 13 %      MPV 9.5 fL      Neutrophils 65.9 %      Lymphocytes Automated 26.2 %      Monocytes 6.5 %      Eosinophils Automated 0.1 %      Basophils Automated 0.9 %      Immature Granulocytes 0.4 %      Nucleated RBC 0.0 /100 WBC      Neutrophils Absolute 5.61 x10 3/uL      Lymphocytes Absolute Automated 2.23 x10 3/uL      Monocytes Absolute Automated 0.55 x10 3/uL      Eosinophils Absolute Automated 0.01 x10 3/uL      Basophils Absolute Automated 0.08 x10 3/uL      Immature Granulocytes Absolute 0.03 x10 3/uL      Absolute NRBC 0.00 x10 3/uL     TSH [[id number]] Collected: 03/13/21 1254    Specimen: Blood Updated: 03/13/21 1255            Radiologic Studies  Radiology Results (24 Hour)       ** No results found for the last 24 hours. **                Monitors, EKG, Procedures, Critical Care, and Splints     Cardiac Monitor Interpretation: Sinus rhythm at 86 bpm.  No ectopy.  EKG: NSR at 78 bpm. Normal axis. Peaked T waves. Otherwise, no acute ST or T wave changes. EKG not significantly changed when compared to EKG dated 01/30/20. EKG reviewed by MD Quincy Sheehan.    MDM and Clinical Notes     Working Differential (not completely inclusive): gastroparesis, cannabis hyperemesis syndrome, GERD, PUD, biliary colic, cholecystitis, pancreatitis, bowel obstruction, etc    Nursing records reviewed and agree: Yes      Clinical Notes: Pt is a 25 y/o F who presents w/ nausea, vomiting and abd pain since Monday. She's also now having palpitations which she attributes to becoming dehydrated. Unable to tolerate her normal medication regimen due to her N/V. States symptoms similar to prior. Will plan to check basic labs, cardiac  marker and ekg, and UA. Pt in agreement. No indication for imaging at this time.     While in the exam room the patient had an episode of these  "flutters".  Cardiac monitor reading normal sinus rhythm.  No PVCs or other ectopic beats.    CBC unremarkable  HCG neg  Slightly low glucose - provided w/ juice  Cr wnl  Low bicarb with elevated gap - fluids in place  LFTs and lipase normal  Bili 1.3  UA suggestive of dehydration  Trop neg    ED Course as of 03/14/21 0030   Fri Mar 13, 2021   1403 Re-Eval - Pt states nausea starting to return again after attempt at PO trial. Will order additional antiemetics.  [TE]   1423 Re-Eval: Pt now vomiting after zofran.     Re-Eval at 223PM: Pt ambulatory to restroom with mother at her side.  [TE]      ED Course User Index  [TE] Deborah Chalk A, PA       Re-Eval: Re-eval - Pt continues to not feel well despite her normal regimen of IV phenergan, protonix and fluids which typically help with her symptoms. May need to consider admission for intractable vomiting. Will d/w [facility].    Phone Conversation: Spoke w/ Maureen Ralphs [facility] resident) - Case discussed. Labs reviewed. Will see pt in ED for eval.    Pt seen in ED by [facility] team. They accept for admission.    At the time of admission, available clinical data was discussed with the admitting physician.  The admitting physician requests obs status.      Pt/mother updated on plans for admission. They're in agreement.    Personal Protective Equipment:  I was wearing appropriate personal protective equipment at the time of patient evaluation and for all face-to-face interactions:     Pandemic Caveat:  The patient was seen and evaluated during the time of COVID pandemic.  Significant limitations can be present in the evaluation and management of emergency department patients during pandemic conditions, including but not limited to lack of testing, rapidly changing IHS protocols, and/or limited follow-up resources.         Prescriptions       New Prescriptions    No medications on file       Diagnosis and Disposition     Clinical Impression:  1. Intractable vomiting    2.  Hypoglycemia    3. Metabolic acidosis    4. Dehydration    5. Palpitations    6. Gastroparesis      Final diagnoses:   Intractable vomiting   Hypoglycemia   Metabolic acidosis   Dehydration   Palpitations   Gastroparesis       Disposition:  ED Disposition       ED Disposition   Observation    Condition   --    Date/Time   Fri Mar 13, 2021  4:17 PM    Comment   Admitting Physician: Angelina Sheriff [[id number]]   Service:: Medicine [[id number]]   Estimated Length of Stay: < 2 midnights   Tentative Discharge Plan?: Home or Self Care [1]   Does patient need telemetry?: No                       Rendering Provider: Cyndy Freeze. Jeannetta Nap, PA-C, CAQ-EM    Attending's signature signifies review of the provider note and clinical impression.      This note was  generated by the [facility] EMR system/Dragon speech recognition and may contain inherent errors or omissions not intended by the user. Grammatical errors, random word insertions, deletions, pronoun errors and incomplete sentences are occasional consequences of this technology due to software limitations. Not all errors are caught or corrected. If there are questions or concerns about the content of this note or information contained within the body of this dictation they should be addressed directly with the author for clarification.           Nigel Sloop, [place]  03/14/21 [phone]

## 2021-03-13 NOTE — Progress Notes (Signed)
AVS reviewed with pt and mother. PIV removed. Pt was discharged home per order.

## 2021-03-14 LAB — ECG 12-LEAD
Atrial Rate: 78 {beats}/min
IHS MUSE NARRATIVE AND IMPRESSION: NORMAL
P Axis: 67 degrees
P-R Interval: 122 ms
Q-T Interval: 366 ms
QRS Duration: 82 ms
QTC Calculation (Bezet): 417 ms
R Axis: 74 degrees
T Axis: 65 degrees
Ventricular Rate: 78 {beats}/min

## 2021-03-15 ENCOUNTER — Observation Stay
Admission: EM | Admit: 2021-03-15 | Discharge: 2021-03-17 | Discharge status: Against Medical Advice | Payer: No Typology Code available for payment source | Attending: Family Medicine | Admitting: Family Medicine

## 2021-03-15 DIAGNOSIS — J45909 Unspecified asthma, uncomplicated: Secondary | ICD-10-CM | POA: Insufficient documentation

## 2021-03-15 DIAGNOSIS — N809 Endometriosis, unspecified: Secondary | ICD-10-CM | POA: Diagnosis present

## 2021-03-15 DIAGNOSIS — I498 Other specified cardiac arrhythmias: Secondary | ICD-10-CM | POA: Insufficient documentation

## 2021-03-15 DIAGNOSIS — R1110 Vomiting, unspecified: Secondary | ICD-10-CM

## 2021-03-15 DIAGNOSIS — G90A Postural orthostatic tachycardia syndrome (POTS): Secondary | ICD-10-CM | POA: Diagnosis present

## 2021-03-15 DIAGNOSIS — R112 Nausea with vomiting, unspecified: Secondary | ICD-10-CM | POA: Diagnosis present

## 2021-03-15 DIAGNOSIS — N946 Dysmenorrhea, unspecified: Secondary | ICD-10-CM | POA: Insufficient documentation

## 2021-03-15 DIAGNOSIS — Z20822 Contact with and (suspected) exposure to covid-19: Secondary | ICD-10-CM | POA: Insufficient documentation

## 2021-03-15 DIAGNOSIS — Z5189 Encounter for other specified aftercare: Secondary | ICD-10-CM

## 2021-03-15 DIAGNOSIS — R030 Elevated blood-pressure reading, without diagnosis of hypertension: Secondary | ICD-10-CM | POA: Insufficient documentation

## 2021-03-15 DIAGNOSIS — K30 Functional dyspepsia: Secondary | ICD-10-CM | POA: Diagnosis present

## 2021-03-15 DIAGNOSIS — F1290 Cannabis use, unspecified, uncomplicated: Secondary | ICD-10-CM | POA: Insufficient documentation

## 2021-03-15 DIAGNOSIS — R1115 Cyclical vomiting syndrome unrelated to migraine: Principal | ICD-10-CM | POA: Insufficient documentation

## 2021-03-15 LAB — COVID-19 (SARS-COV-2): SARS CoV-2: NEGATIVE

## 2021-03-15 LAB — COMPREHENSIVE METABOLIC PANEL
ALT: 13 U/L (ref 0–55)
AST (SGOT): 15 U/L (ref 5–34)
Albumin/Globulin Ratio: 1.6 (ref 0.9–2.2)
Albumin: 4.9 g/dL (ref 3.5–5.0)
Alkaline Phosphatase: 78 U/L (ref 37–106)
Anion Gap: 20 — ABNORMAL HIGH (ref 5.0–15.0)
BUN: 13 mg/dL (ref 7–19)
Bilirubin, Total: 1.3 mg/dL — ABNORMAL HIGH (ref 0.2–1.2)
CO2: 24 mEq/L (ref 22–29)
Calcium: 10.4 mg/dL (ref 8.5–10.5)
Chloride: 95 mEq/L — ABNORMAL LOW (ref 100–111)
Creatinine: 0.9 mg/dL (ref 0.6–1.0)
Globulin: 3 g/dL (ref 2.0–3.6)
Glucose: 106 mg/dL — ABNORMAL HIGH (ref 70–100)
Potassium: 3.6 mEq/L (ref 3.5–5.1)
Protein, Total: 7.9 g/dL (ref 6.0–8.3)
Sodium: 139 mEq/L (ref 136–145)

## 2021-03-15 LAB — URINALYSIS REFLEX TO MICROSCOPIC EXAM - REFLEX TO CULTURE
Bilirubin, UA: NEGATIVE
Glucose, UA: NEGATIVE
Ketones UA: 80 — AB
Leukocyte Esterase, UA: NEGATIVE
Nitrite, UA: NEGATIVE
Protein, UR: 30 — AB
Specific Gravity UA: 1.018 (ref 1.001–1.035)
Urine pH: 6 (ref 5.0–8.0)
Urobilinogen, UA: NORMAL mg/dL (ref 0.2–2.0)

## 2021-03-15 LAB — GLUCOSE WHOLE BLOOD - POCT: Whole Blood Glucose POCT: 98 mg/dL (ref 70–100)

## 2021-03-15 LAB — CBC AND DIFFERENTIAL
Absolute NRBC: 0 10*3/uL (ref 0.00–0.00)
Basophils Absolute Automated: 0.06 10*3/uL (ref 0.00–0.08)
Basophils Automated: 0.8 %
Eosinophils Absolute Automated: 0.03 10*3/uL (ref 0.00–0.44)
Eosinophils Automated: 0.4 %
Hematocrit: 50 % — ABNORMAL HIGH (ref 34.7–43.7)
Hgb: 17.4 g/dL — ABNORMAL HIGH (ref 11.4–14.8)
Immature Granulocytes Absolute: 0.03 10*3/uL (ref 0.00–0.07)
Immature Granulocytes: 0.4 %
Lymphocytes Absolute Automated: 2.64 10*3/uL (ref 0.42–3.22)
Lymphocytes Automated: 33.2 %
MCH: 30.8 pg (ref 25.1–33.5)
MCHC: 34.8 g/dL (ref 31.5–35.8)
MCV: 88.5 fL (ref 78.0–96.0)
MPV: 9.4 fL (ref 8.9–12.5)
Monocytes Absolute Automated: 0.61 10*3/uL (ref 0.21–0.85)
Monocytes: 7.7 %
Neutrophils Absolute: 4.57 10*3/uL (ref 1.10–6.33)
Neutrophils: 57.5 %
Nucleated RBC: 0 /100 WBC (ref 0.0–0.0)
Platelets: 343 10*3/uL (ref 142–346)
RBC: 5.65 10*6/uL — ABNORMAL HIGH (ref 3.90–5.10)
RDW: 13 % (ref 11–15)
WBC: 7.94 10*3/uL (ref 3.10–9.50)

## 2021-03-15 LAB — GFR: EGFR: >60

## 2021-03-15 LAB — LIPASE: Lipase: 30 U/L (ref 8–78)

## 2021-03-15 LAB — HCG, SERUM, QUALITATIVE: Hcg Qualitative: NEGATIVE

## 2021-03-15 MED ORDER — DEXTROSE 10 % IV BOLUS
25.0000 g | INTRAVENOUS | Status: DC | PRN
Start: 2021-03-15 — End: 2021-03-18

## 2021-03-15 MED ORDER — SODIUM CHLORIDE 0.9 % IV SOLN
12.5000 mg | Freq: Four times a day (QID) | INTRAVENOUS | Status: DC
Start: 2021-03-15 — End: 2021-03-17
  Administered 2021-03-15 – 2021-03-17 (×8): 12.5 mg via INTRAVENOUS
  Filled 2021-03-15 (×8): qty 1

## 2021-03-15 MED ORDER — SODIUM CHLORIDE 0.9 % IV SOLN
500.0000 mg | Freq: Once | INTRAVENOUS | Status: DC
Start: 2021-03-15 — End: 2021-03-15

## 2021-03-15 MED ORDER — DEXTROSE 5% IV BOLUS
250.0000 mL | INTRAVENOUS | Status: DC | PRN
Start: 2021-03-15 — End: 2021-03-18

## 2021-03-15 MED ORDER — PANTOPRAZOLE SODIUM 40 MG IV SOLR
40.0000 mg | Freq: Every day | INTRAVENOUS | Status: DC
Start: 2021-03-16 — End: 2021-03-17
  Administered 2021-03-16 – 2021-03-17 (×2): 40 mg via INTRAVENOUS
  Filled 2021-03-15 (×2): qty 40

## 2021-03-15 MED ORDER — FAMOTIDINE 10 MG/ML IV SOLN (WRAP)
20.0000 mg | Freq: Once | INTRAVENOUS | Status: AC
Start: 2021-03-15 — End: 2021-03-15
  Administered 2021-03-15: 20 mg via INTRAVENOUS
  Filled 2021-03-15: qty 2

## 2021-03-15 MED ORDER — ONDANSETRON HCL 4 MG/2ML IJ SOLN
4.0000 mg | Freq: Once | INTRAMUSCULAR | Status: AC
Start: 2021-03-15 — End: 2021-03-15
  Administered 2021-03-15: 4 mg via INTRAVENOUS
  Filled 2021-03-15: qty 2

## 2021-03-15 MED ORDER — NALOXONE HCL 0.4 MG/ML IJ SOLN (WRAP)
0.2000 mg | INTRAMUSCULAR | Status: DC | PRN
Start: 2021-03-15 — End: 2021-03-18

## 2021-03-15 MED ORDER — LACTATED RINGERS IV SOLN
INTRAVENOUS | Status: DC
Start: 2021-03-15 — End: 2021-03-17

## 2021-03-15 MED ORDER — ENOXAPARIN SODIUM 40 MG/0.4ML IJ SOSY
40.0000 mg | PREFILLED_SYRINGE | Freq: Every day | INTRAMUSCULAR | Status: DC
Start: 2021-03-16 — End: 2021-03-18
  Administered 2021-03-16 – 2021-03-17 (×2): 40 mg via SUBCUTANEOUS
  Filled 2021-03-15 (×2): qty 0.4

## 2021-03-15 MED ORDER — ONDANSETRON HCL 4 MG/2ML IJ SOLN
4.0000 mg | Freq: Four times a day (QID) | INTRAMUSCULAR | Status: DC
Start: 2021-03-15 — End: 2021-03-17
  Administered 2021-03-15 – 2021-03-17 (×8): 4 mg via INTRAVENOUS
  Filled 2021-03-15 (×8): qty 2

## 2021-03-15 MED ORDER — SODIUM CHLORIDE 0.9 % IV SOLN
125.0000 mg | Freq: Once | INTRAVENOUS | Status: AC
Start: 2021-03-15 — End: 2021-03-15
  Administered 2021-03-15: 125 mg via INTRAVENOUS
  Filled 2021-03-15: qty 125

## 2021-03-15 MED ORDER — POTASSIUM CHLORIDE CRYS ER 20 MEQ PO TBCR
0.0000 meq | EXTENDED_RELEASE_TABLET | ORAL | Status: DC | PRN
Start: 2021-03-15 — End: 2021-03-18

## 2021-03-15 MED ORDER — MELATONIN 3 MG PO TABS
3.0000 mg | ORAL_TABLET | Freq: Every evening | ORAL | Status: DC | PRN
Start: 2021-03-15 — End: 2021-03-18

## 2021-03-15 MED ORDER — POTASSIUM & SODIUM PHOSPHATES 280-160-250 MG PO PACK
2.0000 | PACK | ORAL | Status: DC | PRN
Start: 2021-03-15 — End: 2021-03-18

## 2021-03-15 MED ORDER — SODIUM CHLORIDE 0.9 % IV SOLN
125.0000 mg | Freq: Three times a day (TID) | INTRAVENOUS | Status: DC
Start: 2021-03-15 — End: 2021-03-17
  Administered 2021-03-15 – 2021-03-17 (×5): 125 mg via INTRAVENOUS
  Filled 2021-03-15 (×6): qty 125

## 2021-03-15 MED ORDER — POTASSIUM CHLORIDE 10 MEQ/100ML IV SOLN
10.0000 meq | INTRAVENOUS | Status: DC | PRN
Start: 2021-03-15 — End: 2021-03-18

## 2021-03-15 MED ORDER — GLUCAGON 1 MG IJ SOLR (WRAP)
1.0000 mg | INTRAMUSCULAR | Status: DC | PRN
Start: 2021-03-15 — End: 2021-03-18

## 2021-03-15 MED ORDER — DEXTROSE 50 % IV SOLN
25.0000 g | INTRAVENOUS | Status: DC | PRN
Start: 2021-03-15 — End: 2021-03-18

## 2021-03-15 MED ORDER — MAGNESIUM SULFATE IN D5W 1-5 GM/100ML-% IV SOLN
1.0000 g | INTRAVENOUS | Status: DC | PRN
Start: 2021-03-15 — End: 2021-03-18
  Administered 2021-03-17 (×2): 1 g via INTRAVENOUS
  Filled 2021-03-15: qty 100

## 2021-03-15 MED ORDER — PROCHLORPERAZINE EDISYLATE 10 MG/2ML IJ SOLN
5.0000 mg | Freq: Four times a day (QID) | INTRAMUSCULAR | Status: DC | PRN
Start: 2021-03-15 — End: 2021-03-18
  Filled 2021-03-15: qty 2

## 2021-03-15 MED ORDER — PANTOPRAZOLE SODIUM 40 MG IV SOLR
40.0000 mg | Freq: Once | INTRAVENOUS | Status: AC
Start: 2021-03-15 — End: 2021-03-15
  Administered 2021-03-15: 40 mg via INTRAVENOUS
  Filled 2021-03-15: qty 40

## 2021-03-15 MED ORDER — SODIUM CHLORIDE 0.9 % IV BOLUS
1000.0000 mL | Freq: Once | INTRAVENOUS | Status: AC
Start: 2021-03-15 — End: 2021-03-15
  Administered 2021-03-15: 1000 mL via INTRAVENOUS

## 2021-03-15 MED ORDER — SODIUM CHLORIDE 0.9 % IV SOLN
12.5000 mg | Freq: Once | INTRAVENOUS | Status: AC
Start: 2021-03-15 — End: 2021-03-15
  Administered 2021-03-15: 12.5 mg via INTRAVENOUS
  Filled 2021-03-15: qty 1

## 2021-03-15 NOTE — ED Notes (Signed)
Pt resting quietly, stopped crying. Refusing to eat, but c/o hunger. Mom at bedside.

## 2021-03-15 NOTE — ED Triage Notes (Signed)
Abd pain and vomiting onset Monday, seen here on Friday for same.  Pt states too weak to stand for triage wt. Hx gastroparesis

## 2021-03-15 NOTE — ED Notes (Signed)
ED tech to take pt to room with belongings, mom at bedside. Pt continues to refuse to eat. BS checked and 98.

## 2021-03-15 NOTE — ED Notes (Signed)
Pt walked to bathroom with 1 person assist.

## 2021-03-15 NOTE — ED Notes (Signed)
[facility] EMERGENCY DEPARTMENT  ED NURSING NOTE FOR THE RECEIVING INPATIENT NURSE   ED NURSE [place] [phone]   ED CHARGE RN Jae Dire   ADMISSION INFORMATION   Bridget [name] is a 25 y.o. female admitted with an ED diagnosis of:    1. Intractable nausea and vomiting         Isolation: None   Allergies: Amitriptyline, Haloperidol, Latex, Reglan [metoclopramide], and Morphine   Holding Orders confirmed? Yes   Belongings Documented? Yes   Home medications sent to pharmacy confirmed? N/A   NURSING CARE   Patient Comes From:   Mental Status: Home/Family Care  alert and oriented   ADL: Needs assistance with ADLs   Ambulation: 1 person assist   Pertinent Information  and Safety Concerns: Refusing to eat     CT / NIH   CT Head ordered on this patient?  N/A   NIH/Dysphagia assessment done prior to admission? N/A   VITAL SIGNS (at the time of this note)      Vitals:    03/15/21 1730   BP: (!) 137/99   Pulse: 82   Resp: 17   Temp: 98.2 F (36.8 C)   SpO2: 97%

## 2021-03-15 NOTE — ED Provider Notes (Signed)
EMERGENCY DEPARTMENT HISTORY AND PHYSICAL EXAM      Patient Information     Patient Name: Bridget [name], Bridget [name]  Encounter Date:  03/15/2021  Patient DOB:  1996/03/16  MRN:  [id number]  Room:  14/B14  Rendering Provider: Algernon Huxley, PA-C    History of Presenting Illness     Chief Complaint: generalized weakness      HPI Comments:   25 y.o. female h/o POTS, asthma, Ehlers-Danlos, gastroparesis, cyclic vomiting syndrome c/o gradual onset of intermittent nausea and vomiting x 6 days.  She was discharged from this hospital on Friday, 2 days ago, for intractable nausea and vomiting.  Symptoms improved after IV hydration and antiemetics and she was discharged home with prescriptions for Compazine, Zofran, and Phenergan to take as needed.  She returns today for evaluation of inability to tolerate oral intake and generalized weakness.  She is followed by a gastroenterologist at [facility] and is scheduled for an endoscopy in 2 weeks.  She smokes marijuana daily, but has not smoked x 1 week.  She is currently on her menstrual period and has likely endometriosis and is currently experiencing lower abdominal cramping.  She has no history of lower abdominal surgeries.  Also has some pain in her epigastric area.  She took a Phenergan suppository at 7 AM this morning.  Denies nausea at present.  Did vomit upon waking up this morning before taking the Phenergan.  Denies fever, cough, sore throat, chest pain, shortness of breath.  She has not been able to tolerate oral medications and request dose of Protonix. Her last bowel movement was yesterday. Tolerated a fudgsicle and ginger ale yestewrda    PMD: Narda Amber, FNP    Past Medical History     Past Medical History:   Diagnosis Date    Abdominal pain     Asthma     mild seasonal controlled    Complication of anesthesia     Cyclical vomiting     Ehlers-Danlos disease     Gastroparesis     Post-operative nausea and vomiting     POTS (postural orthostatic tachycardia  syndrome)     [facility]/stable for many yrs    Sinus trouble     seasonal    UTI (urinary tract infection)     Yeast infection involving the vagina and surrounding area 05/09/2015       Past Surgical History     Past Surgical History:   Procedure Laterality Date    ADENOIDECTOMY      EGD N/A 01/23/2018    Procedure: EGD;  Surgeon: Jacqulyn Cane, MD;  Location: [facility];  Service: Gastroenterology;  Laterality: N/A;  EGD  Q1=N/A    TONSILLECTOMY      WISDOM TOOTH EXTRACTION         Family History     Family History   Problem Relation Age of Onset    Cancer Mother     Ehlers-Danlos syndrome Sister     Asthma Brother        Social History     Social History     Socioeconomic History    Marital status: Single     Spouse name: Not on file    Number of children: Not on file    Years of education: Not on file    Highest education level: Not on file   Occupational History    Not on file   Tobacco Use    Smoking status:  Never    Smokeless tobacco: Never    Tobacco comments:     the patient vapes nicotine    Vaping Use    Vaping Use: Some days    Start date: 04/14/2018   Substance and Sexual Activity    Alcohol use: Not Currently    Drug use: Yes     Types: Marijuana     Comment: Pt. uses marijuana every day.    Sexual activity: Yes     Partners: Male     Comment: takes the Pill continuously   Other Topics Concern    Poor school performance No    International travel in past 12 mos No   Social History Narrative    Not on file     Social Determinants of Health     Financial Resource Strain: Low Risk     Difficulty of Paying Living Expenses: Not hard at all   Food Insecurity: No Food Insecurity    Worried About Programme researcher, broadcasting/film/video in the Last Year: Never true    Barista in the Last Year: Never true   Transportation Needs: No Transportation Needs    Lack of Transportation (Medical): No    Lack of Transportation (Non-Medical): No   Physical Activity: Not on file   Stress: Not on  file   Social Connections: Not on file   Intimate Partner Violence: Not on file   Housing Stability: Not on file       Allergies     Allergies   Allergen Reactions    Amitriptyline Hives     Patient stated that she received a dose at the hospital and a rash developed all over her face after she arrived home. Ok with nortriptyline.    Haloperidol      Interacts with reglan and also pt had involuntary eye movements.     Latex Itching     Itching irritation swelling condoms  Itching irritation swelling condoms  Itching irritation swelling condoms    Reglan [Metoclopramide]     Morphine Rash       Home Medications     Prior to Admission medications    Medication Sig Start Date End Date Taking? Authorizing Provider   calcium carbonate (TUMS) 500 MG chewable tablet Chew 1 tablet by mouth daily as needed for Heartburn    [provider]   norethindrone-ethinyl estradiol (ORTHO-NOVUM 1/35) 1-35 MG-MCG per tablet Take 1 tablet by mouth daily 02/16/21   Narda Amber, FNP   nortriptyline (PAMELOR) 50 MG capsule Take 50 mg by mouth nightly 03/02/21   [provider]   nortriptyline (PAMELOR) 50 MG capsule Take 1 capsule (50 mg total) by mouth nightly 03/13/21   Jeral Pinch, MD   ondansetron (ZOFRAN-ODT) 4 MG disintegrating tablet Take 1 tablet (4 mg total) by mouth every 6 (six) hours as needed for Nausea 04/03/20   Farrel Demark, MD   pantoprazole (PROTONIX) 40 MG tablet Take 1 tablet (40 mg total) by mouth 2 (two) times daily 08/06/20   Irving Shows, MD   prochlorperazine (COMPAZINE) 5 MG tablet Take 1 tablet (5 mg total) by mouth every 6 (six) hours as needed for Nausea 03/13/21   Jeral Pinch, MD   promethazine (PHENERGAN) 25 MG suppository Place 1 suppository (25 mg total) rectally every 6 (six) hours as needed for Nausea 08/02/20   Ferne Coe, DO   pyridostigmine (MESTINON) 60 MG tablet Take 60 mg by  mouth 2 (two) times daily    [provider]         Review of Systems      Review of Systems   Constitutional:  Negative for chills and fever.   HENT:  Negative for congestion and sore throat.    Respiratory:  Negative for cough and shortness of breath.    Cardiovascular:  Negative for chest pain and palpitations.   Gastrointestinal:  Positive for abdominal pain, nausea and vomiting. Negative for constipation and diarrhea.   Genitourinary:  Negative for dysuria, flank pain, frequency, hematuria and urgency.   Musculoskeletal:  Negative for back pain, joint pain and neck pain.   Neurological:  Positive for dizziness. Negative for sensory change, focal weakness and headaches.   All other systems reviewed and are negative.     Physical Exam     Patient Vitals for the past 24 hrs:   BP Temp Temp src Pulse Resp SpO2   03/15/21 1430 (!) 147/102 -- -- 81 16 98 %   03/15/21 1400 (!) 157/97 -- -- (!) 114 22 98 %   03/15/21 1300 (!) 142/95 -- -- (!) 113 22 96 %   03/15/21 1200 133/85 -- -- 82 20 100 %   03/15/21 1100 120/85 -- -- 95 -- 99 %   03/15/21 1029 122/84 -- -- 99 19 99 %   03/15/21 0935 117/88 97.9 F (36.6 C) Oral (!) 125 -- 96 %   03/15/21 0933 -- -- -- -- 18 --     Physical Exam  Vitals and nursing note reviewed.   Constitutional:       General: She is not in acute distress.     Appearance: Normal appearance. She is not toxic-appearing.      Comments: Slim    HENT:      Head: Normocephalic and atraumatic.      Mouth/Throat:      Mouth: Mucous membranes are moist.   Eyes:      Extraocular Movements: Extraocular movements intact.      Conjunctiva/sclera: Conjunctivae normal.   Cardiovascular:      Rate and Rhythm: Regular rhythm. Tachycardia present.      Pulses: Normal pulses.      Heart sounds: Normal heart sounds.   Pulmonary:      Effort: Pulmonary effort is normal.      Breath sounds: Normal breath sounds.   Abdominal:      General: Bowel sounds are normal. There is no distension.      Palpations: Abdomen is soft.      Tenderness: There is abdominal tenderness (Suprapubic and  epigastric, no r/g).   Musculoskeletal:         General: No swelling. Normal range of motion.      Cervical back: Normal range of motion and neck supple.   Skin:     General: Skin is warm and dry.      Capillary Refill: Capillary refill takes less than 2 seconds.   Neurological:      General: No focal deficit present.      Mental Status: She is alert and oriented to person, place, and time.      Cranial Nerves: No cranial nerve deficit.      Sensory: No sensory deficit.      Motor: No weakness.      Coordination: Coordination normal.   Psychiatric:         Mood and Affect: Mood normal.  Behavior: Behavior normal.        Orders Placed During This Encounter     Orders Placed This Encounter   Procedures    CBC and differential    Comprehensive metabolic panel    Lipase    Urinalysis Reflex to Microscopic Exam- Reflex to Culture    Beta HCG, Qual, Serum    GFR    ECG 12 Lead    Saline Lock         ED Medications Administered     ED Medication Orders (From admission, onward)      Start Ordered     Status Ordering Provider    03/15/21 1418 03/15/21 1417  famotidine (PEPCID) injection 20 mg  Once        Route: Intravenous  Ordered Dose: 20 mg     Last MAR action: Given Shakenya Stoneberg B    03/15/21 1312 03/15/21 1311  promethazine (PHENERGAN) 12.5 mg in sodium chloride 0.9 % 50 mL IVPB  Once        Route: Intravenous  Ordered Dose: 12.5 mg     Last MAR action: Given Jaire Pinkham B    03/15/21 1159 03/15/21 1158  erythromycin (ERYTHROCIN) 125 mg in sodium chloride 0.9 % 250 mL IVPB  Once        Route: Intravenous  Ordered Dose: 125 mg     Last MAR action: Stopped Zoria Rawlinson B    03/15/21 1147 03/15/21 1146  sodium chloride 0.9 % bolus 1,000 mL  Once        Route: Intravenous  Ordered Dose: 1,000 mL     Last MAR action: Stopped Abriella Filkins B    03/15/21 1132 03/15/21 1131    Once        Route: Intravenous  Ordered Dose: 500 mg     Discontinued Jahmiya Guidotti B    03/15/21 1014 03/15/21 1013   pantoprazole (PROTONIX) injection 40 mg  Once        Route: Intravenous  Ordered Dose: 40 mg     Last MAR action: Given Jeily Guthridge B    03/15/21 1014 03/15/21 1013  ondansetron (ZOFRAN) injection 4 mg  Once        Route: Intravenous  Ordered Dose: 4 mg     Last MAR action: Given Javyn Havlin B    03/15/21 0942 03/15/21 0941  sodium chloride 0.9 % bolus 1,000 mL  Once        Route: Intravenous  Ordered Dose: 1,000 mL     Last MAR action: Stopped Pookela Sellin B            Diagnostic Study Results and Data Review     The results of the diagnostic studies below were reviewed by the ED provider:    Labs  Results       Procedure Component Value Units Date/Time    Urinalysis Reflex to Microscopic Exam- Reflex to Culture [[id number]]  (Abnormal) Collected: 03/15/21 1407     Updated: 03/15/21 1424     Urine Type Urine, Clean Ca     Color, UA Yellow     Clarity, UA Clear     Specific Gravity UA 1.018     Urine pH 6.0     Leukocyte Esterase, UA Negative     Nitrite, UA Negative     Protein, UR 30     Glucose, UA Negative     Ketones UA 80     Urobilinogen, UA Normal mg/dL  Bilirubin, UA Negative     Blood, UA Large     RBC, UA 3 - 5 /hpf      WBC, UA 0 - 5 /hpf      Squamous Epithelial Cells, Urine 0 - 5 /hpf      Hyaline Casts, UA 4 - 5 /lpf      Urine Mucus Present    GFR [[id number]] Collected: 03/15/21 1003     Updated: 03/15/21 1048     EGFR >60.0       Comprehensive metabolic panel [[id number]]  (Abnormal) Collected: 03/15/21 1003    Specimen: Blood Updated: 03/15/21 1048     Glucose 106 mg/dL      BUN 13 mg/dL      Creatinine 0.9 mg/dL      Sodium 295 mEq/L      Potassium 3.6 mEq/L      Chloride 95 mEq/L      CO2 24 mEq/L      Calcium 10.4 mg/dL      Protein, Total 7.9 g/dL      Albumin 4.9 g/dL      AST (SGOT) 15 U/L      ALT 13 U/L      Alkaline Phosphatase 78 U/L      Bilirubin, Total 1.3 mg/dL      Globulin 3.0 g/dL      Albumin/Globulin Ratio 1.6     Anion Gap 20.0    Lipase [[id number]]  Collected: 03/15/21 1003    Specimen: Blood Updated: 03/15/21 1048     Lipase 30 U/L     Beta HCG, Qual, Serum [[id number]] Collected: 03/15/21 1003    Specimen: Blood Updated: 03/15/21 1036     Hcg Qualitative Negative    CBC and differential [[id number]]  (Abnormal) Collected: 03/15/21 1003    Specimen: Blood Updated: 03/15/21 1011     WBC 7.94 x10 3/uL      Hgb 17.4 g/dL      Hematocrit 25.4 %      Platelets 343 x10 3/uL      RBC 5.65 x10 6/uL      MCV 88.5 fL      MCH 30.8 pg      MCHC 34.8 g/dL      RDW 13 %      MPV 9.4 fL      Neutrophils 57.5 %      Lymphocytes Automated 33.2 %      Monocytes 7.7 %      Eosinophils Automated 0.4 %      Basophils Automated 0.8 %      Immature Granulocytes 0.4 %      Nucleated RBC 0.0 /100 WBC      Neutrophils Absolute 4.57 x10 3/uL      Lymphocytes Absolute Automated 2.64 x10 3/uL      Monocytes Absolute Automated 0.61 x10 3/uL      Eosinophils Absolute Automated 0.03 x10 3/uL      Basophils Absolute Automated 0.06 x10 3/uL      Immature Granulocytes Absolute 0.03 x10 3/uL      Absolute NRBC 0.00 x10 3/uL             Radiologic Studies  Radiology Results (24 Hour)       ** No results found for the last 24 hours. **              Abnormal results/incidental findings discussed with pt and/or family: yes    Monitors, EKG, Procedures, Critical  Care, and Splints       EKG: Normal sinus rhythm, rate 85 bpm, normal PR/QRS intervals, normal QTC, no acute ST/T changes.  Normal EKG.      MDM and Clinical Notes     Working Differential (not completely inclusive): Gastroparesis, dehydration, electrolyte abnormality, intractable nausea/vomiting, cyclic vomiting syndrome    Nursing records reviewed and agree: Yes    Clinical Notes: 25 y.o. female h/o POTS, asthma, Ehlers-Danlos, gastroparesis, cyclic vomiting syndrome c/o gradual onset of intermittent nausea and vomiting x 6 days.  Discharged from this hospital 2 days ago after she was admitted for intractable nausea and vomiting.  Since she  has been home, has not been able to tolerate p.o.  Has been using Phenergan suppositories as needed.  Last episode of vomiting was this morning.  No fevers.  Does have lower abdominal pain as well due to currently being on her menstrual period and has likely endometriosis.  Has gastroenterologist at [facility] and is scheduled for an endoscopy in 2 weeks.  Smokes marijuana daily, but has not smoked in over a week.    On my evaluation, patient chronically ill-appearing, tacky in the 120s, but normotensive, afebrile and in no apparent distress.  Exam unremarkable, other than some mild suprapubic and epigastric tenderness.  No right upper quadrant tenderness and no rebound or guarding.    For nausea and vomiting, will give IV fluids, Zofran and Protonix and reassess.  We will check labs and p.o. challenge          Re-Eval: Re-eval at   ED Course as of 03/15/21 2040   Sun Mar 15, 2021   1201 Patient states that she is hungry and requests ginger ale.  P.o. challenge in progress.  We will also give dose of erythromycin per mom's request as this has been shown to aid in gastroparesis. [MC]   1311 Patient attempted to drink ginger ale but is nauseated and requests dose of Phenergan. [MC]   1416 Patient still experiencing nausea and indigestion after receiving Phenergan.  Will give a dose of famotidine and reassess.  If no improvement, will consider admission for intractable nausea versus patient going home. [MC]   1619 Patient is not feeling any better and vomited after Pepcid.  She would like to be admitted.  Messaged hospitalist at this time. [MC]      ED Course User Index  [MC] Larwence Tu B, PA     Phone Conversation: n/a      Prescriptions       New Prescriptions    No medications on file       Diagnosis and Disposition     Clinical Impression:  No diagnosis found.  Final diagnoses:   None       Disposition:  ED Disposition       None                  Rendering Provider: Algernon Huxley, PA-C    Attending's  signature signifies review of the provider note and clinical impression.      This note was generated by the [facility] EMR system/Dragon speech recognition and may contain inherent errors or omissions not intended by the user. Grammatical errors, random word insertions, deletions, pronoun errors and incomplete sentences are occasional consequences of this technology due to software limitations. Not all errors are caught or corrected. If there are questions or concerns about the content of this note or information contained within the body of this dictation they  should be addressed directly with the author for clarification.              Melvern Banker, [place]  03/15/21 2040       Chase Picket, MD  03/17/21 [phone]

## 2021-03-15 NOTE — ED Notes (Signed)
Pt crying and increased nausea. PA aware.

## 2021-03-15 NOTE — H&P (Signed)
[facility] Practice Admission H&P  Physician Available 24 hours a day:  Please contact team using the phone number in the sticky note, or Epic chat the residents.  Office#: [phone]       Date Time: 03/15/21 5:58 PM  Patient Name: Bridget [name]  Attending Physician: Angelina Sheriff, MD  Primary Care Physician: Narda Amber, FNP    CC: Intractable nausea and vomiting       Assessment:     Active Hospital Problems    Diagnosis    Intractable nausea and vomiting       25 y.o. female with known history of asthma, Ehlers-Danlos, POTS, cyclic vomiting syndrome and possible gastroparesis, admitted with dehydration 2/2 intractable vomiting.     Plan:   #Intractable nausea/vomiting  Hx of multiple ED visits and past admission for this issue. Following by GI. Working ddx: gastroparesis iso medication noncompliance vs marijuana overuse   EKG 441      - IV protonix 40 mg BID given intractable vomiting  - Compazine 5mg  IV or PO Q6h PRN  - Zofran 4mg  IV Q6h scheduled, alternated with Phenergan    - Phenergan 25mg  IV Q6h scheduled  - Erythromycin 100mg  Q8h scheduled      - s/p NS IVF in ED, continue maintenance IVF with LR @ 90cc/hr   - Avoid Reglan and Haldol as pt reports adverse reactions to these in the past     - Hold PO amitriptyline 50mg  QHS for now until can tolerate PO   - Consider Carafate 4 times daily PRN with meals when restarting PO    - Counsel on marijuana cessation     #POTS  - Stable, asymptomatic  - Pt followed by cardiology Dr. Cleotis Lema  - Hold home Pyridostigmine 60 mg BID until tolerate PO   - Continue to monitor BP and HR     #FEN/GI - 125 cc/hr, Clear Liquid Diet with instructions to advance as tolerated      #PPX  - DVT - Lovenox   - GI - (see protonix above)      CODE STATUS: Full    DISPO:  Based on the information available on the day of this admission, patient will be admitted to service status:  Observation:patient is stable and improving. due to intractable  vomiting  Anticipated medical stability for discharge:Yellow - maybe tomorrow  Anticipated discharge needs: None    History of Presenting Illness:   [name] is a 25 y.o. female with known history of asthma, Ehlers-Danlos, POTS, cyclic vomiting syndrome and possible gastroparesis, and recent admission on 7/29 for the same issue who presented intractable nausea/vomiting.    Patient left the hospital Friday night, she felt pretty good. Yesterday woke up feeling very well. As the day went on patient became deteriorated. Tried ginger rale and ice pop but vomited 3 hours later. Later evening patient felt worst and vomited again last night and this morning.   In the ED, patient received multiple anti-emetic medications and seemed to improve with IV Zofran and IV Phenergan. She vomited x1 going to the bathroom.   Patient was previously hospitalized twice for this same issue pre-pandemic. And 3 times in 2021.     "Per HPI 03/13/21  Bridget [name] is a 25 y.o. female with known history of asthma, Ehlers-Danlos, POTS, cyclic vomiting syndrome and possible gastroparesis, presented intractable nausea/vomiting     Bridget [name] forgot to take Sunday dose of Nortriptyline and Protonix prior to going  on a long car ride. During this time she also did not have any food to eat or her phenergan with her, so was unable to stop her nausea. She started having worsening n/v since then.      Received Phenergan suppository on Tuesday but did not help. Went to ED in [place] and 2L of fluids on Wednesday. Received IV Protonix and felt mildly better and was able to tolerate food (rice and broth) that day.     Flew back last night from [place] - flight was delayed and took her phenergan late, and fell behind on her medications again.      N/v/dysmenorrhea and has not been able to take her OCP since Saturday, so now has abdominal cramping similar to her period pains.      Currently continuing Marijuana nightly at the same dose  she has. Had tried dranabinol but it did not help. "    ED course:  - Witnessed palpitation events where cardiac monitor reading normal sinus rhythm with no PVCs or ectopic beats   - S/p Phenergan, zofran, protonix with continued nausea/vomiting   - 1L NS.   - CBC with hemoconcentration. BMP with AG acidosis    SPECIALISTS:  Dr. Hayden Rasmussen  - [facility]  Dr. Trula Slade [facility] - EGD scheduled in 2 weeks   Dr. Cleotis Lema - Cardiology, saw 2 weeks ago and felt that her POTS was not well controlled Increased Mestinone       Past Medical History:     Past Medical History:   Diagnosis Date    Abdominal pain     Asthma     mild seasonal controlled    Complication of anesthesia     Cyclical vomiting     Ehlers-Danlos disease     Gastroparesis     Post-operative nausea and vomiting     POTS (postural orthostatic tachycardia syndrome)     [facility]/stable for many yrs    Sinus trouble     seasonal    UTI (urinary tract infection)     Yeast infection involving the vagina and surrounding area 05/09/2015       Past Surgical History:     Past Surgical History:   Procedure Laterality Date    ADENOIDECTOMY      EGD N/A 01/23/2018    Procedure: EGD;  Surgeon: Jacqulyn Cane, MD;  Location: [facility];  Service: Gastroenterology;  Laterality: N/A;  EGD  Q1=N/A    TONSILLECTOMY      WISDOM TOOTH EXTRACTION         Family History:     Family History   Problem Relation Age of Onset    Cancer Mother     Ehlers-Danlos syndrome Sister     Asthma Brother        Social History:     Social History     Socioeconomic History    Marital status: Single     Spouse name: Not on file    Number of children: Not on file    Years of education: Not on file    Highest education level: Not on file   Occupational History    Not on file   Tobacco Use    Smoking status: Never    Smokeless tobacco: Never    Tobacco comments:     the patient vapes nicotine    Vaping Use    Vaping Use: Some days    Start date:  04/14/2018  Substance and Sexual Activity    Alcohol use: Not Currently    Drug use: Yes     Types: Marijuana     Comment: Pt. uses marijuana every day.    Sexual activity: Yes     Partners: Male     Comment: takes the Pill continuously   Other Topics Concern    Poor school performance No    International travel in past 12 mos No   Social History Narrative    Not on file     Social Determinants of Health     Financial Resource Strain: Low Risk     Difficulty of Paying Living Expenses: Not hard at all   Food Insecurity: No Food Insecurity    Worried About Programme researcher, broadcasting/film/video in the Last Year: Never true    Barista in the Last Year: Never true   Transportation Needs: No Transportation Needs    Lack of Transportation (Medical): No    Lack of Transportation (Non-Medical): No   Physical Activity: Not on file   Stress: Not on file   Social Connections: Not on file   Intimate Partner Violence: Not on file   Housing Stability: Not on file       Allergies:     Allergies   Allergen Reactions    Amitriptyline Hives     Patient stated that she received a dose at the hospital and a rash developed all over her face after she arrived home. Ok with nortriptyline.    Haloperidol      Interacts with reglan and also pt had involuntary eye movements.     Latex Itching     Itching irritation swelling condoms  Itching irritation swelling condoms  Itching irritation swelling condoms    Reglan [Metoclopramide]     Morphine Rash       Medications:     Current/Home Medications    CALCIUM CARBONATE (TUMS) 500 MG CHEWABLE TABLET    Chew 1 tablet by mouth daily as needed for Heartburn    NORETHINDRONE-ETHINYL ESTRADIOL (ORTHO-NOVUM 1/35) 1-35 MG-MCG PER TABLET    Take 1 tablet by mouth daily    NORTRIPTYLINE (PAMELOR) 50 MG CAPSULE    Take 50 mg by mouth nightly    ONDANSETRON (ZOFRAN-ODT) 4 MG DISINTEGRATING TABLET    Take 1 tablet (4 mg total) by mouth every 6 (six) hours as needed for Nausea    PANTOPRAZOLE (PROTONIX) 40 MG TABLET     Take 1 tablet (40 mg total) by mouth 2 (two) times daily    PROCHLORPERAZINE (COMPAZINE) 5 MG TABLET    Take 1 tablet (5 mg total) by mouth every 6 (six) hours as needed for Nausea    PROMETHAZINE (PHENERGAN) 25 MG SUPPOSITORY    Place 1 suppository (25 mg total) rectally every 6 (six) hours as needed for Nausea    PYRIDOSTIGMINE (MESTINON) 60 MG TABLET    Take 60 mg by mouth 2 (two) times daily        Review of Systems:   All other systems were reviewed and are negative except: per HPI    Physical Exam:     VITAL SIGNS: Physical Exam:   Temp:  [97.9 F (36.6 C)-98.2 F (36.8 C)] 98.2 F (36.8 C)  Heart Rate:  [81-125] 82  Resp Rate:  [16-22] 17  BP: (117-157)/(84-102) 137/99  There is no height or weight on file to calculate BMI.    Intake/Output Summary (Last 24 hours) at  03/15/2021 1758  Last data filed at 03/15/2021 1605  Gross per 24 hour   Intake 2250 ml   Output --   Net 2250 ml    GEN: alert and oriented x 3; NAD  HEENT: PERRL, EOMI, sclera anicteric, oropharynx clear without lesions, MMM  NECK: supple, no lymphadenopathy, no thyromegaly, no JVD, no bruits  CARDS: RRR, no murmurs, rubs or gallops  LUNGS: clear to auscultation bilaterally, without wheezing, rhonchi, or rales  ABD: soft, tenderness across all quadrants, non-distended; no palpable masses, no hepatosplenomegaly, normoactive bowel sounds, no rebound or guarding  EXT: no clubbing, cyanosis, or edema. Cap refill 3-4s  NEURO: cranial nerves grossly intact, strength 5/5 in upper and lower extremities, sensation intact  SKIN: no rashes or lesions noted             Labs Reviewed:     Results       Procedure Component Value Units Date/Time    COVID-19 (SARS-COV-2) [facility] Rapid) [[id number]] Collected: 03/15/21 1744    Specimen: Nasopharyngeal Swab from Nasopharynx Updated: 03/15/21 1748     Purpose of COVID testing Screening     SARS-CoV-2 Specimen Source Nasopharyngeal    Narrative:      o Collect and clearly label specimen type:  o Upper respiratory  specimen: One Nasopharyngeal Dry Swab NO  Transport Media.  o Hand deliver to laboratory ASAP  Indication for testing->Extended care facility admission to  semi private room  Screening    Urinalysis Reflex to Microscopic Exam- Reflex to Culture [[id number]]  (Abnormal) Collected: 03/15/21 1407     Updated: 03/15/21 1424     Urine Type Urine, Clean Ca     Color, UA Yellow     Clarity, UA Clear     Specific Gravity UA 1.018     Urine pH 6.0     Leukocyte Esterase, UA Negative     Nitrite, UA Negative     Protein, UR 30     Glucose, UA Negative     Ketones UA 80     Urobilinogen, UA Normal mg/dL      Bilirubin, UA Negative     Blood, UA Large     RBC, UA 3 - 5 /hpf      WBC, UA 0 - 5 /hpf      Squamous Epithelial Cells, Urine 0 - 5 /hpf      Hyaline Casts, UA 4 - 5 /lpf      Urine Mucus Present    GFR [[id number]] Collected: 03/15/21 1003     Updated: 03/15/21 1048     EGFR >60.0       Comprehensive metabolic panel [[id number]]  (Abnormal) Collected: 03/15/21 1003    Specimen: Blood Updated: 03/15/21 1048     Glucose 106 mg/dL      BUN 13 mg/dL      Creatinine 0.9 mg/dL      Sodium 629 mEq/L      Potassium 3.6 mEq/L      Chloride 95 mEq/L      CO2 24 mEq/L      Calcium 10.4 mg/dL      Protein, Total 7.9 g/dL      Albumin 4.9 g/dL      AST (SGOT) 15 U/L      ALT 13 U/L      Alkaline Phosphatase 78 U/L      Bilirubin, Total 1.3 mg/dL      Globulin 3.0 g/dL      Albumin/Globulin Ratio  1.6     Anion Gap 20.0    Lipase [[id number]] Collected: 03/15/21 1003    Specimen: Blood Updated: 03/15/21 1048     Lipase 30 U/L     Beta HCG, Qual, Serum [[id number]] Collected: 03/15/21 1003    Specimen: Blood Updated: 03/15/21 1036     Hcg Qualitative Negative    CBC and differential [[id number]]  (Abnormal) Collected: 03/15/21 1003    Specimen: Blood Updated: 03/15/21 1011     WBC 7.94 x10 3/uL      Hgb 17.4 g/dL      Hematocrit 08.6 %      Platelets 343 x10 3/uL      RBC 5.65 x10 6/uL      MCV 88.5 fL      MCH 30.8 pg      MCHC 34.8 g/dL       RDW 13 %      MPV 9.4 fL      Neutrophils 57.5 %      Lymphocytes Automated 33.2 %      Monocytes 7.7 %      Eosinophils Automated 0.4 %      Basophils Automated 0.8 %      Immature Granulocytes 0.4 %      Nucleated RBC 0.0 /100 WBC      Neutrophils Absolute 4.57 x10 3/uL      Lymphocytes Absolute Automated 2.64 x10 3/uL      Monocytes Absolute Automated 0.61 x10 3/uL      Eosinophils Absolute Automated 0.03 x10 3/uL      Basophils Absolute Automated 0.06 x10 3/uL      Immature Granulocytes Absolute 0.03 x10 3/uL      Absolute NRBC 0.00 x10 3/uL               Imaging Reviewed:     No results found.      Signed by: Charlyn Minerva, MD  Office #: [phone]     MW:[id number], Octaviano Batty, FNP

## 2021-03-15 NOTE — Plan of Care (Signed)
Problem: Safety  Goal: Patient will be free from injury during hospitalization  Outcome: Progressing  Flowsheets (Taken 03/15/2021 2026)  Patient will be free from injury during hospitalization:   Assess patient's risk for falls and implement fall prevention plan of care per policy   Provide and maintain safe environment   Use appropriate transfer methods   Ensure appropriate safety devices are available at the bedside   Hourly rounding  Goal: Patient will be free from infection during hospitalization  Outcome: Progressing  Flowsheets (Taken 03/15/2021 2026)  Free from Infection during hospitalization:   Assess and monitor for signs and symptoms of infection   Monitor lab/diagnostic results   Monitor all insertion sites (i.e. indwelling lines, tubes, urinary catheters, and drains)     Problem: Pain  Goal: Pain at adequate level as identified by patient  Outcome: Progressing  Flowsheets (Taken 03/15/2021 2026)  Pain at adequate level as identified by patient:   Identify patient comfort function goal   Assess for risk of opioid induced respiratory depression, including snoring/sleep apnea. Alert healthcare team of risk factors identified.   Assess pain on admission, during daily assessment and/or before any "as needed" intervention(s)   Reassess pain within 30-60 minutes of any procedure/intervention, per Pain Assessment, Intervention, Reassessment (AIR) Cycle   Evaluate if patient comfort function goal is met   Evaluate patient's satisfaction with pain management progress   Offer non-pharmacological pain management interventions

## 2021-03-15 NOTE — ED Notes (Signed)
Pt walked to bathroom with tech standby assistance. Steady on feet.

## 2021-03-15 NOTE — EDIE (Signed)
COLLECTIVE?NOTIFICATION?03/15/2021 09:26?Bridget [name], Bridget [name]?MRN: [id number]    Criteria Met      5 ED Visits in 12 Months    Security and Safety  No Security Events were found.  ED Care Guidelines  There are currently no ED Care Guidelines for this patient. Please check your facility's medical records system.        Prescription Monitoring Program  000??- Narcotic Use Score  000??- Sedative Use Score  000??- Stimulant Use Score  000??- Overdose Risk Score  - All Scores range from 000-999 with 75% of the population scoring < 200 and on 1% scoring above 650  - The last digit of the narcotic, sedative, and stimulant score indicates the number of active prescriptions of that type  - Higher Use scores correlate with increased prescribers, pharmacies, mg equiv, and overlapping prescriptions  - Higher Overdose Risk Scores correlate with increased risk of unintentional overdose death   Concerning or unexpectedly high scores should prompt [name] review of the PMP record; this does not constitute checking PMP for prescribing purposes.    E.D. Visit Count (12 mo.)  Facility Visits   [name] [place] 1   [facility] [name] [name] 9   Bridget [name] [facility]: [name]) 1   Bridget [name] 1   Total 12   Note: Visits indicate total known visits.     Recent [name] Department Visit Summary  Showing 10 most recent visits out of 12 in the past 12 months   Date Facility [name] Type Diagnoses or Chief Complaint    Mar 15, 2021  [name]      Vomitting, fatigue/ low blood sugar      Mar 13, 2021  Bridget [name]  [name]      palpitations; Dizzy;      Palpitations      Vomiting, unspecified      Hypoglycemia, unspecified      Acidosis      Dehydration      Gastroparesis      Mar 11, 2021  Bridget [name]  [name]  Chief Complaint: VOMITING    Aug 04, 2020  [facility]ridget [name]  [name]      Emesis      Right upper quadrant pain      Dehydration       Tachycardia, unspecified      Aug 01, 2020  [facility]ridget [name]  [name]      Emesis      Nausea with vomiting, unspecified      Cyclical vomiting syndrome unrelated to migraine      Jul 31, 2020  [facility]rid[facility] [name]  [name]      Emesis; Nausea      Emesis      Nausea with vomiting, unspecified      Jul 30, 2020  [facility] [name]  [name]      Nausea; Vomitting      Emesis      Cyclical vomiting syndrome unrelated to migraine      Dehydration      Apr 06, 2020  Bridget [name]  [name]      N/V;abd pain      Abdominal Pain      Dehydration  Cyclical vomiting syndrome unrelated to migraine      Apr 04, 2020  Bridget [name] [facility]: [name])  Bridget [name]      weakness; not able to keep food or liquid down      Emesis      Abdominal Pain      Acidosis      Other chronic pain      Elevated blood-pressure reading, without diagnosis of hypertension      Cyclical vomiting syndrome unrelated to migraine      Generalized abdominal pain      Nausea with vomiting, unspecified      Other disorders of electrolyte and fluid balance, not elsewhere classified      Apr 02, 2020  [facility]ridget [name]  [name]      ABD PAIN- emesis      Abdominal Pain      Cyclical vomiting syndrome unrelated to migraine        Recent Inpatient Visit Summary  No Recent Inpatient Visits were found.  Care Team  Provider Specialty Phone Fax Service Dates   Bridget Dust, MD Pediatrics [phone] Current      Collective Portal  This patient has registered at the [name]   For more information visit: [email or web address]     PLEASE NOTE:     1.   Any care recommendations and other clinical information are provided as guidelines or for historical purposes only, and providers should exercise their own clinical judgment when providing  care.    2.   You may only use this information for purposes of treatment, payment or health care operations activities, and subject to the limitations of applicable Collective Policies.    3.   You should consult directly with the organization that provided [name] care guideline or other clinical history with any questions about additional information or accuracy or completeness of information provided.    ? 2022 [place], [employer]. - [email or web address]

## 2021-03-15 NOTE — ED Notes (Signed)
Walked to bathroom with 1 assist. States feeling better, still refusing food.

## 2021-03-15 NOTE — Plan of Care (Addendum)
[facility] Resident Note  Office: [phone]      Evaluated patient at admission with Dr. Laveda Norman, I agree with his A/P as outlined in full H&P note. Pertinent elements of history and exam independently reproduced below.    CC: nausea    HPI: 25 y.o. female with known asthma, Ehlers-Danlos, POTS, cyclic vomiting syndrome and possible gastroparesis, presented intractable nausea/vomiting. She was admitted 2 days ago for these symptoms and discharged same evening because feeling better and preference to go home. Her symptoms started about a week ago after missing a dose of nortriptyline and protonix. 3 nights ago was feeling better, then 1 day ago had progressive hunger which lead to nausea and vomiting again that lasted through today. Last episode of vomiting 1.5 hours ago. Having difficulty with PO intake. Was given multiple different antiemetics in the ER without improvement. Had multiple hospitalizations (about 5x) in the past for same symptoms. Reports has hx taking marijuana nightly for appetite and sleep, last dose over a week ago, has not taken it during this episode of n/v within the last week.     Temp:  [97.9 F (36.6 C)] 97.9 F (36.6 C)  Heart Rate:  [81-125] 81  Resp Rate:  [16-22] 16  BP: (117-157)/(84-102) 147/102      PE:   Gen: well-appearing, NAD  HEENT: EOMI, PERRL, MMM, oropharynx clear without erythema or exudate  Neck: no LAD  CV: RRR, no murmurs, rubs, or gallops  Lungs: breathing comfortably on RA, CTAB, no wheezes, rales, or rhonchi  Abdomen: soft, non-distended, TTP throughout the abd  Ext: warm, distal pulses intact, no LE edema  Neuro: awake and alert, no focal deficits  Skin: no rashes or lesions noted    A/P: 26 y.o. female w/ PMHx mild intermittent asthma, Ehlers-Danlos, POTS, cyclic vomiting syndrome and possible gastroparesis, presented intractable nausea/vomiting, concerning for recurrence of cyclic vomiting syndrome. Admitted for dehydration and inability to PO.   --  admit to med tele  -- EKG for Qtc; Qtc 441.   -- scheduled IV phenergen and zofran  -- scheduled IV erythromcyin for motility/antiemetic  -- IV protonix BID  -- prn compazine if develops worsening vomiting  -- s/p 2L bolus NaCl  -- on mIVF LR @90  cc/hr   -- regular diet; transition to oral as pt feels comfortable/self-regulate  -- add in oral nortriptyline when she feels she can tolerate  -- plan for eventual outpatient EGD with [facility] in a couple weeks  -- continue to encourage to avoid marijuana use as possible contributor        Garrel Ridgel, MD  [facility], PGY-3

## 2021-03-16 DIAGNOSIS — K30 Functional dyspepsia: Secondary | ICD-10-CM

## 2021-03-16 DIAGNOSIS — R112 Nausea with vomiting, unspecified: Secondary | ICD-10-CM

## 2021-03-16 DIAGNOSIS — R1115 Cyclical vomiting syndrome unrelated to migraine: Secondary | ICD-10-CM

## 2021-03-16 DIAGNOSIS — N809 Endometriosis, unspecified: Secondary | ICD-10-CM

## 2021-03-16 DIAGNOSIS — I498 Other specified cardiac arrhythmias: Secondary | ICD-10-CM

## 2021-03-16 DIAGNOSIS — Q796 Ehlers-Danlos syndrome, unspecified: Secondary | ICD-10-CM

## 2021-03-16 DIAGNOSIS — R1110 Vomiting, unspecified: Secondary | ICD-10-CM

## 2021-03-16 LAB — ECG 12-LEAD
Atrial Rate: 85 {beats}/min
Atrial Rate: 84 {beats}/min
IHS MUSE NARRATIVE AND IMPRESSION: NORMAL
IHS MUSE NARRATIVE AND IMPRESSION: NORMAL
P Axis: 69 degrees
P Axis: 75 degrees
P-R Interval: 122 ms
P-R Interval: 130 ms
Q-T Interval: 356 ms
Q-T Interval: 374 ms
QRS Duration: 82 ms
QRS Duration: 88 ms
QTC Calculation (Bezet): 423 ms
QTC Calculation (Bezet): 441 ms
R Axis: 75 degrees
R Axis: 69 degrees
T Axis: 68 degrees
T Axis: 65 degrees
Ventricular Rate: 85 {beats}/min
Ventricular Rate: 84 {beats}/min

## 2021-03-16 LAB — BASIC METABOLIC PANEL
Anion Gap: 14 (ref 5.0–15.0)
BUN: 5 mg/dL — ABNORMAL LOW (ref 7–19)
CO2: 23 mEq/L (ref 22–29)
Calcium: 9.2 mg/dL (ref 8.5–10.5)
Chloride: 100 mEq/L (ref 100–111)
Creatinine: 0.7 mg/dL (ref 0.6–1.0)
Glucose: 82 mg/dL (ref 70–100)
Potassium: 3.5 mEq/L (ref 3.5–5.1)
Sodium: 137 mEq/L (ref 136–145)

## 2021-03-16 LAB — CBC
Absolute NRBC: 0 10*3/uL (ref 0.00–0.00)
Hematocrit: 38.9 % (ref 34.7–43.7)
Hgb: 13.5 g/dL (ref 11.4–14.8)
MCH: 30.8 pg (ref 25.1–33.5)
MCHC: 34.7 g/dL (ref 31.5–35.8)
MCV: 88.6 fL (ref 78.0–96.0)
MPV: 9.8 fL (ref 8.9–12.5)
Nucleated RBC: 0 /100 WBC (ref 0.0–0.0)
Platelets: 283 10*3/uL (ref 142–346)
RBC: 4.39 10*6/uL (ref 3.90–5.10)
RDW: 13 % (ref 11–15)
WBC: 8.05 10*3/uL (ref 3.10–9.50)

## 2021-03-16 LAB — GFR: EGFR: >60

## 2021-03-16 MED ORDER — NORTRIPTYLINE HCL 25 MG PO CAPS
50.0000 mg | ORAL_CAPSULE | Freq: Every day | ORAL | Status: DC
Start: 2021-03-16 — End: 2021-03-16
  Filled 2021-03-16: qty 2

## 2021-03-16 MED ORDER — NORTRIPTYLINE HCL 25 MG PO CAPS
50.0000 mg | ORAL_CAPSULE | Freq: Every evening | ORAL | Status: DC
Start: 2021-03-16 — End: 2021-03-17
  Filled 2021-03-16 (×2): qty 2

## 2021-03-16 MED ORDER — KETOROLAC TROMETHAMINE 30 MG/ML IJ SOLN
15.0000 mg | Freq: Once | INTRAMUSCULAR | Status: AC
Start: 2021-03-16 — End: 2021-03-16
  Administered 2021-03-16: 13:00:00 15 mg via INTRAVENOUS
  Filled 2021-03-16: qty 1

## 2021-03-16 NOTE — Progress Notes (Signed)
Assessment Smart phrase for obs and low risk patient        Introduced [employer] and self to patient and/or family Educational psychologist (name and contact tel #) Y    Discussed plan of care and possible discharge needs with patient/family/care-giver (Y, N) - if family/care-giver, provide name: Y    Patient has cognitive ability to participate in care decisions and follow-up care as needed (Y, N) - provide comments if no: Y    Verified (and corrected if needed) demographics and PCP on facesheet (Y, N) Y    Assessed for lack of insurance or underinsured. (Y, N) Y  If no insurance or underinsured, patient is high risk and needs high risk assessment     Patient is NOT identified as a Medicare focused patient or high risk for failed discharge plan or readmission as detailed in the High Risk Patient Screening policy     LACE = 5  Plan of care: continue IVF, IV phenergan and advance diet    Discharge Plan A: home with parents  Discharge Plan B:    Expected discharge date: TBD    Barriers to discharge: none              a) Barriers needing escalation:              b) Escalated to:     Comments: CM met with patient at bedside to introduce role and complete assessment. Patient stated she was independent prior to admission, living with her parents, and working part time and is a Consulting civil engineer. CM provided contact information and will continue to follow.    Jacinto Reap, LMSW  Case Manager   [facility]  [phone]

## 2021-03-16 NOTE — Progress Notes (Signed)
[facility] Progress Note  Physician Available 24 hours a Jama Krichbaum:  Please call number in sticky note to reach hospital team.  Office#: [phone]     Date Time: 03/16/21 8:03 AM  Patient Name: Bridget [name]  Attending Physician: Federico Flake, MD  Primary Care Physician: Narda Amber, FNP    CC: Intractable nausea and vomiting    Assessment:   Principal Problem:    Intractable nausea and vomiting  Resolved Problems:    * No resolved hospital problems. *      25 y.o. female with known history of asthma, Ehlers-Danlos, POTS, cyclic vomiting syndrome and possible gastroparesis, admitted with dehydration 2/2 intractable vomiting. Patient is receiving IV fluid with IV anti-nausea medications at this time with plan to transition to po over the next 24 hours.      Plan:   #Intractable nausea/vomiting  Hx of multiple ED visits and past admission for this issue. Following by GI. Working ddx: gastroparesis iso medication noncompliance vs marijuana overuse   EKG 441      - Continue IV protonix 40 mg daily   - Compazine 5mg  IV or PO Q6h PRN  - Zofran 4mg  IV Q6h scheduled, alternated with Phenergan    - Phenergan 12.5 mg IV Q6h scheduled  - Erythromycin 125 mg Q8h scheduled   - Continue maintenance IVF with LR @ 90cc/hr   - Avoid Reglan and Haldol as pt reports adverse reactions to these in the past  - Reintroduce PO amitriptyline 50mg  this afternoon   - Counsel on marijuana cessation     #Mildly elevated blood pressure  - Systolic BP in the 113-157/72-102   - May be related to nausea/vomiting if pressure taken after episodes   - Less likely benign essential HTN given age and BMI   - No acute intervention at this time    - Continue to monitor BP      #POTS  - Stable, asymptomatic  - Pt followed by cardiology Dr. Cleotis Lema  - Hold home Pyridostigmine 60 mg BID until tolerate PO; this may be contributing to nausea and vomiting   - Continue to monitor BP and HR     #Menstrual cramps   -Contributes to  her nausea and vomiting   -Toradol 15 mg x 1 today     #FEN/GI - LR 90 cc/hr, will trial clear liquids this morning; advance as tolerated       #PPX  - DVT - Lovenox   - GI - (see protonix above)      CODE STATUS: Full    DISPO:  Today's date: 03/16/2021  Admit Date: 03/15/2021  9:38 AM  Anticipated medical stability for discharge:Yellow - maybe tomorrow  Service status: Observation: Due to anticipation of improvement of symptoms.   Anticipated discharge needs: improvement in symptoms, tolerating po     Subjective:     Interval History/HPI:     Patient continuing to experience nausea this morning. Was not able to tolerate any po or liquid last night. Endorses some abdominal cramping, more so related to her menstrual cycle. She denied any fever, chills, CP, SOB. Notes that she had 1 episode of vomiting overnight, nonbloody, but thinks it was bilious (green). She notes that she would be willing to try a clear liquid diet after getting some anti-nausea medication this morning.     Review of Systems:     Denies fever, sweats, chills, chest pain, dyspnea,     Physical Exam:  VITAL SIGNS: Physical Exam:   Temp:  [97.9 F (36.6 C)-99 F (37.2 C)] 98.4 F (36.9 C)  Heart Rate:  [81-125] 102  Resp Rate:  [16-22] 16  BP: (115-157)/(72-102) 123/78      Intake/Output Summary (Last 24 hours) at 03/16/2021 0803  Last data filed at 03/15/2021 1605  Gross per 24 hour   Intake 2250 ml   Output --   Net 2250 ml    GEN: awake, alert and oriented x 3  HEENT: MMM  CARDS: regular rate and rhythm, no m/r/g  LUNGS: clear to auscultation bilaterally, without wheezing, rhonchi, or rales  ABD: soft, non-tender, non-distended; no palpable masses,  normoactive BS  EXT: no edema         Current Meds:     Current Facility-Administered Medications   Medication Dose Route Frequency    enoxaparin  40 mg Subcutaneous Daily    erythromycin  125 mg Intravenous Q8H    ondansetron  4 mg Intravenous Q6H    pantoprazole  40 mg Intravenous Daily     promethazine (PHENERGAN) IVPB  12.5 mg Intravenous 4 times per Huan Pollok     Current Facility-Administered Medications   Medication Dose Route Frequency Last Rate    lactated ringers   Intravenous Continuous 90 mL/hr at 03/15/21 1846     Current Facility-Administered Medications   Medication Dose Route    glucagon (rDNA)  1 mg Intramuscular    And    dextrose  250 mL Intravenous    And    dextrose  25 g Intravenous    And    dextrose  25 g Intravenous    magnesium sulfate  1 g Intravenous    melatonin  3 mg Oral    naloxone  0.2 mg Intravenous    potassium & sodium phosphates  2 packet Oral    potassium chloride  0-40 mEq Oral    And    potassium chloride  10 mEq Intravenous    prochlorperazine  5 mg Intravenous     Labs:     Labs (last 72 hours):    Recent Labs   Lab 03/16/21  0436 03/15/21  1003   WBC 8.05 7.94   Hgb 13.5 17.4*   Hematocrit 38.9 50.0*   Platelets 283 343          Recent Labs   Lab 03/16/21  0436 03/15/21  1003 03/13/21  1254   Sodium 137 139 136   Potassium 3.5 3.6 4.1   Chloride 100 95* 96*   CO2 23 24 15*   BUN 5* 13 11   Creatinine 0.7 0.9 0.8   Calcium 9.2 10.4 10.2   Albumin  --  4.9 5.4*   Protein, Total  --  7.9 8.9*   Bilirubin, Total  --  1.3* 1.3*   Alkaline Phosphatase  --  78 79   ALT  --  13 16   AST (SGOT)  --  15 19   Glucose 82 106* 66*          Imaging (last 24 hours), reviewed and are significant for:  No results found.    Signed by: Ames Coupe Jonah Gingras, MD   Office#: [phone]     VH:QIONGE, Octaviano Batty, FNP

## 2021-03-16 NOTE — Plan of Care (Signed)
Problem: Safety  Goal: Patient will be free from injury during hospitalization  Outcome: Progressing  Flowsheets (Taken 03/15/2021 2026)  Patient will be free from injury during hospitalization:   Assess patient's risk for falls and implement fall prevention plan of care per policy   Provide and maintain safe environment   Use appropriate transfer methods   Ensure appropriate safety devices are available at the bedside   Hourly rounding  Goal: Patient will be free from infection during hospitalization  Outcome: Progressing  Flowsheets (Taken 03/15/2021 2026)  Free from Infection during hospitalization:   Assess and monitor for signs and symptoms of infection   Monitor lab/diagnostic results   Monitor all insertion sites (i.e. indwelling lines, tubes, urinary catheters, and drains)     Problem: Pain  Goal: Pain at adequate level as identified by patient  Outcome: Progressing  Flowsheets (Taken 03/15/2021 2026)  Pain at adequate level as identified by patient:   Identify patient comfort function goal   Assess for risk of opioid induced respiratory depression, including snoring/sleep apnea. Alert healthcare team of risk factors identified.   Assess pain on admission, during daily assessment and/or before any "as needed" intervention(s)   Reassess pain within 30-60 minutes of any procedure/intervention, per Pain Assessment, Intervention, Reassessment (AIR) Cycle   Evaluate if patient comfort function goal is met   Evaluate patient's satisfaction with pain management progress   Offer non-pharmacological pain management interventions

## 2021-03-16 NOTE — UM Notes (Addendum)
PRESENTED TO ED: 03/15/2021  9:38 AM      ADMITTED TO CLASS:  Observation on 03/15/21, changed to Inpatient on day 3 (see day 3 clinical)    UNIT:  PROGRESSIVE CARE      Reason For Hospitalization:  25 y.o. female with known history of asthma, Ehlers-Danlos, POTS, cyclic vomiting syndrome and possible gastroparesis, intractable N/V, now p/w recurrent intractable nausea/vomiting.  In the ED, patient received multiple anti-emetic medications and seemed to improve with IV Zofran and IV Phenergan. She vomited x1 going to the bathroom.       PMH:  has a past medical history of Abdominal pain, Asthma, Complication of anesthesia, Cyclical vomiting, Ehlers-Danlos disease, Gastroparesis, Post-operative nausea and vomiting, POTS (postural orthostatic tachycardia syndrome), Sinus trouble, UTI (urinary tract infection), and Yeast infection involving the vagina and surrounding area (05/09/2015).      ED Triage Vitals   Enc Vitals Group      BP 03/15/21 0935 117/88      Heart Rate 03/15/21 0935 (!) 125      Resp Rate 03/15/21 0933 18      Temp 03/15/21 0935 97.9 F (36.6 C)      Temp Source 03/15/21 0935 Oral      SpO2 03/15/21 0935 96 %      Weight --       Height 03/15/21 1826 1.626 m (5\' 4" )      Head Circumference --       Peak Flow --       Pain Score 03/15/21 0933 5      Pain Loc --       Pain Edu? --       Excl. in GC? --           LABS:    Results       Procedure Component Value Units Date/Time    Basic Metabolic Panel [[id number]]  (Abnormal) Collected: 03/16/21 0436    Specimen: Blood Updated: 03/16/21 0709     Glucose 82 mg/dL      BUN 5 mg/dL      Creatinine 0.7 mg/dL      Calcium 9.2 mg/dL      Sodium 409 mEq/L      Potassium 3.5 mEq/L      Chloride 100 mEq/L      CO2 23 mEq/L      Anion Gap 14.0    GFR [[id number]] Collected: 03/16/21 0436     Updated: 03/16/21 0709     EGFR >60.0       CBC without differential [[id number]] Collected: 03/16/21 0436    Specimen: Blood Updated: 03/16/21 0551     WBC 8.05 x10 3/uL       Hgb 13.5 g/dL      Hematocrit 57.8 %      Platelets 283 x10 3/uL      RBC 4.39 x10 6/uL      MCV 88.6 fL      MCH 30.8 pg      MCHC 34.7 g/dL      RDW 13 %      MPV 9.8 fL      Nucleated RBC 0.0 /100 WBC      Absolute NRBC 0.00 x10 3/uL     Glucose Whole Blood - POCT [[id number]] Collected: 03/15/21 1731     Updated: 03/15/21 1819     Whole Blood Glucose POCT 98 mg/dL     UXLKG-40 (SARS-COV-2) ([place] [facility]) [[id number]] Collected: 03/15/21 1744  Specimen: Nasopharyngeal Swab from Nasopharynx Updated: 03/15/21 1810     Purpose of COVID testing Screening     SARS-CoV-2 Specimen Source Nasopharyngeal     SARS CoV-2 Negative    Narrative:      o Collect and clearly label specimen type:  o Upper respiratory specimen: One Nasopharyngeal Dry Swab NO  Transport Media.  o Hand deliver to laboratory ASAP  Indication for testing->Extended care facility admission to  semi private room  Screening    Urinalysis Reflex to Microscopic Exam- Reflex to Culture [[id number]]  (Abnormal) Collected: 03/15/21 1407     Updated: 03/15/21 1424     Urine Type Urine, Clean Ca     Color, UA Yellow     Clarity, UA Clear     Specific Gravity UA 1.018     Urine pH 6.0     Leukocyte Esterase, UA Negative     Nitrite, UA Negative     Protein, UR 30     Glucose, UA Negative     Ketones UA 80     Urobilinogen, UA Normal mg/dL      Bilirubin, UA Negative     Blood, UA Large     RBC, UA 3 - 5 /hpf      WBC, UA 0 - 5 /hpf      Squamous Epithelial Cells, Urine 0 - 5 /hpf      Hyaline Casts, UA 4 - 5 /lpf      Urine Mucus Present    GFR [[id number]] Collected: 03/15/21 1003     Updated: 03/15/21 1048     EGFR >60.0       Comprehensive metabolic panel [[id number]]  (Abnormal) Collected: 03/15/21 1003    Specimen: Blood Updated: 03/15/21 1048     Glucose 106 mg/dL      BUN 13 mg/dL      Creatinine 0.9 mg/dL      Sodium 086 mEq/L      Potassium 3.6 mEq/L      Chloride 95 mEq/L      CO2 24 mEq/L      Calcium 10.4 mg/dL      Protein, Total 7.9 g/dL      Albumin  4.9 g/dL      AST (SGOT) 15 U/L      ALT 13 U/L      Alkaline Phosphatase 78 U/L      Bilirubin, Total 1.3 mg/dL      Globulin 3.0 g/dL      Albumin/Globulin Ratio 1.6     Anion Gap 20.0    Lipase [[id number]] Collected: 03/15/21 1003    Specimen: Blood Updated: 03/15/21 1048     Lipase 30 U/L     Beta HCG, Qual, Serum [[id number]] Collected: 03/15/21 1003    Specimen: Blood Updated: 03/15/21 1036     Hcg Qualitative Negative    CBC and differential [[id number]]  (Abnormal) Collected: 03/15/21 1003    Specimen: Blood Updated: 03/15/21 1011     WBC 7.94 x10 3/uL      Hgb 17.4 g/dL      Hematocrit 64.4 %      Platelets 343 x10 3/uL      RBC 5.65 x10 6/uL      MCV 88.5 fL      MCH 30.8 pg      MCHC 34.8 g/dL      RDW 13 %      MPV 9.4 fL      Neutrophils 57.5 %  Lymphocytes Automated 33.2 %      Monocytes 7.7 %      Eosinophils Automated 0.4 %      Basophils Automated 0.8 %      Immature Granulocytes 0.4 %      Nucleated RBC 0.0 /100 WBC      Neutrophils Absolute 4.57 x10 3/uL      Lymphocytes Absolute Automated 2.64 x10 3/uL      Monocytes Absolute Automated 0.61 x10 3/uL      Eosinophils Absolute Automated 0.03 x10 3/uL      Basophils Absolute Automated 0.06 x10 3/uL      Immature Granulocytes Absolute 0.03 x10 3/uL      Absolute NRBC 0.00 x10 3/uL                   ED MEDS:   1L NS IV bolus x 2   Protonix 40mg  IV  Erythromycin 125mg  IV  Phenergan 12.5mg  IV  Pepcid 20mg  IV         PLAN:   #Intractable nausea/vomiting  Hx of multiple ED visits and past admission for this issue. Following by GI. Working ddx: gastroparesis iso medication noncompliance vs marijuana overuse   EKG 441      - IV protonix 40 mg BID given intractable vomiting  - Compazine 5mg  IV or PO Q6h PRN  - Zofran 4mg  IV Q6h scheduled, alternated with Phenergan    - Phenergan 25mg  IV Q6h scheduled  - Erythromycin 100mg  Q8h scheduled      - s/p NS IVF in ED, continue maintenance IVF with LR @ 90cc/hr   - Avoid Reglan and Haldol as pt reports  adverse reactions to these in the past     - Hold PO amitriptyline 50mg  QHS for now until can tolerate PO   - Consider Carafate 4 times daily PRN with meals when restarting PO    - Counsel on marijuana cessation      #POTS  - Stable, asymptomatic  - Pt followed by cardiology Dr. Cleotis Lema  - Hold home Pyridostigmine 60 mg BID until tolerate PO   - Continue to monitor BP and HR     #FEN/GI - 125 cc/hr, Clear Liquid Diet with instructions to advance as tolerated      #PPX  - DVT - Lovenox   - GI - (see protonix above)       Current Facility-Administered Medications   Medication Dose Route Frequency    enoxaparin  40 mg Subcutaneous Daily    erythromycin  125 mg Intravenous Q8H    ondansetron  4 mg Intravenous Q6H    pantoprazole  40 mg Intravenous Daily    promethazine (PHENERGAN) IVPB  12.5 mg Intravenous 4 times per day      lactated ringers 90 mL/hr at 03/15/21 1846           ==========Day 2:  March 16, 2021==========  Vitals:    03/15/21 2005 03/15/21 2341 03/16/21 0417 03/16/21 0730   BP: (!) 138/91 115/72 136/89 123/78   Pulse: 99 86 95 (!) 102   Resp: 16 17 16     Temp: 99 F (37.2 C) 98.2 F (36.8 C) 98.4 F (36.9 C) 98.4 F (36.9 C)   TempSrc: Oral Oral Oral Oral   SpO2: 100% 94% 97% 96%   Height:                Current Facility-Administered Medications   Medication Dose Route Frequency    enoxaparin  40 mg Subcutaneous Daily    erythromycin  125 mg Intravenous Q8H    ondansetron  4 mg Intravenous Q6H    pantoprazole  40 mg Intravenous Daily    promethazine (PHENERGAN) IVPB  12.5 mg Intravenous 4 times per day      lactated ringers 90 mL/hr at 03/15/21 1846         PER MEDICINE:  - Continue IV protonix 40 mg daily   - Compazine 5mg  IV or PO Q6h PRN  - Zofran 4mg  IV Q6h scheduled, alternated with Phenergan    - Phenergan 12.5 mg IV Q6h scheduled  - Erythromycin 125 mg Q8h scheduled   - Continue maintenance IVF with LR @ 90cc/hr   - Avoid Reglan and Haldol as pt reports adverse reactions to these in the  past  - Reintroduce PO amitriptyline 50mg  this afternoon   - Counsel on marijuana cessation      #Mildly elevated blood pressure  - Systolic BP in the 113-157/72-102   - May be related to nausea/vomiting if pressure taken after episodes   - Less likely benign essential HTN given age and BMI   - No acute intervention at this time    - Continue to monitor BP      #POTS  - Stable, asymptomatic  - Pt followed by cardiology Dr. Cleotis Lema  - Hold home Pyridostigmine 60 mg BID until tolerate PO; this may be contributing to nausea and vomiting   - Continue to monitor BP and HR     #Menstrual cramps   -Contributes to her nausea and vomiting   -Toradol 15 mg x 1 today      #FEN/GI - LR 90 cc/hr, will trial clear liquids this morning; advance as tolerated                                                                                                                                                                                         ** This clinical review is compiled from documentation provided by the treatment team in the medical record. **  Amontae Ng "Lisa" Clydene Laming RN, BSN, ACM-RN  Utilization Review Nurse [place]  [address] [place] [address]  [place], [place] [address]  Phone: [phone]  Main Office Phone: [phone]  Fax: [phone]  Tax ID:  [id number]  NPI:  [id number]

## 2021-03-16 NOTE — Discharge Instr - AVS First Page (Addendum)
[facility] Inpatient Service  Discharge Instructions for  Bridget [name]    Date Printed: 03/16/21  Primary Doctor's Name: Narda Amber, FNP, Fax: [phone] Phone: [phone]    Follow up time frame: 3-5 days    Subspecialists: Gastroenterology (Dr. Marlyne Beards), follow up within 1-2 days of discharge.     Please bring this plan and all your medications to your follow up appointments and any future hospital visits    Diagnosis: intractable nausea and vomiting    Medications:   Please take your medication as instructed in the AVS.  Changes to home medications: Schedule your zofran every 8 hours for the next 2 days, then switch back to taking it as needed  New medications prescribed: Erythromycin     Special Instructions: ***  - Progress your diet slowly as you tolerate it. A BRAT diet (banana, rice, applesauce, toast) is a good place to start as you add foods back.  - Avoid marijuana use as it is likely to contribute to your nausea and vomiting      [facility]  [id number]  [address]  [place], [place] [address]

## 2021-03-17 LAB — GFR: EGFR: >60

## 2021-03-17 LAB — PHOSPHORUS: Phosphorus: 3.7 mg/dL (ref 2.3–4.7)

## 2021-03-17 LAB — BASIC METABOLIC PANEL
Anion Gap: 12 (ref 5.0–15.0)
BUN: 4 mg/dL — ABNORMAL LOW (ref 7–19)
CO2: 27 mEq/L (ref 22–29)
Calcium: 9.5 mg/dL (ref 8.5–10.5)
Chloride: 102 mEq/L (ref 100–111)
Creatinine: 0.7 mg/dL (ref 0.6–1.0)
Glucose: 97 mg/dL (ref 70–100)
Potassium: 3.6 mEq/L (ref 3.5–5.1)
Sodium: 141 mEq/L (ref 136–145)

## 2021-03-17 LAB — MAGNESIUM: Magnesium: 1.7 mg/dL (ref 1.6–2.6)

## 2021-03-17 MED ORDER — PANTOPRAZOLE SODIUM 40 MG PO TBEC
40.0000 mg | DELAYED_RELEASE_TABLET | Freq: Two times a day (BID) | ORAL | Status: DC
Start: 2021-03-17 — End: 2021-03-18
  Administered 2021-03-17: 40 mg via ORAL
  Filled 2021-03-17: qty 1

## 2021-03-17 MED ORDER — ONDANSETRON 4 MG PO TBDP
4.0000 mg | ORAL_TABLET | Freq: Four times a day (QID) | ORAL | Status: DC
Start: 2021-03-17 — End: 2021-03-18
  Administered 2021-03-17: 4 mg via ORAL
  Filled 2021-03-17: qty 1

## 2021-03-17 MED ORDER — PROMETHAZINE HCL 6.25 MG/5ML PO SYRP
12.5000 mg | ORAL_SOLUTION | Freq: Four times a day (QID) | ORAL | Status: DC
Start: 2021-03-18 — End: 2021-03-18
  Filled 2021-03-17 (×2): qty 10

## 2021-03-17 MED ORDER — PROMETHAZINE HCL 6.25 MG/5ML PO SYRP
12.5000 mg | ORAL_SOLUTION | Freq: Four times a day (QID) | ORAL | Status: DC | PRN
Start: 2021-03-17 — End: 2021-03-17
  Filled 2021-03-17: qty 10

## 2021-03-17 MED ORDER — PANTOPRAZOLE ORAL SUSPENSION 40 MG/20 ML UD
40.0000 mg | Freq: Two times a day (BID) | ORAL | Status: DC
Start: 2021-03-17 — End: 2021-03-17
  Filled 2021-03-17 (×2): qty 20

## 2021-03-17 MED ORDER — PANTOPRAZOLE ORAL SUSPENSION 40 MG/20 ML UD
40.0000 mg | Freq: Two times a day (BID) | ORAL | Status: DC
Start: 2021-03-17 — End: 2021-03-18
  Filled 2021-03-17 (×2): qty 20

## 2021-03-17 MED ORDER — ERYTHROMYCIN BASE 250 MG PO TABS
250.0000 mg | ORAL_TABLET | Freq: Three times a day (TID) | ORAL | Status: DC
Start: 2021-03-17 — End: 2021-03-18
  Administered 2021-03-17 (×2): 250 mg via ORAL
  Filled 2021-03-17 (×3): qty 1

## 2021-03-17 MED ORDER — PROMETHAZINE HCL 12.5 MG RE SUPP
12.5000 mg | Freq: Four times a day (QID) | RECTAL | Status: DC
Start: 2021-03-18 — End: 2021-03-18
  Filled 2021-03-17 (×2): qty 1

## 2021-03-17 MED ORDER — PROMETHAZINE HCL 12.5 MG RE SUPP
12.5000 mg | Freq: Four times a day (QID) | RECTAL | Status: DC | PRN
Start: 2021-03-17 — End: 2021-03-17
  Administered 2021-03-17: 12.5 mg via RECTAL
  Filled 2021-03-17 (×2): qty 1

## 2021-03-17 MED ORDER — KETOROLAC TROMETHAMINE 30 MG/ML IJ SOLN
15.0000 mg | Freq: Once | INTRAMUSCULAR | Status: AC
Start: 2021-03-17 — End: 2021-03-17
  Administered 2021-03-17: 15 mg via INTRAVENOUS
  Filled 2021-03-17: qty 1

## 2021-03-17 MED ORDER — ONDANSETRON 4 MG PO TBDP
4.0000 mg | ORAL_TABLET | Freq: Four times a day (QID) | ORAL | Status: DC | PRN
Start: 2021-03-17 — End: 2021-03-17

## 2021-03-17 MED ORDER — ERYTHROMYCIN ETHYLSUCCINATE 200 MG/5ML PO SUSR
400.0000 mg | Freq: Three times a day (TID) | ORAL | Status: DC
Start: 2021-03-17 — End: 2021-03-17
  Filled 2021-03-17 (×3): qty 10

## 2021-03-17 MED ORDER — PROMETHAZINE HCL 6.25 MG/5ML PO SYRP
12.5000 mg | ORAL_SOLUTION | Freq: Four times a day (QID) | ORAL | Status: DC | PRN
Start: 2021-03-17 — End: 2021-03-17

## 2021-03-17 MED ORDER — PANTOPRAZOLE SODIUM 40 MG IV SOLR
40.0000 mg | Freq: Two times a day (BID) | INTRAVENOUS | Status: DC
Start: 2021-03-17 — End: 2021-03-17

## 2021-03-17 NOTE — Discharge Summary (Signed)
This note is a work in progress, and is for hospital team use while planning plan of care and discharge. Please do not contact the office regarding its contents. If you have questions about completed and signed notes, ask our team in person when we round.          [facility] Discharge Summary  Physician Available 24 hours a Bridget [name]:  Office#: [phone]       Date Time: 03/17/21 9:00 AM  Patient Name: Bridget [name]  Attending Physician: Federico Flake, MD  Primary Care Physician: Narda Amber, FNP    Date of Admission: 03/15/2021  Date of Discharge: 03/17/21    Hospitalization Diagnoses:     Principal Problem:    Intractable nausea and vomiting  Resolved Problems:    * No resolved hospital problems. *      Disposition:     AMA    Pending Results, Recommendations & Instructions after discharge:     Micro / Labs / Path pending: none    Date of completion for antibiotics or other medications: none    HPI:   Per admission H&P: "Bridget [name] is a 25 y.o. female with known history of asthma, Ehlers-Danlos, POTS, cyclic vomiting syndrome and possible gastroparesis, and recent admission on 7/29 for the same issue who presented intractable nausea/vomiting.     Patient left the hospital Friday night, she felt pretty good. Yesterday woke up feeling very well. As the Bridget [name] went on patient became deteriorated. Tried ginger rale and ice pop but vomited 3 hours later. Later evening patient felt worst and vomited again last night and this morning.   In the ED, patient received multiple anti-emetic medications and seemed to improve with IV Zofran and IV Phenergan. She vomited x1 going to the bathroom.   Patient was previously hospitalized twice for this same issue pre-pandemic. And 3 times in 2021. "    Consultations:   Treatment Team:   Attending Provider: Federico Flake, MD  Resident: Asli Tokarski, Ames Coupe, MD  Resident: Jeral Pinch, MD    Hospital Course:   Bridget [name] is 25 year old female  with hx of asthma, Ehlers-Danlos, POTS, cyclic vomiting syndrome and possible gastroparesis, admitted with dehydration 2/2 intractable vomiting. Patient was give IV fluid as well and IV antiemetics including zofran, phenergan, and erythromycin. Patient was slowly transitioned from CLD to regular diet. Continued protonix 40 mg BID. Per patient's request, the team contacted the patients gastroenterologist, Dr. Marlyne Beards, at [facility] for further recommendations given he is familiar with her case. Dr. Marlyne Beards recommended transitioning patient from IV erythromycin to oral erythromycin prior to discharge. He then mentioned he would be able to see patient via telemedicine appointment within 1-2 days of discharge.Plan was to transition from IV anti-emetics were slowly transitioned from IV to po, however during the time at which patient was still requiring IV anti-emetics, both patient and mother decided that they wanted to leave AMA after patient tolerated one dose of oral erythromycin. After thorough discussion with the primary team and family, family chose to leave AMA on 8/2 around 2200.     While here, patient was mildly hypertensive, thus pyridostigmine 40 mg BID that she takes for POTS was held during her stay. Prior to discharge, recommended that he stop taking her pyridostigmine and follow with her cardiologist.      Patient was noted to have mildly elevated blood pressure while admitted. Thought to be due to nausea/vomiting episodes; however, she  remained slightly hypertensive throughout her stay. No intervention was done. Less likely benign essential HTN given age and BMI. For menstrual cramps, patient received tordaol 15 mg as needed with improvement in symptoms.     Patient will follow up with GI Specialist, Dr. Marlyne Beards, within 1-2 days of discharge.   Patient should follow up with PCP within 1 week of discharge.       Discharge Bridget [name] Exam:     VITAL SIGNS: PHYSICAL:   Temp:  [98.1 F (36.7 C)-99.1 F (37.3  C)] 98.1 F (36.7 C)  Heart Rate:  [25-101] 25  Resp Rate:  [16-18] 16  BP: (107-149)/(70-105) 116/75  Body mass index is 18.54 kg/m.   Physical Exam  Constitutional:       Appearance: Normal appearance.   HENT:      Head: Normocephalic and atraumatic.   Pulmonary:      Effort: Pulmonary effort is normal.   Neurological:      Cranial Nerves: No cranial nerve deficit.   Psychiatric:         Mood and Affect: Mood normal.         Speech: Speech normal.         Behavior: Behavior normal.         Thought Content: Thought content normal.        Recent Labs:       Recent Labs   Lab 03/16/21  0436 03/15/21  1003   WBC 8.05 7.94   Hgb 13.5 17.4*   Hematocrit 38.9 50.0*   Platelets 283 343           Recent Labs   Lab 03/15/21  1003 03/13/21  1254   Alkaline Phosphatase 78 79   Bilirubin, Total 1.3* 1.3*   Bilirubin Direct  --  0.4   Protein, Total 7.9 8.9*   Albumin 4.9 5.4*   ALT 13 16   AST (SGOT) 15 19      Recent Labs   Lab 03/17/21  0756 03/16/21  0436   Sodium 141 137   Potassium 3.6 3.5   Chloride 102 100   CO2 27 23   BUN 4* 5*   Glucose 97 82   Calcium 9.5 9.2   Magnesium 1.7  --    Phosphorus 3.7  --            Recent Labs   Lab 03/13/21  1254   TSH 0.75          Procedures/Radiology performed:     No results found.    Echo Results       None            Discharge Instructions & Follow Up Plan for Patient:     Diet: Diet regular    Activity/Weight Bearing: No restrictions    Code Status: Full Code    Instructed to follow up with:    Follow-up Information       Narda Amber, FNP Follow up.    Specialty: Family Nurse Practitioner  Contact information:  [address]  [place] [address]  [phone]               Jacqulyn Cane, MD Follow up.    Specialties: Gastroenterology, Internal Medicine  Contact information:  [address]  [place] [address]  [phone]  Discharge Medications:        Medication List        CONTINUE taking these medications       calcium carbonate 500 MG chewable tablet  Commonly known as: TUMS     norethindrone-ethinyl estradiol 1-35 MG-MCG per tablet  Commonly known as: ORTHO-NOVUM 1/35  Take 1 tablet by mouth daily     nortriptyline 50 MG capsule  Commonly known as: PAMELOR     ondansetron 4 MG disintegrating tablet  Commonly known as: ZOFRAN-ODT  Take 1 tablet (4 mg total) by mouth every 6 (six) hours as needed for Nausea     pantoprazole 40 MG tablet  Commonly known as: PROTONIX  Take 1 tablet (40 mg total) by mouth 2 (two) times daily     prochlorperazine 5 MG tablet  Commonly known as: COMPAZINE  Take 1 tablet (5 mg total) by mouth every 6 (six) hours as needed for Nausea     promethazine 25 MG suppository  Commonly known as: PHENERGAN  Place 1 suppository (25 mg total) rectally every 6 (six) hours as needed for Nausea     pyridostigmine 60 MG tablet  Commonly known as: MESTINON            Signed by: Gardiner Coins, MD  [facility]    Our team has spent more than 30 minutes today involving counseling or coordination of care related to the patient's discharge.    CC: Narda Amber, FNP

## 2021-03-17 NOTE — Plan of Care (Signed)
Bedside nurse called to inform team that patient is requesting discharge home.    Went to bedside to discuss with patient and mother (on the phone). Expressed concern with discharge tonight given that patient remains on IVF and has been getting all IV medications with the exception of one dose of erythromycin this afternoon. Recommended that patient stay overnight with discontinuation of fluids and transition to medications as she would get them at home to ensure she can tolerate doses and at least a small amount of breakfast tomorrow. Agreed that we will cancel labs and space out vital checks to promote improved sleep. Discussed with nursing staff patient's concern about not getting food or medication in time after onset of hunger which can cause vomiting; will do what they reasonably can to get her available food in a timely manner with understanding that options are limited overnight. Patient and mother aware that they can leave against medical advice if they are unwilling to stay the night.    Harlene Ramus, MD PGY3

## 2021-03-17 NOTE — Consults (Signed)
Nutrition Assessment    Bridget [name] 25 y.o. female   MRN: [id number]      Reason for Assessment: Consult for how to re-introduce food after episodes of vomiting    Nutrition Recommendations:   Encourage small frequent intake of regular diet (pt to self regulate and control including ordering PRN extra food products for snacks)    Encourage pt to avoid meal skipping pattern    Trend: % intake, wt and physical tolerance of diet    Marlis Edelson, MS, RD  Clinical Dietitian  Ext. [phone]   ________________________________________  Assessment Data:  Summary: 25 yo female with PMH notable for: asthma, Ehlers-Danlos, POTS, cyclic vomiting syndrome(marijuana use), who presented to ER with concerns of dehydration due to intractable vomiting.IN ER, IV protonix given, IV zofran alternated with phenergan and start of erythromycin along with IVF administration while being counseled by MD regarding marijuana cessation. Pt is noted in ER to have forgotten her Sunday dose of Nortriptyline and Protonix prior to a long road trip, nor did she have any food to ear or her phenergan with her, pt unable to control her nausea.  Pt went to ED in [place] and given IVF and protonix and flew back to [place] and presented to ER.  Pt admit for intractable N/V.  Pt restarted on amitriptyline yesterday and reports start of tolerance of oral intake.     Pt and her mother seen this AM.  Pt awake, alert and oriented.  Pt noting she no longer lives at home with parents and lives with her boyfriend.  Pt notes in the last 24 hours variable tolerance of food and yesterday has mixed results, tolerated mashed potatoes from her mother's home but later had episode of vomiting.  Pt now this morning notes slight presence of appetite, desire to order meals and re-try diet progression. Pt  further notes she has observed patterns and is aware she should not go long periods of time with out eating and needs to take her medication as prescribed. Pt  further reports she attempts to always have various food products available with her at all times to further enhance tolerance of diet and avoidance of nausea and vomiting cycles but that was not what happened on her road trip.    Pt encouraged to continue with small frequent intake, use of tolerated food snacks between meals and use clear liquids as a starting point of her diet progression and then continue to expand intake to solids but to have back up food products available in-case tolerance of diet is not established.   Pt encouraged to document her noticed food patterns and wt hx for upcoming GI consult as an outpatient to share with her GI team.  Also encouraged pt to order breakfast and to continue ordering extra food products to use as between meal food snacks.    Will trend: % intake, physical tolerance of diet and wt as LOS continues.     Adm dx:  Intractable nausea and vomiting   Patient Active Problem List   Diagnosis    Asthma    Delayed gastric emptying    Ehlers-Danlos syndrome    Endometriosis    Postural orthostatic tachycardia syndrome    ASCUS with positive high risk HPV cervical    Cyclic vomiting syndrome    Cardiac arrhythmia    Intractable vomiting    Intractable nausea and vomiting       PMH:  has a past medical history of Abdominal pain,  Asthma, Complication of anesthesia, Cyclical vomiting, Ehlers-Danlos disease, Gastroparesis, Post-operative nausea and vomiting, POTS (postural orthostatic tachycardia syndrome), Sinus trouble, UTI (urinary tract infection), and Yeast infection involving the vagina and surrounding area (05/09/2015).    Recent Labs   Lab 03/17/21  0756 03/16/21  0436 03/15/21  1003 03/13/21  1254   Sodium 141 137 139 136   Potassium 3.6 3.5 3.6 4.1   Chloride 102 100 95* 96*   CO2 27 23 24  15*   BUN 4* 5* 13 11   Creatinine 0.7 0.7 0.9 0.8   Glucose 97 82 106* 66*   Calcium 9.5 9.2 10.4 10.2   Magnesium 1.7  --   --   --    Phosphorus 3.7  --   --   --    EGFR >60.0 >60.0  >60.0 >60.0   WBC  --  8.05 7.94 8.51   Hematocrit  --  38.9 50.0* 49.4*   Hgb  --  13.5 17.4* 16.9*   Lipase  --   --  30 31   Bilirubin, Total  --   --  1.3* 1.3*   Bilirubin Direct  --   --   --  0.4   Bilirubin Indirect  --   --   --  0.9   AST (SGOT)  --   --  15 19   ALT  --   --  13 16   Alkaline Phosphatase  --   --  78 79       Recent Labs   Lab 03/15/21  1731 03/13/21  2204 03/13/21  1948   Whole Blood Glucose POCT 98 111* 103*       Current Facility-Administered Medications   Medication Dose Route Frequency    enoxaparin  40 mg Subcutaneous Daily    erythromycin  125 mg Intravenous Q8H    nortriptyline  50 mg Oral QHS    ondansetron  4 mg Intravenous Q6H    pantoprazole  40 mg Intravenous Daily    promethazine (PHENERGAN) IVPB  12.5 mg Intravenous 4 times per day      lactated ringers 90 mL/hr at 03/17/21 0622     PRN meds given in the past 48 hours: none administered    Social History: Resides with boyfriend    Intake History: Pt notes that since last admit she has found that having small frequent intake of foods/snacks and meals work for her.  Pt has multiple snacks typically in her backpack or at home to assist with preventing long periods of time of not eating.  Pt notes she dines out for breakfast and lunch ([employer] pastry and coffee or ice beverage for breakfast and chicken entree usually kabob with rice) with left over for dinner .    Orders Placed This Encounter      Diet regular    Orders Placed This Encounter   Procedures    Diet regular       Food intake:  To be established    Allergies   Allergen Reactions    Amitriptyline Hives     Patient stated that she received a dose at the hospital and a rash developed all over her face after she arrived home. Ok with nortriptyline.    Haloperidol      Interacts with reglan and also pt had involuntary eye movements.     Latex Itching     Itching irritation swelling condoms  Itching irritation swelling condoms  Itching irritation swelling  condoms     Reglan [Metoclopramide]     Morphine Rash       Nutrition Focused Physical Exam:  Head: no overt s/s subcutaneous muscle or fat loss  Upper Body - no overt s/s subcutaneous muscle or fat loss  Lower Body - no s/s subcutaneous fat or muscle loss  Edema -  None  Skin -  Intact but pale coloration  GI function -  Admit for intractable N/V    Abdominal Exam per RN:  Abdomen: soft and flat  Tenderness: non-tender  Bowel sounds: active  Date of last BM: 8/1 (per pt)    Learning Needs:  Needs positive feedback to continue her efforts of small frequent intake in all of her daily environments, continued medications as scheduled and prescribed while f/u with pending outpatient GI appointment    Anthropometrics  Height: 162.6 cm (5' 4.02")  Weight: 49 kg (108 lb 0.4 oz)  BMI (calculated): 18.6  EMR data    Wt Hx: Pt reporting usual wt of 110# this summer which would be a trending upward of wt values  Weight Monitoring Weight Weight Method BMI (calculated)   08/04/2020 44.4 kg Standing Scale    08/11/2020 44.453 kg  16.8 kg/m2   03/13/2021 49.4 kg     03/13/2021 48.988 kg  18.6 kg/m2   03/15/2021      03/17/2021 49 kg  18.6 kg/m2       Total Daily Energy Needs: 1225 to 1470 kcal  Method for Calculating Energy Needs: 25 kcal - 30 kcal per kg  at 49 kg (Actual body weight)       Total Daily Protein Needs: 39.2 to 53.9 g  Method for Calculating Protein Needs: 0.8 g - 1.1 g per kg at 49 kg (Actual body weight)       Total Daily Fluid Needs: 1225 to 1470 ml  Method for Calculating Fluid Needs: 25 ml - 30 ml  per kg at 49 kg (Actual body weight)       Nutrition Diagnosis:   Inadequate Energy Intake related to episodes of vomiting  as evidenced by diet hx statements    Intervention:  Goals:   Physical tolerance of diet  Prevent unplanned wt loss    Plan:  Encourage small frequent intake of regular diet (pt to self regulate and control including ordering PRN extra food products for snacks)    Encourage pt to avoid meal skipping  pattern    Trend: % intake, wt and physical tolerance of diet    Monitoring/Evaluation:   PO intake  Weights  GI symptoms    Marlis Edelson, MS, RD  Clinical Dietitian  Ext. [phone]

## 2021-03-17 NOTE — Progress Notes (Signed)
When RN first walked into the room pt and mother immediately conveyed their discpleasure in their treatment. They stated that they felt like they were being "held hostage" and "prescriptions were being withheld to keep them here". They were also upset that [facility] had changed scheduled nausea medication to PRN, right after having a conversation about needing it to stay scheduled. They were upset that changes like this weren't being conveyed properly to them. RN contacted [facility] and relayed message. [facility] informed RN that day shift had a conversation with them already about this but that they would come and see her when they had time after an admission. RN spoke with pt and they stated that they were planning on leaving AMA at 2100 after erythromycin is given.  [facility] came up and spoke with the pt about their concerns and about the possibility of signing themselves out AMA. [facility] told them at the time that if they left AMA that insurance would would not cover the visit. Pt agreed to stay overnight then called insurance to confirm this statement. Insurance stated that this was not true. Pt requested AMA form at 2200, RN notified [facility] that pt was leaving and pt left the unit at 2230.

## 2021-03-17 NOTE — Progress Notes (Signed)
Transfer Note-  Patient admitted to Medical Room #464 at 1125. Hand off report received from [place], [place].     Patient oriented to room, bed in lowest position, bed alarm activated, call bell within reach.   Fall prevention contract completed. Yes/No  Patient informed of visitor policy.  Belongings with mother.    Brief Clinical Picture:  Alert and oriented x4. Vital signs stable as charted, no complaints of nausea or pain at this time. Ambulatory from wheelchair to bed. Continued IVF as ordered. Oriented to room and call light. Call light and belongings within reach.     Skin Assessment  Two nurse skin assessment performed with: Rene Kocher, RN    Areas observed with redness or injury: None    Braden score <15. No

## 2021-03-17 NOTE — UM Notes (Addendum)
CONTINUED STAY REVIEW:       --------DAY 3:  03/17/21----------     Not stable for discharge after 2 nights in Observation d/t continued nausea and poor po intake. Continues on IVF at 90 ml/hr and scheduled Phenergan IV QID, protonix IV. Changed to Inpatient:      03/17/21 2202  Admit to Inpatient  Once        Comments:    Diagnosis: Intractable Nausea And Vomiting    Level of Care: Acute    Patient Class: Inpatient       References:    [facility] Bed Placement Criteria    [facility] Bed Placement Criteria    [facility] Bed Placement Criteria    [facility] Bed Placement Criteria    [facility] Bed Placement Criteria   Question Answer Comment   Admitting Physician CRUTCHFIELD, ANNA L    Service: Medicine    Estimated Length of Stay > or = to 2 midnights    Tentative Discharge Plan? Home or Self Care    Does patient need telemetry? Yes    Telemetry type (separate Telemetry order is also required): Cardiac telemetry                Vitals:    03/17/21 0539 03/17/21 0803 03/17/21 0804 03/17/21 1000   BP: 110/71 116/75     Pulse: 91 (!) 25 (!) 125    Resp:  16     Temp:  98.1 F (36.7 C)     TempSrc:  Oral     SpO2:  100%     Weight:    49 kg (108 lb 0.4 oz)   Height:    1.626 m (5' 4.02")     Vitals:    03/17/21 0804 03/17/21 1000 03/17/21 1142 03/17/21 1700   BP:   126/76 128/79   Pulse: (!) 125  (!) 121 (!) 102   Resp:   18 15   Temp:   98.2 F (36.8 C) 98.1 F (36.7 C)   TempSrc:   Oral Oral   SpO2:   97% 96%   Weight:  49 kg (108 lb 0.4 oz)     Height:  1.626 m (5' 4.02")         PERSISTENT TACHYCARDIA TODAY.       MEDICATIONS:  Current Facility-Administered Medications   Medication Dose Route Frequency    enoxaparin  40 mg Subcutaneous Daily    erythromycin ethylsuccinate  400 mg Oral Q8H    ketorolac  15 mg Intravenous Once    nortriptyline  50 mg Oral QHS    ondansetron  4 mg Intravenous Q6H    pantoprazole  40 mg Intravenous BID    promethazine (PHENERGAN) IVPB  12.5 mg Intravenous 4 times per day      lactated ringers 90 mL/hr at 03/17/21  0622         PER MEDICINE:  Patient endorses some nausea and 2-3 episodes of non bloody, vomiting overnight.  She continues to endorse some nausea today.   #Intractable nausea/vomiting  Hx of multiple ED visits and past admission for this issue. Following by GI. Working ddx: gastroparesis iso medication noncompliance vs marijuana overuse   EKG 441      - Transition from V protonix 40 mg daily to BID  - Compazine 5mg  IV or PO Q6h PRN  - Zofran 4mg  IV Q6h scheduled, alternated with Phenergan    - Phenergan 12.5 mg IV Q6h scheduled  - Erythromycin 125 mg Q8h  scheduled; plan to transition to oral erythromycin 400 mg q 8 hours today per pt's GI (Dr Marlyne Beards) recommendations  - Spoke with Dr. Marlyne Beards who notes he will be able to follow up with patient either 1-2 days after discharge to help ensure she is on a good regimen. He recommended getting her on oral erythromycin prior to discharge. He will be able to intitiate amitriptyline in the future.   - Continue maintenance IVF with LR @ 90cc/hr   - Avoid Reglan and Haldol as pt reports adverse reactions to these in the past  - Counsel on marijuana cessation      #Mildly elevated blood pressure  - Systolic BP in the 110-150's.   - May be related to nausea/vomiting   - Less likely benign essential HTN given age and BMI   - No acute intervention at this time    - Continue to monitor BP       #Menstrual cramps   -Contributes to her nausea and vomiting   -Toradol 15 mg x 1 on 8/1 with some improvement.  -Will give another 15 mg x 1 today.       #FEN/GI - LR 90 cc/hr, continue to advance diet as tolerated       PER NUTRITION:  Anthropometrics  Height: 162.6 cm (5' 4.02")  Weight: 49 kg (108 lb 0.4 oz)  BMI (calculated): 18.6  EMR data   Wt Hx: Pt reporting usual wt of 110# this summer which would be a trending upward of wt values      Not stable for discharge on day 3 after two nights in Observation.  Persistent nausea, persistent tachycardia, continues on IVF at 90 ml/hr.  Changed to Inpatient.     _____________________________________________    ** This clinical review is compiled from documentation provided by the treatment team in the medical record. **  _____________________________________________    Lodema Pilot, RN, BSN, ACM-RN  Utilization Review Nurse II  [facility]  [address]  [address]  [place], [place] [address]  Phone: [phone]  Main Office Phone: [phone]  Fax: [phone]  Tax ID:  [id number]  NPI:  [id number]    ------------------------------------------------------------------------

## 2021-03-17 NOTE — Progress Notes (Signed)
[facility] Progress Note  Physician Available 24 hours a Ma Munoz:  Please call number in sticky note to reach hospital team.  Office#: [phone]     Date Time: 03/17/21 7:05 AM  Patient Name: Bridget [name]  Attending Physician: Federico Flake, MD  Primary Care Physician: Narda Amber, FNP    CC: Intractable nausea and vomiting    Assessment:   Principal Problem:    Intractable nausea and vomiting  Resolved Problems:    * No resolved hospital problems. *      25 y.o. female with known history of asthma, Ehlers-Danlos, POTS, cyclic vomiting syndrome and possible gastroparesis, admitted with dehydration 2/2 intractable vomiting. Patient is receiving IV fluids with IV anti-nausea medications at this time with plan to transition to po over the next 24 hours.      Plan:   #Intractable nausea/vomiting  Hx of multiple ED visits and past admission for this issue. Following by GI. Working ddx: gastroparesis iso medication noncompliance vs marijuana overuse   EKG 441      - Transition from V protonix 40 mg daily to BID  - Compazine 5mg  IV or PO Q6h PRN  - Zofran 4mg  IV Q6h scheduled, alternated with Phenergan    - Phenergan 12.5 mg IV Q6h scheduled  - Erythromycin 125 mg Q8h scheduled; plan to transition to oral erythromycin 400 mg q 8 hours today per pt's GI (Dr Marlyne Beards) recommendations  - Spoke with Dr. Marlyne Beards who notes he will be able to follow up with patient either 1-2 days after discharge to help ensure she is on a good regimen. He recommended getting her on oral erythromycin prior to discharge. He will be able to intitiate amitriptyline in the future.   - Continue maintenance IVF with LR @ 90cc/hr   - Nutrition consulted, recommending small frequent meals, avoid meal skipping, and trend weight; % intake.   - Avoid Reglan and Haldol as pt reports adverse reactions to these in the past  - Counsel on marijuana cessation     #Mildly elevated blood pressure  - Systolic BP in the 110-150's.    - May be related to nausea/vomiting   - Less likely benign essential HTN given age and BMI   - No acute intervention at this time    - Continue to monitor BP      #POTS  - Stable, asymptomatic  - Pt followed by cardiology Dr. Cleotis Lema  - Continue to hold home Pyridostigmine 40 mg BID until tolerate PO; this may be contributing to nausea and vomiting. Patient has continue to take 40 mg BID, rather than increasing frequency which her cardiologist recommended at their last appointment.   - Patient will need to follow up with cardiology outpatient   - Continue to monitor BP and HR     #Menstrual cramps   -Contributes to her nausea and vomiting   -Toradol 15 mg x 1 on 8/1 with some improvement.  -Will give another 15 mg x 1 today.      #FEN/GI - LR 90 cc/hr, continue to advance diet as tolerated      #PPX  - DVT - Lovenox   - GI - (see protonix above)      CODE STATUS: Full    DISPO:  Today's date: 03/17/2021  Admit Date: 03/15/2021  9:38 AM  Anticipated medical stability for discharge:Yellow - maybe tomorrow  Service status: Observation: Due to anticipation of improvement of symptoms.   Anticipated discharge needs:  improvement in symptoms, tolerating po     Subjective:     Interval History/HPI:     Patient endorses some nausea and 2-3 episodes of non bloody, vomiting overnight. She was able to tolerate mash potatoes yesterday with chex mix and fluids. She continues to endorse some nausea today. Notes improvement in abdominal pain with Toradol yesterday, would like another dose. Thinks she would be willing to try transitioning to oral erythromycin this afternoon.     Review of Systems:     Denies fever, sweats, chills, chest pain, dyspnea,     Physical Exam:     VITAL SIGNS: Physical Exam:   Temp:  [98.2 F (36.8 C)-99.1 F (37.3 C)] 98.6 F (37 C)  Heart Rate:  [86-102] 91  Resp Rate:  [17-18] 17  BP: (107-149)/(70-105) 110/71    No intake or output data in the 24 hours ending 03/17/21 0705   GEN: awake, alert and  oriented x 3  HEENT: MMM  CARDS: regular rate and rhythm, no m/r/g  LUNGS: clear to auscultation bilaterally, without wheezing, rhonchi, or rales  ABD: soft, epigastric tenderness, non-distended; no palpable masses,  normoactive BS  EXT: no edema         Current Meds:     Current Facility-Administered Medications   Medication Dose Route Frequency    enoxaparin  40 mg Subcutaneous Daily    erythromycin  125 mg Intravenous Q8H    nortriptyline  50 mg Oral QHS    ondansetron  4 mg Intravenous Q6H    pantoprazole  40 mg Intravenous Daily    promethazine (PHENERGAN) IVPB  12.5 mg Intravenous 4 times per Toyia Jelinek     Current Facility-Administered Medications   Medication Dose Route Frequency Last Rate    lactated ringers   Intravenous Continuous 90 mL/hr at 03/17/21 1610     Current Facility-Administered Medications   Medication Dose Route    glucagon (rDNA)  1 mg Intramuscular    And    dextrose  250 mL Intravenous    And    dextrose  25 g Intravenous    And    dextrose  25 g Intravenous    magnesium sulfate  1 g Intravenous    melatonin  3 mg Oral    naloxone  0.2 mg Intravenous    potassium & sodium phosphates  2 packet Oral    potassium chloride  0-40 mEq Oral    And    potassium chloride  10 mEq Intravenous    prochlorperazine  5 mg Intravenous     Labs:     Labs (last 72 hours):    Recent Labs   Lab 03/16/21  0436 03/15/21  1003   WBC 8.05 7.94   Hgb 13.5 17.4*   Hematocrit 38.9 50.0*   Platelets 283 343            Recent Labs   Lab 03/16/21  0436 03/15/21  1003 03/13/21  1254   Sodium 137 139 136   Potassium 3.5 3.6 4.1   Chloride 100 95* 96*   CO2 23 24 15*   BUN 5* 13 11   Creatinine 0.7 0.9 0.8   Calcium 9.2 10.4 10.2   Albumin  --  4.9 5.4*   Protein, Total  --  7.9 8.9*   Bilirubin, Total  --  1.3* 1.3*   Alkaline Phosphatase  --  78 79   ALT  --  13 16   AST (SGOT)  --  15 19   Glucose 82 106* 66*            Imaging (last 24 hours), reviewed and are significant for:  No results found.    Signed by: Ames Coupe Allante Beane,  MD   Office#: [phone]     UJ:[id number], Octaviano Batty, FNP

## 2021-03-17 NOTE — Nursing Progress Note (Signed)
TRANSFER NOTE  At 1110 - clin tech discontinued telemetry equipment; cleaned, returned to monitor tech station, and monitor tech notified of transfer status   At 1118 - this RN called Chloe RN [phone] and gave her report over the phone.   At 1122 - clin tech and Multimedia programmer transferred patient to room 464 via wheelchair.  Patient's mother with patient during transfer; all personal items with patient.

## 2021-03-17 NOTE — Plan of Care (Signed)
Problem: Moderate/High Fall Risk Score >5  Goal: Patient will remain free of falls  Outcome: Progressing  Flowsheets (Taken 03/17/2021 1130)  Moderate Risk (6-13):   LOW-Fall Interventions Appropriate for Low Fall Risk   LOW-Anticoagulation education for injury risk  Note: Self care, steady gait, no assistive devices       Problem: Safety  Goal: Patient will be free from injury during hospitalization  Outcome: Progressing  Flowsheets (Taken 03/15/2021 2026 by Donnetta Hutching, RN)  Patient will be free from injury during hospitalization:   Assess patient's risk for falls and implement fall prevention plan of care per policy   Provide and maintain safe environment   Use appropriate transfer methods   Ensure appropriate safety devices are available at the bedside   Hourly rounding  Note: Self care/independent, encouraged to call for assistance as needed  Goal: Patient will be free from infection during hospitalization  Outcome: Progressing  Flowsheets (Taken 03/17/2021 1130)  Free from Infection during hospitalization:   Assess and monitor for signs and symptoms of infection   Monitor lab/diagnostic results   Encourage patient and family to use good hand hygiene technique     Problem: Pain  Goal: Pain at adequate level as identified by patient  Outcome: Progressing  Flowsheets (Taken 03/17/2021 1130)  Pain at adequate level as identified by patient:   Assess pain on admission, during daily assessment and/or before any "as needed" intervention(s)   Reassess pain within 30-60 minutes of any procedure/intervention, per Pain Assessment, Intervention, Reassessment (AIR) Cycle

## 2021-03-19 NOTE — UM Notes (Addendum)
PLEASE CANCEL THE INPATIENT AUTHORIZATION REQUEST.   PT LEFT THE HOSPITAL AMA ON 03/17/21.   STATUS CHANGED BACK TO OBSERVATION.    THANK YOU.          ___________________________________________                Lodema Pilot RN, BSN, ACM-RN  Utilization Review Nurse II  [facility]  [address]  [address]  [place], [place] [address]  Phone: [phone]  Main Office Phone: [phone]  Fax: [phone]  Tax ID:  [id number]  NPI:  [id number]

## 2021-03-23 ENCOUNTER — Encounter (INDEPENDENT_AMBULATORY_CARE_PROVIDER_SITE_OTHER): Payer: Self-pay

## 2021-03-24 ENCOUNTER — Encounter (INDEPENDENT_AMBULATORY_CARE_PROVIDER_SITE_OTHER): Payer: Self-pay

## 2021-04-16 ENCOUNTER — Other Ambulatory Visit (INDEPENDENT_AMBULATORY_CARE_PROVIDER_SITE_OTHER): Payer: Self-pay | Admitting: Family Medicine

## 2021-04-23 ENCOUNTER — Encounter (INDEPENDENT_AMBULATORY_CARE_PROVIDER_SITE_OTHER): Payer: Self-pay

## 2021-04-24 ENCOUNTER — Encounter (INDEPENDENT_AMBULATORY_CARE_PROVIDER_SITE_OTHER): Payer: Self-pay

## 2021-05-23 ENCOUNTER — Encounter (INDEPENDENT_AMBULATORY_CARE_PROVIDER_SITE_OTHER): Payer: Self-pay

## 2021-05-24 ENCOUNTER — Encounter (INDEPENDENT_AMBULATORY_CARE_PROVIDER_SITE_OTHER): Payer: Self-pay

## 2021-06-23 ENCOUNTER — Encounter (INDEPENDENT_AMBULATORY_CARE_PROVIDER_SITE_OTHER): Payer: Self-pay

## 2021-06-24 ENCOUNTER — Encounter (INDEPENDENT_AMBULATORY_CARE_PROVIDER_SITE_OTHER): Payer: Self-pay

## 2021-07-18 DIAGNOSIS — U071 COVID-19: Secondary | ICD-10-CM | POA: Insufficient documentation

## 2021-07-18 NOTE — ED Triage Notes (Signed)
Pt covid+. Pt having vomiting starting today 1800. Also RUQ abdominal pain.

## 2021-07-18 NOTE — EDIE (Signed)
COLLECTIVE?NOTIFICATION?07/18/2021 23:14?ANIYLAH, AVANS A?MRN: [id number]    Criteria Met      5 ED Visits in 12 Months    Security and Safety  No Security Events were found.  ED Care Guidelines  There are currently no ED Care Guidelines for this patient. Please check your facility's medical records system.        Prescription Monitoring Program  000??- Narcotic Use Score  000??- Sedative Use Score  000??- Stimulant Use Score  000??- Overdose Risk Score  - All Scores range from 000-999 with 75% of the population scoring < 200 and on 1% scoring above 650  - The last digit of the narcotic, sedative, and stimulant score indicates the number of active prescriptions of that type  - Higher Use scores correlate with increased prescribers, pharmacies, mg equiv, and overlapping prescriptions  - Higher Overdose Risk Scores correlate with increased risk of unintentional overdose death   Concerning or unexpectedly high scores should prompt a review of the PMP record; this does not constitute checking PMP for prescribing purposes.    E.D. Visit Count (12 mo.)  Facility Visits   [facility] 1   [facility] 7   Total 8   Note: Visits indicate total known visits.     Recent Emergency Department Visit Summary  Date Facility [facility] Type Diagnoses or Chief Complaint    Jul 18, 2021  [facility] [place] [facility]  [place]  Emergency      vomitting      Mar 15, 2021  [facility] [place] [facility]  [facility]  [place]  Emergency      Vomitting, fatigue/ low blood sugar      Emesis      Generalized weakness      Nausea with vomiting, unspecified      Mar 13, 2021  [facility] [place] [facility]  [place]  [place]  Emergency      palpitations; Dizzy;      Palpitations      Vomiting, unspecified      Hypoglycemia, unspecified      Acidosis      Dehydration      Gastroparesis      Mar 11, 2021  [facility]  [place]  Emergency      Allergy status to other drugs, medicaments and biological substances      Ehlers-Danlos syndrome, unspecified      Vomiting, unspecified       Dehydration      Endometriosis, unspecified      Other long term (current) drug therapy      Other specified cardiac arrhythmias      Aug 04, 2020  [facility] [place] [facility]  [place]  Emergency      Emesis      Right upper quadrant pain      Dehydration      Tachycardia, unspecified      Aug 01, 2020  [facility] [place] [facility]  [facility]  [place]  Emergency      Emesis      Nausea with vomiting, unspecified      Cyclical vomiting syndrome unrelated to migraine      Jul 31, 2020  [facility] [place] [facility]  [facility]  [place]  Emergency      Emesis; Nausea      Emesis      Nausea with vomiting, unspecified      Jul 30, 2020  [facility] [place] [facility]  [facility]  [place]  Emergency      Nausea; Vomitting      Emesis      Cyclical vomiting syndrome unrelated to migraine      Dehydration        Recent Inpatient Visit Summary  Date Facility [facility] Type Diagnoses or Chief Complaint    Mar 15, 2021  [facility] [place] [facility]  [place]  [place]  Medical Surgical      Cyclical vomiting syndrome unrelated to migraine      Nausea with vomiting, unspecified      Functional dyspepsia      Vomiting, unspecified        Care Team  Provider Specialty Phone Fax Service Dates   Wynne Dust, MD Pediatrics [phone] Current      Collective Portal  This patient has registered at the [facility] Emergency Department   For more information visit: [email or web address]     PLEASE NOTE:     1.   Any care recommendations and other clinical information are provided as guidelines or for historical purposes only, and providers should exercise their own clinical judgment when providing care.    2.   You may only use this information for purposes of treatment, payment or health care operations activities, and subject to the limitations of applicable Collective Policies.    3.   You should consult directly with the organization that provided a care guideline or other clinical history with any  questions about additional information or accuracy or completeness of information provided.    ? 2022 [place], [employer]. - [email or web address]

## 2021-07-19 ENCOUNTER — Emergency Department
Admission: EM | Admit: 2021-07-19 | Discharge: 2021-07-19 | Disposition: A | Discharge status: Home / Self Care | Payer: No Typology Code available for payment source | Attending: Emergency Medicine | Admitting: Emergency Medicine

## 2021-07-19 DIAGNOSIS — R112 Nausea with vomiting, unspecified: Secondary | ICD-10-CM

## 2021-07-19 DIAGNOSIS — U071 COVID-19: Secondary | ICD-10-CM

## 2021-07-19 LAB — CBC AND DIFFERENTIAL
Absolute NRBC: 0 10*3/uL (ref 0.00–0.00)
Basophils Absolute Automated: 0.03 10*3/uL (ref 0.00–0.08)
Basophils Automated: 0.4 %
Eosinophils Absolute Automated: 0 10*3/uL (ref 0.00–0.44)
Eosinophils Automated: 0 %
Hematocrit: 39.1 % (ref 34.7–43.7)
Hgb: 13 g/dL (ref 11.4–14.8)
Immature Granulocytes Absolute: 0.02 10*3/uL (ref 0.00–0.07)
Immature Granulocytes: 0.3 %
Lymphocytes Absolute Automated: 0.31 10*3/uL — ABNORMAL LOW (ref 0.42–3.22)
Lymphocytes Automated: 4.3 %
MCH: 29.4 pg (ref 25.1–33.5)
MCHC: 33.2 g/dL (ref 31.5–35.8)
MCV: 88.5 fL (ref 78.0–96.0)
MPV: 9.2 fL (ref 8.9–12.5)
Monocytes Absolute Automated: 0.45 10*3/uL (ref 0.21–0.85)
Monocytes: 6.2 %
Neutrophils Absolute: 6.42 10*3/uL — ABNORMAL HIGH (ref 1.10–6.33)
Neutrophils: 88.8 %
Nucleated RBC: 0 /100 WBC (ref 0.0–0.0)
Platelets: 238 10*3/uL (ref 142–346)
RBC: 4.42 10*6/uL (ref 3.90–5.10)
RDW: 13 % (ref 11–15)
WBC: 7.23 10*3/uL (ref 3.10–9.50)

## 2021-07-19 LAB — URINALYSIS REFLEX TO MICROSCOPIC EXAM - REFLEX TO CULTURE
Bilirubin, UA: NEGATIVE
Blood, UA: NEGATIVE
Glucose, UA: NEGATIVE
Ketones UA: 20 — AB
Leukocyte Esterase, UA: NEGATIVE
Nitrite, UA: NEGATIVE
Protein, UR: NEGATIVE
Specific Gravity UA: 1.02 (ref 1.001–1.035)
Urine pH: 6 (ref 5.0–8.0)
Urobilinogen, UA: NORMAL mg/dL (ref 0.2–2.0)

## 2021-07-19 LAB — LIPASE: Lipase: 14 U/L (ref 8–78)

## 2021-07-19 LAB — COMPREHENSIVE METABOLIC PANEL
ALT: 12 U/L (ref 0–55)
AST (SGOT): 17 U/L (ref 5–41)
Albumin/Globulin Ratio: 1.5 (ref 0.9–2.2)
Albumin: 4.1 g/dL (ref 3.5–5.0)
Alkaline Phosphatase: 55 U/L (ref 37–117)
Anion Gap: 10 (ref 5.0–15.0)
BUN: 7 mg/dL (ref 7.0–21.0)
Bilirubin, Total: 0.3 mg/dL (ref 0.2–1.2)
CO2: 22 mEq/L (ref 17–29)
Calcium: 9.2 mg/dL (ref 8.5–10.5)
Chloride: 106 mEq/L (ref 99–111)
Creatinine: 0.7 mg/dL (ref 0.4–1.0)
Globulin: 2.7 g/dL (ref 2.0–3.6)
Glucose: 106 mg/dL — ABNORMAL HIGH (ref 70–100)
Potassium: 4.1 mEq/L (ref 3.5–5.3)
Protein, Total: 6.8 g/dL (ref 6.0–8.3)
Sodium: 138 mEq/L (ref 135–145)

## 2021-07-19 LAB — HCG, SERUM, QUALITATIVE: Hcg Qualitative: NEGATIVE

## 2021-07-19 LAB — GFR: EGFR: >60

## 2021-07-19 MED ORDER — DIPHENHYDRAMINE HCL 50 MG/ML IJ SOLN
25.0000 mg | Freq: Once | INTRAMUSCULAR | Status: AC
Start: 2021-07-19 — End: 2021-07-19
  Administered 2021-07-19: 25 mg via INTRAVENOUS
  Filled 2021-07-19: qty 1

## 2021-07-19 MED ORDER — PROCHLORPERAZINE EDISYLATE 10 MG/2ML IJ SOLN
10.0000 mg | Freq: Once | INTRAMUSCULAR | Status: AC
Start: 2021-07-19 — End: 2021-07-19
  Administered 2021-07-19: 10 mg via INTRAVENOUS
  Filled 2021-07-19: qty 2

## 2021-07-19 MED ORDER — PROMETHAZINE HCL 25 MG RE SUPP
25.0000 mg | Freq: Four times a day (QID) | RECTAL | 0 refills | Status: AC | PRN
Start: 2021-07-19 — End: ?

## 2021-07-19 MED ORDER — SODIUM CHLORIDE 0.9 % IV BOLUS
1000.0000 mL | Freq: Once | INTRAVENOUS | Status: AC
Start: 2021-07-19 — End: 2021-07-19
  Administered 2021-07-19: 1000 mL via INTRAVENOUS

## 2021-07-19 NOTE — ED Provider Notes (Signed)
History     Chief Complaint   Patient presents with   . Abdominal Pain   . covid positive   . Emesis     25yo female h/o POTSD. EDS c/o   + covid       Past Medical History:   Diagnosis Date   . Abdominal pain    . Asthma     mild seasonal controlled   . Complication of anesthesia    . Cyclical vomiting    . Ehlers-Danlos disease    . Gastroparesis    . Post-operative nausea and vomiting    . POTS (postural orthostatic tachycardia syndrome)     [facility] [place] y[place]   . Sinus trouble     seasonal   . UTI (urinary tract infection)    . Yeast infection involving the vagina and surrounding area 05/09/2015       Past Surgical History:   Procedure Laterality Date   . ADENOIDECTOMY     . EGD N/A 01/23/2018    Procedure: EGD;  Surgeon: Jacqulyn Cane, MD;  Location[facility];  Service: Gastroenterology;  Laterality: N/A;  EGD  Q1=N/A   . TONSILLECTOMY     . WISDOM TOOTH EXTRACTION         Family History   Problem Relation Age of Onset   . Cancer Mother    . Ehlers-Danlos syndrome Sister    . Asthma Brother        Social  Social History     Tobacco Use   . Smoking status: Never   . Smokeless tobacco: Never   . Tobacco comments:     the patient vapes nicotine    Vaping Use   . Vaping Use: Some days   . Start date: 04/14/2018   Substance Use Topics   . Alcohol use: Not Currently   . Drug use: Yes     Types: Marijuana     Comment: Pt. uses marijuana every day.       .     Allergies   Allergen Reactions   . Amitriptyline Hives     Patient stated that she received a dose at the hospital and a rash developed all over her face after she arrived home. Ok with nortriptyline.   . Haloperidol      Interacts with reglan and also pt had involuntary eye movements.    . Latex Itching     Itching irritation swelling condoms  Itching irritation swelling condoms  Itching irritation swelling condoms   . Reglan [Metoclopramide]    . Morphine Rash       Home Medications       Med List  Status: In Progress Set By: Mariella Saa, RN at 07/19/2021 12:46 AM              calcium carbonate (TUMS) 500 MG chewable tablet     Chew 1 tablet by mouth daily as needed for Heartburn     norethindrone-ethinyl estradiol (ORTHO-NOVUM 1/35) 1-35 MG-MCG per tablet     Take 1 tablet by mouth daily     nortriptyline (PAMELOR) 50 MG capsule     Take 50 mg by mouth nightly     ondansetron (ZOFRAN-ODT) 4 MG disintegrating tablet     Take 1 tablet (4 mg total) by mouth every 6 (six) hours as needed for Nausea     pantoprazole (PROTONIX) 40 MG tablet     Take 1 tablet (  40 mg total) by mouth 2 (two) times daily     prochlorperazine (COMPAZINE) 5 MG tablet     Take 1 tablet (5 mg total) by mouth every 6 (six) hours as needed for Nausea     promethazine (PHENERGAN) 25 MG suppository     Place 1 suppository (25 mg total) rectally every 6 (six) hours as needed for Nausea     pyridostigmine (MESTINON) 60 MG tablet     Take 60 mg by mouth 2 (two) times daily             Review of Systems    Physical Exam    BP: (!) 131/91, Heart Rate: (!) 131, Temp: 98.2 F (36.8 C), Resp Rate: 18, SpO2: 97 %, Weight: 49.3 kg    Physical Exam      MDM and ED Course     ED Medication Orders (From admission, onward)      Start Ordered     Status Ordering Provider    07/19/21 0211 07/19/21 0210  prochlorperazine (COMPAZINE) injection 10 mg  Once        Route: Intravenous  Ordered Dose: 10 mg     Last MAR action: Given Eswin Worrell K    07/19/21 0211 07/19/21 0210  sodium chloride 0.9 % bolus 1,000 mL  Once        Route: Intravenous  Ordered Dose: 1,000 mL     Last MAR action: New Bag Deniese Oberry K    07/19/21 0211 07/19/21 0210  diphenhydrAMINE (BENADRYL) injection 25 mg  Once        Route: Intravenous  Ordered Dose: 25 mg     Last MAR action: Given Kelvin Burpee K               MDM                   Procedures    Clinical Impression & Disposition     Clinical Impression  Final diagnoses:   None        ED Disposition       None              New Prescriptions    No medications on file

## 2021-07-19 NOTE — ED Triage Notes (Signed)
Pt c/o sore throat, muscle aches, cough, abd pain, n/v  thst began this morning, pt reports taking an at home covid test which was +, hx of emesis and abd pain

## 2021-07-19 NOTE — Discharge Instructions (Signed)
YOU SHOULD SEEK MEDICAL ATTENTION IMMEDIATELY, EITHER HERE OR AT THE NEAREST EMERGENCY DEPARTMENT, IF ANY OF THE FOLLOWING OCCURS:  You can't stop vomiting or your vomiting doesn't get better with medication.  You cannot keep liquids down.  You have severe sudden chest or belly pain after vomiting.  You have abdominal pain.

## 2021-07-20 ENCOUNTER — Telehealth (INDEPENDENT_AMBULATORY_CARE_PROVIDER_SITE_OTHER): Payer: Self-pay

## 2021-07-20 NOTE — Telephone Encounter (Signed)
Called and spoke to pt.  C/o sinus pressure behind eyes, forehead and cheekbones, coughing, congestion, slight ST.  Also c/o red and swollen R eye and eyelid. Feels like there is something in her eye when she blinks.  Positive COVID 07/18/21.  Pt is taking sudafed.  RN suggested Mucinex, and Tylenol. RN emphasized looking at the back of each medication box and not double dosing on any one active ingredient. Also suggested using nettipot and humidifier and nasal spray. Also suggested using warm compress over eye to manage swelling and educated about styes.  RN emphasized that pt should seek care at [facility] or ER if feeling SOB or chest pains, even with COVID.  Pt verbalized thanks and understanding.

## 2021-07-20 NOTE — Telephone Encounter (Signed)
Pt called to try and schedule VV for today, PAA informed pt that we are completely booked. Pt is experiencing swelling in the right eye that started last night, as well as a sinus infection that has been building up. Pt also states that she tested positive covid this past Saturday, 12/3.     Pt requests cb to discuss further at [phone].     Please advise.

## 2021-07-23 ENCOUNTER — Encounter (INDEPENDENT_AMBULATORY_CARE_PROVIDER_SITE_OTHER): Payer: Self-pay

## 2021-07-24 ENCOUNTER — Encounter (INDEPENDENT_AMBULATORY_CARE_PROVIDER_SITE_OTHER): Payer: Self-pay

## 2021-08-23 ENCOUNTER — Encounter (INDEPENDENT_AMBULATORY_CARE_PROVIDER_SITE_OTHER): Payer: Self-pay

## 2021-08-24 ENCOUNTER — Encounter (INDEPENDENT_AMBULATORY_CARE_PROVIDER_SITE_OTHER): Payer: Self-pay

## 2021-09-23 ENCOUNTER — Encounter (INDEPENDENT_AMBULATORY_CARE_PROVIDER_SITE_OTHER): Payer: Self-pay

## 2021-09-24 ENCOUNTER — Encounter (INDEPENDENT_AMBULATORY_CARE_PROVIDER_SITE_OTHER): Payer: Self-pay

## 2021-10-21 ENCOUNTER — Encounter (INDEPENDENT_AMBULATORY_CARE_PROVIDER_SITE_OTHER): Payer: Self-pay

## 2021-10-29 ENCOUNTER — Encounter (INDEPENDENT_AMBULATORY_CARE_PROVIDER_SITE_OTHER): Payer: Self-pay

## 2021-11-21 ENCOUNTER — Encounter (INDEPENDENT_AMBULATORY_CARE_PROVIDER_SITE_OTHER): Payer: Self-pay

## 2021-11-22 ENCOUNTER — Encounter (INDEPENDENT_AMBULATORY_CARE_PROVIDER_SITE_OTHER): Payer: Self-pay

## 2021-12-10 ENCOUNTER — Encounter (INDEPENDENT_AMBULATORY_CARE_PROVIDER_SITE_OTHER): Payer: Self-pay | Admitting: Family Medicine

## 2021-12-10 ENCOUNTER — Ambulatory Visit (INDEPENDENT_AMBULATORY_CARE_PROVIDER_SITE_OTHER): Payer: No Typology Code available for payment source | Admitting: Family Medicine

## 2021-12-10 VITALS — BP 116/80 | HR 104 | Temp 98.8°F | Resp 16 | Ht 64.0 in | Wt 108.8 lb

## 2021-12-10 DIAGNOSIS — W5501XA Bitten by cat, initial encounter: Secondary | ICD-10-CM

## 2021-12-10 DIAGNOSIS — S51851A Open bite of right forearm, initial encounter: Secondary | ICD-10-CM

## 2021-12-10 MED ORDER — AMOXICILLIN-POT CLAVULANATE 875-125 MG PO TABS
1.0000 | ORAL_TABLET | Freq: Two times a day (BID) | ORAL | 0 refills | Status: AC
Start: 2021-12-10 — End: 2021-12-17

## 2021-12-10 NOTE — Progress Notes (Signed)
Subjective:      Patient ID: Bridget [name] is a 26 y.o. female.    Chief Complaint:  Chief Complaint   Patient presents with    Animal Bite     Cat bite right arm     HPI:  Cat bite on right forearm this afternoon. Pt washed area with soap.  Bridget [name] is fully vaccinated and its an indoor cat.  Last TDAP 02/2020.            Problem List:  Patient Active Problem List   Diagnosis    Asthma    Delayed gastric emptying    Ehlers-Danlos syndrome    Endometriosis    Postural orthostatic tachycardia syndrome    ASCUS with positive high risk HPV cervical    Cyclic vomiting syndrome    Cardiac arrhythmia    Intractable vomiting    Intractable nausea and vomiting       Current Medications:  Current Outpatient Medications   Medication Sig Dispense Refill    amoxicillin-clavulanate (Augmentin) 875-125 MG per tablet Take 1 tablet by mouth 2 (two) times daily for 7 days 14 tablet 0    calcium carbonate (TUMS) 500 MG chewable tablet Chew 1 tablet by mouth daily as needed for Heartburn      ciclopirox (LOPROX) 0.77 % cream Apply TO TO THE AFFECTED AREA TWICE DAILY FOR UP TO 3 TO 4 WEEKS AT A TIME      erythromycin base (E-MYCIN) 250 MG tablet TAKE ONE TABLET BY MOUTH WITH MEALS 3 TIMES A DAY FOR 28 DAYS, TAKE 15 MINUTES BEFORE MEALS      norethindrone-ethinyl estradiol (ORTHO-NOVUM 1/35) 1-35 MG-MCG per tablet Take 1 tablet by mouth daily 28 tablet 0    nortriptyline (PAMELOR) 50 MG capsule Take 50 mg by mouth nightly      ondansetron (ZOFRAN-ODT) 4 MG disintegrating tablet Take 1 tablet (4 mg total) by mouth every 6 (six) hours as needed for Nausea 12 tablet 0    pantoprazole (PROTONIX) 40 MG tablet Take 1 tablet (40 mg total) by mouth 2 (two) times daily 60 tablet 0    prochlorperazine (COMPAZINE) 5 MG tablet Take 1 tablet (5 mg total) by mouth every 6 (six) hours as needed for Nausea 12 tablet 0    promethazine (PHENERGAN) 25 MG suppository Place 1 suppository (25 mg) rectally every 6 (six) hours as needed for Nausea 20  suppository 0    pyridostigmine (MESTINON) 60 MG tablet Take 60 mg by mouth 2 (two) times daily       No current facility-administered medications for this visit.       Allergies:  Allergies   Allergen Reactions    Amitriptyline Hives     Patient stated that she received a dose at the hospital and a rash developed all over her face after she arrived home. Ok with nortriptyline.    Haloperidol      Interacts with reglan and also pt had involuntary eye movements.     Latex Itching     Itching irritation swelling condoms  Itching irritation swelling condoms  Itching irritation swelling condoms    Metoclopramide     Morphine Rash       Past Medical History:  Past Medical History:   Diagnosis Date    Abdominal pain     Asthma     mild seasonal controlled    Complication of anesthesia     Cyclical vomiting     Ehlers-Danlos disease  Gastroparesis     Post-operative nausea and vomiting     POTS (postural orthostatic tachycardia syndrome)     [facility] [place]    Sinus trouble     seasonal    UTI (urinary tract infection)     Yeast infection involving the vagina and surrounding area 05/09/2015       Past Surgical History:  Past Surgical History:   Procedure Laterality Date    ADENOIDECTOMY      EGD N/A 01/23/2018    Procedure: EGD;  Surgeon: Jacqulyn Caneekola, Bezawit Dejene, MD;  [facility];  Service: Gastroenterology;  Laterality: N/A;  EGD  Q1=N/A    TONSILLECTOMY      WISDOM TOOTH EXTRACTION         Family History:  Family History   Problem Relation Age of Onset    Cancer Mother     Ehlers-Danlos syndrome Sister     Asthma Brother        Social History:  Social History     Socioeconomic History    Marital status: Single   Tobacco Use    Smoking status: Never    Smokeless tobacco: Never    Tobacco comments:     the patient vapes nicotine    Vaping Use    Vaping status: Every Day     Start date: 04/14/2018   Substance and Sexual Activity    Alcohol use: Not Currently    Drug  use: Yes     Types: Marijuana     Comment: Pt. uses marijuana every day.    Sexual activity: Yes     Partners: Male     Comment: takes the Pill continuously   Other Topics Concern    Poor school performance No    International travel in past 12 mos No     Social Determinants of Health     Financial Resource Strain: Low Risk  (09/17/2019)    Overall Financial Resource Strain (CARDIA)     Difficulty of Paying Living Expenses: Not hard at all   Food Insecurity: No Food Insecurity (09/17/2019)    Hunger Vital Sign     Worried About Running Out of Food in the Last Year: Never true     Ran Out of Food in the Last Year: Never true   Transportation Needs: No Transportation Needs (09/17/2019)    PRAPARE - Therapist, artTransportation     Lack of Transportation (Medical): No     Lack of Transportation (Non-Medical): No       The following portions of the patient's history were reviewed and updated as appropriate: allergies, current medications, past family history, past medical history, past social history, past surgical history and problem list.    ROS:  Review of Systems   Constitutional:  Negative for chills, fatigue and fever.   Respiratory:  Negative for cough and shortness of breath.    Cardiovascular:  Negative for chest pain.         Vitals:  BP 116/80 (BP Site: Left arm, Patient Position: Sitting, Cuff Size: Large)   Pulse (!) 104   Temp 98.8 F (37.1 C) (Tympanic)   Resp 16   Ht 1.626 m (5\' 4" )   Wt 49.4 kg (108 lb 12.8 oz)   SpO2 100%   BMI 18.68 kg/m   Objective:   Physical Exam:  Physical Exam  Constitutional:       General: She is not in acute distress.  Appearance: Normal appearance. She is not ill-appearing or toxic-appearing.   HENT:      Head: Normocephalic and atraumatic.      Nose: Nose normal.   Eyes:      Extraocular Movements: Extraocular movements intact.      Conjunctiva/sclera: Conjunctivae normal.   Pulmonary:      Effort: Pulmonary effort is normal.   Skin:     Comments: Right forearm with trace tender  erythematous scratches. No edema. No bleed.   Neurological:      Mental Status: She is alert and oriented to person, place, and time. Mental status is at baseline.   Psychiatric:         Mood and Affect: Mood normal.         Behavior: Behavior normal.         Thought Content: Thought content normal.         Judgment: Judgment normal.            Assessment/   1. Cat bite of right forearm, initial encounter  - amoxicillin-clavulanate (Augmentin) 875-125 MG per tablet; Take 1 tablet by mouth 2 (two) times daily for 7 days  Dispense: 14 tablet; Refill: 0        Plan:   Topical bacitracin.  If symptoms worse tomorrow, okay to start oral antibiotic    Follow up for worse/persistent symptoms for 1 week    Wynetta Fines, MD

## 2021-12-21 ENCOUNTER — Encounter (INDEPENDENT_AMBULATORY_CARE_PROVIDER_SITE_OTHER): Payer: Self-pay

## 2021-12-22 ENCOUNTER — Encounter (INDEPENDENT_AMBULATORY_CARE_PROVIDER_SITE_OTHER): Payer: Self-pay

## 2022-01-21 ENCOUNTER — Encounter (INDEPENDENT_AMBULATORY_CARE_PROVIDER_SITE_OTHER): Payer: Self-pay

## 2022-01-22 ENCOUNTER — Encounter (INDEPENDENT_AMBULATORY_CARE_PROVIDER_SITE_OTHER): Payer: Self-pay

## 2022-02-20 ENCOUNTER — Encounter (INDEPENDENT_AMBULATORY_CARE_PROVIDER_SITE_OTHER): Payer: Self-pay

## 2022-02-21 ENCOUNTER — Encounter (INDEPENDENT_AMBULATORY_CARE_PROVIDER_SITE_OTHER): Payer: Self-pay

## 2022-03-22 ENCOUNTER — Encounter (INDEPENDENT_AMBULATORY_CARE_PROVIDER_SITE_OTHER): Payer: Self-pay

## 2022-03-23 ENCOUNTER — Encounter (INDEPENDENT_AMBULATORY_CARE_PROVIDER_SITE_OTHER): Payer: Self-pay

## 2022-03-24 ENCOUNTER — Encounter (INDEPENDENT_AMBULATORY_CARE_PROVIDER_SITE_OTHER): Payer: Self-pay

## 2022-04-23 ENCOUNTER — Encounter (INDEPENDENT_AMBULATORY_CARE_PROVIDER_SITE_OTHER): Payer: Self-pay

## 2022-04-24 ENCOUNTER — Encounter (INDEPENDENT_AMBULATORY_CARE_PROVIDER_SITE_OTHER): Payer: Self-pay

## 2022-05-23 ENCOUNTER — Encounter (INDEPENDENT_AMBULATORY_CARE_PROVIDER_SITE_OTHER): Payer: Self-pay

## 2022-05-24 ENCOUNTER — Encounter (INDEPENDENT_AMBULATORY_CARE_PROVIDER_SITE_OTHER): Payer: Self-pay

## 2022-06-23 ENCOUNTER — Encounter (INDEPENDENT_AMBULATORY_CARE_PROVIDER_SITE_OTHER): Payer: Self-pay

## 2022-07-23 ENCOUNTER — Encounter (INDEPENDENT_AMBULATORY_CARE_PROVIDER_SITE_OTHER): Payer: Self-pay

## 2022-08-23 ENCOUNTER — Encounter (INDEPENDENT_AMBULATORY_CARE_PROVIDER_SITE_OTHER): Payer: Self-pay

## 2022-09-23 ENCOUNTER — Encounter (INDEPENDENT_AMBULATORY_CARE_PROVIDER_SITE_OTHER): Payer: Self-pay

## 2022-10-22 ENCOUNTER — Encounter (INDEPENDENT_AMBULATORY_CARE_PROVIDER_SITE_OTHER): Payer: Self-pay

## 2023-04-26 ENCOUNTER — Encounter (INDEPENDENT_AMBULATORY_CARE_PROVIDER_SITE_OTHER): Payer: Self-pay

## 2023-09-07 ENCOUNTER — Emergency Department
Admission: EM | Admit: 2023-09-07 | Discharge: 2023-09-07 | Disposition: A | Discharge status: Home / Self Care | Payer: No Typology Code available for payment source | Attending: Student in an Organized Health Care Education/Training Program | Admitting: Student in an Organized Health Care Education/Training Program

## 2023-09-07 DIAGNOSIS — R112 Nausea with vomiting, unspecified: Secondary | ICD-10-CM | POA: Insufficient documentation

## 2023-09-07 DIAGNOSIS — G90A Postural orthostatic tachycardia syndrome (POTS): Secondary | ICD-10-CM | POA: Insufficient documentation

## 2023-09-07 LAB — LAB USE ONLY - CBC WITH DIFFERENTIAL
Absolute Basophils: 0.06 10*3/uL (ref 0.00–0.08)
Absolute Eosinophils: 0.02 10*3/uL (ref 0.00–0.44)
Absolute Immature Granulocytes: 0.02 10*3/uL (ref 0.00–0.07)
Absolute Lymphocytes: 1.71 10*3/uL (ref 0.42–3.22)
Absolute Monocytes: 0.41 10*3/uL (ref 0.21–0.85)
Absolute Neutrophils: 5.94 10*3/uL (ref 1.10–6.33)
Absolute nRBC: 0 10*3/uL (ref <=0.00)
Basophils %: 0.7 %
Eosinophils %: 0.2 %
Hematocrit: 44.2 % — ABNORMAL HIGH (ref 34.7–43.7)
Hemoglobin: 15 g/dL — ABNORMAL HIGH (ref 11.4–14.8)
Immature Granulocytes %: 0.2 %
Lymphocytes %: 21 %
MCH: 29.8 pg (ref 25.1–33.5)
MCHC: 33.9 g/dL (ref 31.5–35.8)
MCV: 87.9 fL (ref 78.0–96.0)
MPV: 9.2 fL (ref 8.9–12.5)
Monocytes %: 5 %
Neutrophils %: 72.9 %
Platelet Count: 380 10*3/uL — ABNORMAL HIGH (ref 142–346)
Preliminary Absolute Neutrophil Count: 5.94 10*3/uL (ref 1.10–6.33)
RBC: 5.03 10*6/uL (ref 3.90–5.10)
RDW: 14 % (ref 11–15)
WBC: 8.16 10*3/uL (ref 3.10–9.50)
nRBC %: 0 /100{WBCs} (ref <=0.0)

## 2023-09-07 LAB — URINALYSIS WITH REFLEX TO MICROSCOPIC EXAM - REFLEX TO CULTURE
Urine Bilirubin: NEGATIVE
Urine Glucose: NEGATIVE
Urine Leukocyte Esterase: NEGATIVE
Urine Nitrite: NEGATIVE
Urine Specific Gravity: 1.02 (ref 1.001–1.035)
Urine Urobilinogen: NORMAL mg/dL (ref 0.2–2.0)
Urine pH: 6 (ref 5.0–8.0)

## 2023-09-07 LAB — COMPREHENSIVE METABOLIC PANEL
ALT: 14 U/L (ref <=55)
AST (SGOT): 20 U/L (ref <=41)
Albumin/Globulin Ratio: 1.3 (ref 0.9–2.2)
Albumin: 4.5 g/dL (ref 3.5–5.0)
Alkaline Phosphatase: 61 U/L (ref 37–117)
Anion Gap: 12 (ref 5.0–15.0)
BUN: 10 mg/dL (ref 7–21)
Bilirubin, Total: 0.8 mg/dL (ref 0.2–1.2)
CO2: 25 meq/L (ref 17–29)
Calcium: 9.7 mg/dL (ref 8.5–10.5)
Chloride: 99 meq/L (ref 99–111)
Creatinine: 0.7 mg/dL (ref 0.4–1.0)
GFR: >60 mL/min/{1.73_m2} (ref >=60.0)
Globulin: 3.6 g/dL (ref 2.0–3.6)
Glucose: 118 mg/dL — ABNORMAL HIGH (ref 70–100)
Potassium: 3.8 meq/L (ref 3.5–5.3)
Protein, Total: 8.1 g/dL (ref 6.0–8.3)
Sodium: 136 meq/L (ref 135–145)

## 2023-09-07 LAB — LIPASE: Lipase: 20 U/L (ref 8–78)

## 2023-09-07 LAB — BETA HCG QUANTITATIVE, PREGNANCY: hCG, Quantitative: <2.4 m[IU]/mL

## 2023-09-07 MED ORDER — SODIUM CHLORIDE 0.9 % IV BOLUS
1000.0000 mL | Freq: Once | INTRAVENOUS | Status: AC
Start: 2023-09-07 — End: 2023-09-07
  Administered 2023-09-07: 1000 mL via INTRAVENOUS

## 2023-09-07 MED ORDER — PANTOPRAZOLE SODIUM 40 MG PO TBEC
40.0000 mg | DELAYED_RELEASE_TABLET | Freq: Every day | ORAL | 0 refills | Status: AC
Start: 2023-09-07 — End: ?

## 2023-09-07 MED ORDER — PANTOPRAZOLE SODIUM 40 MG IV SOLR
40.0000 mg | Freq: Once | INTRAVENOUS | Status: AC
Start: 2023-09-07 — End: 2023-09-07
  Administered 2023-09-07: 40 mg via INTRAVENOUS

## 2023-09-07 MED ORDER — PROMETHAZINE HCL 25 MG RE SUPP
25.0000 mg | Freq: Four times a day (QID) | RECTAL | 1 refills | Status: AC | PRN
Start: 2023-09-07 — End: 2023-09-12

## 2023-09-07 NOTE — Discharge Instructions (Signed)
 Dear Ms. Othel:    Thank you for choosing the [facility] Emergency Department, the premier emergency department in the [place] area.  I hope your visit today was EXCELLENT. You will receive a survey via text message that will give you the opportunity to provide feedback to your team about your visit. Please do not hesitate to reach out with any questions!    Specific instructions for your visit today:        IF YOU DO NOT CONTINUE TO IMPROVE OR YOUR CONDITION WORSENS, PLEASE CONTACT YOUR DOCTOR OR RETURN IMMEDIATELY TO THE EMERGENCY DEPARTMENT.    Sincerely,  Fernand French RODES, MD  Attending Emergency Physician  [facility] Emergency Department      OBTAINING A PRIMARY CARE APPOINTMENT    Primary care physicians (PCPs, also known as primary care doctors) are either internists or family medicine doctors. Both types of PCPs focus on health promotion, disease prevention, patient education and counseling, and treatment of acute and chronic medical conditions.    If you need a primary care doctor, please call the below number and ask who is receiving new patients.     [facility]  Telephone:  [phone]  [email or web address]    DOCTOR REFERRALS  Call [phone] (available 24 hours a day, 7 days a week) if you need any further referrals and we can help you find a primary care doctor or specialist.  Referral services are also available online at:  [email or web address] Please reach out to our ED Care Management Specialist, Kacie Oneal, if you encounter any difficulty in scheduling your Specalist follow up appointment.  Kacie Oneal can be reached at [phone] or [email or web address].    YOUR CONTACT INFORMATION  Before leaving please check with registration to make sure we have an up-to-date contact number.  You can call registration at [phone] to update your information.  For questions about your hospital bill, please call [phone].  For questions about your  Emergency Dept Physician bill please call [phone].      FREE HEALTH SERVICES  If you need help with health or social services, please call 2-1-1 for a free referral to resources in your area.  2-1-1 is a free service connecting people with information on health insurance, free clinics, pregnancy, mental health, dental care, food assistance, housing, and substance abuse counseling.  Also, available online at:  http://www.[place].org    ORTHOPEDIC INJURY   Please know that significant injuries can exist even when an initial x-ray is read as normal or negative.  This can occur because some fractures (broken bones) are not initially visible on x-rays.  For this reason, close outpatient follow-up with your primary care doctor or bone specialist (orthopedist) is required.    MEDICATIONS AND FOLLOWUP  Please be aware that some prescription medications can cause drowsiness.  Use caution when driving or operating machinery.    The examination and treatment you have received in our Emergency Department is provided on an emergency basis, and is not intended to be a substitute for your primary care physician.  It is important that your doctor checks you again and that you report any new or remaining problems at that time.      ASSISTANCE WITH INSURANCE    Affordable Care Act  [facility])  Call to start or finish an application, compare plans, enroll or ask a question.  [phone]  TTY: [phone]  Web:  Healthcare.gov    Help Enrolling in [facility]  Cover    (612)374-0947 (TOLL-FREE)  (  888) 551-678-5688 (TTY)  Web:  Http://www.coverva.org    Local Help Enrolling in the ACA  [place]  Family Service  [phone] (MAIN)  Email:  health-help@nvfs .org  Web:  Blackjackmyths.is  Address:  [address], [address] [place], [place] [address]    SEDATING MEDICATIONS  Sedating medications include strong pain medications (e.g. narcotics), muscle relaxers, benzodiazepines (used for anxiety and as muscle  relaxers), Benadryl /diphenhydramine  and other antihistamines for allergic reactions/itching, and other medications.  If you are unsure if you have received a sedating medication, please ask your physician or nurse.  If you received a sedating medication: DO NOT drive a car. DO NOT operate machinery. DO NOT perform jobs where you need to be alert.  DO NOT drink alcoholic beverages while taking this medicine.     If you get dizzy, sit or lie down at the first signs. Be careful going up and down stairs.  Be extra careful to prevent falls.     Never give this medicine to others.     Keep this medicine out of reach of children.     Do not take or save old medicines. Throw them away when outdated.     Keep all medicines in a cool, dry place. DO NOT keep them in your bathroom medicine cabinet or in a cabinet above the stove.    MEDICATION REFILLS  Please be aware that we cannot refill any prescriptions through the ER. If you need further treatment from what is provided at your ER visit, please follow up with your primary care doctor or your pain management specialist.    FREESTANDING EMERGENCY DEPARTMENTS OF [facility]  Did you know [place] has two freestanding ERs located just a few miles away?  [facility] [place] and [facility] [place] have short wait times, easy free parking directly in front of the building and top patient satisfaction scores - and the same Board Certified Emergency Medicine doctors as [facility].

## 2023-09-07 NOTE — ED Triage Notes (Addendum)
 Pt c/o intractable vomiting x 4 days. Actively vomiting currently.  Also reports dizziness, abdominal pain

## 2023-09-07 NOTE — ED Provider Notes (Signed)
 [facility] EMERGENCY DEPARTMENT  ATTENDING PHYSICIAN HISTORY AND PHYSICAL EXAM     Patient Name: Bridget [name], Bridget [name]  Department:[facility]  Encounter Date:  09/07/2023  Attending Physician: French Bathe, MD  Age: 28 y.o. female  Patient Room: S 17/S 17  PCP: Graig Asberry RAMAN, FNP           Diagnosis/Disposition:     Final diagnoses:   Nausea and vomiting, unspecified vomiting type       ED Disposition       ED Disposition   Discharge    Condition   --    Date/Time   Wed Sep 07, 2023 11:33 PM    Comment   [name] discharge to home/self care.    Condition at disposition: Stable                 Follow-Up Providers (if applicable)    Your Gastroenterologist    Schedule an appointment as soon as possible for a visit          Discharge Medication List as of 09/07/2023 11:34 PM        START taking these medications    Details   !! pantoprazole  (PROTONIX ) 40 MG tablet Take 1 tablet (40 mg) by mouth daily, Starting Wed 09/07/2023, Normal      !! promethazine  (PHENERGAN ) 25 MG suppository Place 1 suppository (25 mg) rectally every 6 (six) hours as needed for Nausea (as needed for severe vomiting), Starting Wed 09/07/2023, Until Mon 09/12/2023 at 2359, E-Rx       !! - Potential duplicate medications found. Please discuss with provider.              Medical Decision Making:     Initial Differential Diagnosis:  Initial differential diagnosis to include but not limited to: gastroparesis, electrolyte derangement, lower suspicion of acute abdomen    Plan:  EKG, labs, ivf, protonix   29 yo F with ehlers danlos, pots, gastroparesis, intractable nausea/vomiting, here with here with intractable nausea and vomiting.  Vitals remarkable for tachycardia upon arrival otherwise within normal limit.  Suspect this is acute exacerbation of a chronic issue, do not suspect acute abdomen.  Labs are within normal limits, normal electrolytes.  Urine negative for signs of infection.  Patient received IV fluids which improved her heart rate  as well as IV Protonix .  She feels much better at this time.  She was offered antiemetics but does not want any at this time.  She is comfortable going home and following up with her GI doctor.  Will refill her Protonix  and her suppositories of Phenergan  and discharged with close follow-up and strict return precautions    Final Impression:  Intractable nausea and vomiting    The patient was deemed stable for discharge. They were given strict return precautions as it relates to their presumed diagnosis, verbalized understanding of these precautions and agreed to follow up as instructed. All questions were answered prior to discharge.         Medical Decision Making  Risk  Prescription drug management.                            History of Presenting Illness:     Nursing Triage note: Pt w/c from lobby seeking treatment for intractable vomiting. hx of gastroparesis- concern for this at todays visit. Took azythromycin around 1430, and zofran  for nausea- pt mother states pt is having difficulty taking anything PO.  Pt appears tearful in triage. hx of pots- concerned about dizziness and vomiting. RR even and unlabored.  Chief complaint: Emesis and Dizziness    Bridget [name] is a 28 y.o. female with POTS and Ehlers-Danlos, gastroparesis and intractable nausea and vomiting here with intractable nausea and vomiting.  She follows GI at [facility] for similar, recently was taken off Reglan  due to development of tardive dyskinesia started on erythromycin  but was told she should not take her other antiemetics with erythromycin .  Her Protonix  ran out on Saturday.  This feels similar to previous episodes of intractable nausea which are often followed by gastroparesis. No fevers.           Review of Systems:  Physical Exam:     Review of Systems      Pulse (!) 120  BP (!) 130/95  Resp 22  SpO2 98 %  Temp 97.2 F (36.2 C)     Physical Exam  Vitals and nursing note reviewed.   HENT:      Head: Normocephalic and atraumatic.       Nose: Nose normal.      Mouth/Throat:      Mouth: Mucous membranes are moist.   Eyes:      Pupils: Pupils are equal, round, and reactive to light.   Cardiovascular:      Rate and Rhythm: Tachycardia present.   Pulmonary:      Effort: Pulmonary effort is normal.   Abdominal:      General: Abdomen is flat.      Palpations: Abdomen is soft.   Musculoskeletal:      Cervical back: Normal range of motion.      Right lower leg: No swelling or tenderness.      Left lower leg: No swelling or tenderness.   Skin:     General: Skin is warm.      Capillary Refill: Capillary refill takes less than 2 seconds.   Neurological:      General: No focal deficit present.      Mental Status: She is alert.                Interpretations, Clinical Decision Tools and Critical Care:     O2 Sat:  The patient's oxygen saturation was 98 % on room air. This was independently interpreted by me as Normal.            Procedures:   Procedures      Attestations:     French Bathe, MD    Scribe Attestation: There was no scribe involved in the care of this patient.     Documentation Notes:  Parts of this note were generated by the Epic EMR system/ Dragon speech recognition and may contain inherent errors or omissions not intended by the user. Grammatical errors, random word insertions, deletions, pronoun errors and incomplete sentences are occasional consequences of this technology due to software limitations. Not all errors are caught or corrected.  My documentation is often completed after the patient is no longer under my clinical care. In some cases, the Epic EMR may pull updated results into the above documentation which may not reflect all results or information that were available to me at the time of my medical decision making.   If there are questions or concerns about the content of this note or information contained within the body of this dictation they should be addressed directly with the author for clarification.  Fernand French RODES, MD  09/08/23 [phone]

## 2023-09-07 NOTE — ED Notes (Signed)
 I have assisted in the ordering of diagnostic studies necessary to expedite care. I am not the primary provider for this patient. Please refer to the provider/reassessment/disposition notes for further information about this patient's emergency department course.       Giacomo Candise HERO, MD  09/07/23 2100

## 2023-09-08 LAB — LAB USE ONLY - URINE GRAY CULTURE HOLD TUBE

## 2023-09-11 ENCOUNTER — Emergency Department
Admission: EM | Admit: 2023-09-11 | Discharge: 2023-09-11 | Disposition: A | Discharge status: Home / Self Care | Payer: No Typology Code available for payment source | Attending: Emergency Medicine | Admitting: Emergency Medicine

## 2023-09-11 DIAGNOSIS — K3184 Gastroparesis: Secondary | ICD-10-CM | POA: Insufficient documentation

## 2023-09-11 DIAGNOSIS — R1084 Generalized abdominal pain: Secondary | ICD-10-CM | POA: Insufficient documentation

## 2023-09-11 LAB — BASIC METABOLIC PANEL
Anion Gap: 10 (ref 5.0–15.0)
BUN: 8 mg/dL (ref 7–21)
CO2: 26 meq/L (ref 17–29)
Calcium: 9.3 mg/dL (ref 8.5–10.5)
Chloride: 101 meq/L (ref 99–111)
Creatinine: 0.7 mg/dL (ref 0.4–1.0)
GFR: >60 mL/min/{1.73_m2} (ref >=60.0)
Glucose: 113 mg/dL — ABNORMAL HIGH (ref 70–100)
Potassium: 3.8 meq/L (ref 3.5–5.3)
Sodium: 137 meq/L (ref 135–145)

## 2023-09-11 LAB — LAB USE ONLY - CBC WITH DIFFERENTIAL
Absolute Basophils: 0.06 10*3/uL (ref 0.00–0.08)
Absolute Eosinophils: 0.01 10*3/uL (ref 0.00–0.44)
Absolute Immature Granulocytes: 0.02 10*3/uL (ref 0.00–0.07)
Absolute Lymphocytes: 1.34 10*3/uL (ref 0.42–3.22)
Absolute Monocytes: 0.53 10*3/uL (ref 0.21–0.85)
Absolute Neutrophils: 2.24 10*3/uL (ref 1.10–6.33)
Absolute nRBC: 0 10*3/uL (ref <=0.00)
Basophils %: 1.4 %
Eosinophils %: 0.2 %
Hematocrit: 45.6 % — ABNORMAL HIGH (ref 34.7–43.7)
Hemoglobin: 15.2 g/dL — ABNORMAL HIGH (ref 11.4–14.8)
Immature Granulocytes %: 0.5 %
Lymphocytes %: 31.9 %
MCH: 29.6 pg (ref 25.1–33.5)
MCHC: 33.3 g/dL (ref 31.5–35.8)
MCV: 88.7 fL (ref 78.0–96.0)
MPV: 9.5 fL (ref 8.9–12.5)
Monocytes %: 12.6 %
Neutrophils %: 53.4 %
Platelet Count: 293 10*3/uL (ref 142–346)
Preliminary Absolute Neutrophil Count: 2.24 10*3/uL (ref 1.10–6.33)
RBC: 5.14 10*6/uL — ABNORMAL HIGH (ref 3.90–5.10)
RDW: 14 % (ref 11–15)
WBC: 4.2 10*3/uL (ref 3.10–9.50)
nRBC %: 0 /100{WBCs} (ref <=0.0)

## 2023-09-11 LAB — HEPATIC FUNCTION PANEL (LFT)
ALT: 17 U/L (ref <=55)
AST (SGOT): 22 U/L (ref <=41)
Albumin/Globulin Ratio: 1.4 (ref 0.9–2.2)
Albumin: 4.5 g/dL (ref 3.5–5.0)
Alkaline Phosphatase: 60 U/L (ref 37–117)
Bilirubin Direct: 0.1 mg/dL (ref 0.0–0.5)
Bilirubin Indirect: 0.2 mg/dL (ref 0.2–1.0)
Bilirubin, Total: 0.3 mg/dL (ref 0.2–1.2)
Globulin: 3.2 g/dL (ref 2.0–3.6)
Protein, Total: 7.7 g/dL (ref 6.0–8.3)

## 2023-09-11 LAB — BETA HCG QUANTITATIVE, PREGNANCY: hCG, Quantitative: <2.4 m[IU]/mL

## 2023-09-11 LAB — LIPASE: Lipase: 24 U/L (ref 8–78)

## 2023-09-11 MED ORDER — PANTOPRAZOLE SODIUM 40 MG IV SOLR
40.0000 mg | Freq: Once | INTRAVENOUS | Status: AC
Start: 2023-09-11 — End: 2023-09-11
  Administered 2023-09-11: 40 mg via INTRAVENOUS
  Filled 2023-09-11: qty 40

## 2023-09-11 MED ORDER — PROCHLORPERAZINE EDISYLATE 10 MG/2ML IJ SOLN
5.0000 mg | Freq: Once | INTRAMUSCULAR | Status: DC
Start: 2023-09-11 — End: 2023-09-11

## 2023-09-11 MED ORDER — SODIUM CHLORIDE 0.9 % IV BOLUS
1000.0000 mL | Freq: Once | INTRAVENOUS | Status: AC
Start: 2023-09-11 — End: 2023-09-11
  Administered 2023-09-11: 1000 mL via INTRAVENOUS

## 2023-09-11 NOTE — Discharge Instructions (Signed)
 Dear [name]:    Thank you for choosing the [facility] Emergency Department, the premier emergency department in the [place] area.  I hope your visit today was EXCELLENT. You will receive a survey via text message that will give you the opportunity to provide feedback to your team about your visit. Please do not hesitate to reach out with any questions!    Specific instructions for your visit today:        IF YOU DO NOT CONTINUE TO IMPROVE OR YOUR CONDITION WORSENS, PLEASE CONTACT YOUR DOCTOR OR RETURN IMMEDIATELY TO THE EMERGENCY DEPARTMENT.    Sincerely,  Jini Horiuchi, Lynwood CHRISTELLA Raddle., MD  Attending Emergency Physician  [facility] Emergency Department      OBTAINING A PRIMARY CARE APPOINTMENT    Primary care physicians (PCPs, also known as primary care doctors) are either internists or family medicine doctors. Both types of PCPs focus on health promotion, disease prevention, patient education and counseling, and treatment of acute and chronic medical conditions.    If you need a primary care doctor, please call the below number and ask who is receiving new patients.     [facility]  Telephone:  [phone]  [email or web address]    DOCTOR REFERRALS  Call [phone] (available 24 hours a day, 7 days a week) if you need any further referrals and we can help you find a primary care doctor or specialist.  Referral services are also available online at:  [email or web address] Please reach out to our ED Care Management Specialist, Kacie Oneal, if you encounter any difficulty in scheduling your Specalist follow up appointment.  Kacie Oneal can be reached at [phone] or [email or web address].    YOUR CONTACT INFORMATION  Before leaving please check with registration to make sure we have an up-to-date contact number.  You can call registration at [phone] to update your information.  For questions about your hospital bill, please call [phone].  For questions about  your Emergency Dept Physician bill please call [phone].      FREE HEALTH SERVICES  If you need help with health or social services, please call 2-1-1 for a free referral to resources in your area.  2-1-1 is a free service connecting people with information on health insurance, free clinics, pregnancy, mental health, dental care, food assistance, housing, and substance abuse counseling.  Also, available online at:  http://www.[facility].org    ORTHOPEDIC INJURY   Please know that significant injuries can exist even when an initial x-ray is read as normal or negative.  This can occur because some fractures (broken bones) are not initially visible on x-rays.  For this reason, close outpatient follow-up with your primary care doctor or bone specialist (orthopedist) is required.    MEDICATIONS AND FOLLOWUP  Please be aware that some prescription medications can cause drowsiness.  Use caution when driving or operating machinery.    The examination and treatment you have received in our Emergency Department is provided on an emergency basis, and is not intended to be a substitute for your primary care physician.  It is important that your doctor checks you again and that you report any new or remaining problems at that time.      ASSISTANCE WITH INSURANCE    Affordable Care Act  [facility])  Call to start or finish an application, compare plans, enroll or ask a question.  [phone]  TTY: ([phone]  Web:  Healthcare.gov    Help Enrolling in [facility]  [facility] [place]   (718)650-0236 (  TOLL-FREE)  510-846-3017 (TTY)  Web:  Http://www.coverva.org    Local Help Enrolling in the [employer]  [place]  Family Service  575-839-6844 (MAIN)  Email:  health-help@nvfs .org  Web:  Blackjackmyths.is  Address:  [address], [address] [place], [place] [address]    SEDATING MEDICATIONS  Sedating medications include strong pain medications (e.g. narcotics), muscle relaxers, benzodiazepines (used for anxiety and as muscle  relaxers), Benadryl /diphenhydramine  and other antihistamines for allergic reactions/itching, and other medications.  If you are unsure if you have received a sedating medication, please ask your physician or nurse.  If you received a sedating medication: DO NOT drive a car. DO NOT operate machinery. DO NOT perform jobs where you need to be alert.  DO NOT drink alcoholic beverages while taking this medicine.     If you get dizzy, sit or lie down at the first signs. Be careful going up and down stairs.  Be extra careful to prevent falls.     Never give this medicine to others.     Keep this medicine out of reach of children.     Do not take or save old medicines. Throw them away when outdated.     Keep all medicines in a cool, dry place. DO NOT keep them in your bathroom medicine cabinet or in a cabinet above the stove.    MEDICATION REFILLS  Please be aware that we cannot refill any prescriptions through the ER. If you need further treatment from what is provided at your ER visit, please follow up with your primary care doctor or your pain management specialist.    FREESTANDING EMERGENCY DEPARTMENTS OF [facility]  Did you know [facility] has two freestanding ERs located just a few miles away?  [facility] [place] and [place] [facility] [place] have short wait times, easy free parking directly in front of the building and top patient satisfaction scores - and the same Board Certified Emergency Medicine doctors as [facility].

## 2023-09-11 NOTE — ED Triage Notes (Signed)
 Pt c/o intermittent emesis since last Sat. That is now more constant since today morning. Per patient, has hx of same, been on protonix  - threw up today's morning dose. Patient endorsing acid/burning pain at abdomen.   +emesis upon this RN entering the room.  Patient speaking in full sentences. Denies resp distress. +pale/weakness.

## 2023-09-11 NOTE — ED Provider Notes (Signed)
 [facility] EMERGENCY DEPARTMENT  ATTENDING PHYSICIAN HISTORY AND PHYSICAL EXAM     Patient Name: Bridget, [name]  Department:[facility]  Encounter Date:  09/11/2023  Attending Physician: [place] Lynwood CHRISTELLA Mickey., MD   Age: 28 y.o. female  Patient Room: 108/108  PCP: Graig Asberry RAMAN, FNP           Diagnosis/Disposition:     Final diagnoses:   Generalized abdominal pain   Gastroparesis       ED Disposition       ED Disposition   Discharge    Condition   --    Date/Time   Sun Sep 11, 2023  6:21 PM    Comment   Jyllian Haynie discharge to home/self care.    Condition at disposition: Stable                 Follow-Up Providers (if applicable)    Graig Asberry RAMAN, FNP  [address]  [place] [address]  [phone]    In 1 week         Discharge Medication List as of 09/11/2023  6:42 PM              Medical Decision Making:              Medical Decision Making  Based on the patient's history and evaluation appears to be exacerbation of her chronic gastroparesis issues.  Old records were reviewed.  Patient was seen here 4 days ago for similar issues.  Multiple studies were obtained to the emergency department to look for any acute issues, such as electrolyte abnormalities, anemia, pregnancy related issues, etc.  Studies were all nondiagnostic.  Spoke extensively with the patient about med management.  Strongly encouraged her to stop using marijuana as it is very much associated with these symptoms, although patient states she has been having the symptoms before she started using marijuana.  Was given IV fluids and Protonix .  On reassessment patient is feeling better.  Patient has been discharged stable addition    Amount and/or Complexity of Data Reviewed  Labs: ordered.    Risk  Prescription drug management.            History of Presenting Illness:     Nursing Triage note: pt wheelchaired to triage c/o of intermittent vomiting since last saturday. pt states did start to feel better and ate last  night but vomiting started back up this morning and has been unable to tolerate PO intake. taking phenergan  and zofran  at home without relief. hx of ED and POTs. endorsing acid reflux and burning sensation in abdomen. A&Ox4. resps even and unlabored. appear pale and weak.  Chief complaint: Abdominal Pain, Emesis, and Nausea    Bridget [name] is a 28 y.o. female who presents to the emergency department today with recurrent nausea and vomiting.  Patient states she has a history of gastroparesis which is typically managed by nortriptyline  and Protonix .  However, she has had increasing vomiting and has not been able to keep her Protonix  medication down.  States she also uses marijuana, although was having problems with this before she started using.  Has had some success with Reglan  in the past, but developed a dystonic reaction previously when she also received Haldol  with Reglan .  Otherwise, patient complaint          Review of Systems:  Physical Exam:         Positive and negative  ROS per above and in HPI.     Triage Vitals: Pulse 98  BP (!) 130/96  Resp 17  SpO2 100 %  Temp 98.3 F (36.8 C)     Constitutional: thin female, laying on a gurney, in no acute distress  Head: Normocephalic, atraumatic  Eyes: EOMI.  Sclera not pale, injected.  ENT: Mucous membranes moist.  Nose symmetric.  Neck: Normal range of motion. Non-tender.  Respiratory/Chest: Clear to auscultation. No respiratory distress.   Cardiovascular: RRR w/o murmur/gallop  Abdomen: Mild tenderness to palpation diffusely.  No rebound, guarding Comfort Marilyn has history  Musculoskeletal: No edema. No cyanosis.  Neurological: CN II-XII intact grossly. No focal motor deficits by observation. Speech normal.  Skin: Warm and dry. No rash.  Psychiatric: Normal affect. Normal concentration.             Interpretations, Clinical Decision Tools and Critical Care:                                     Procedures:   Procedures      Attestations:     Scribe  Attestation: There was no scribe involved in the care of this patient.     Documentation Notes:  Parts of this note were generated by the Epic EMR system/ Dragon speech recognition and may contain inherent errors or omissions not intended by the user. Grammatical errors, random word insertions, deletions, pronoun errors and incomplete sentences are occasional consequences of this technology due to software limitations. Not all errors are caught or corrected.  My documentation is often completed after the patient is no longer under my clinical care. In some cases, the Epic EMR may pull updated results into the above documentation which may not reflect all results or information that were available to me at the time of my medical decision making.   If there are questions or concerns about the content of this note or information contained within the body of this dictation they should be addressed directly with the author for clarification.                  Shandra Szymborski, Lynwood CHRISTELLA Raddle., MD  09/11/23 2047

## 2023-09-24 ENCOUNTER — Encounter (INDEPENDENT_AMBULATORY_CARE_PROVIDER_SITE_OTHER): Payer: Self-pay

## 2023-10-22 ENCOUNTER — Encounter (INDEPENDENT_AMBULATORY_CARE_PROVIDER_SITE_OTHER): Payer: Self-pay

## 2023-11-22 ENCOUNTER — Encounter (INDEPENDENT_AMBULATORY_CARE_PROVIDER_SITE_OTHER): Payer: Self-pay

## 2023-12-22 ENCOUNTER — Encounter (INDEPENDENT_AMBULATORY_CARE_PROVIDER_SITE_OTHER): Payer: Self-pay

## 2024-05-14 ENCOUNTER — Encounter (INDEPENDENT_AMBULATORY_CARE_PROVIDER_SITE_OTHER): Payer: Self-pay

## 2024-06-12 ENCOUNTER — Ambulatory Visit (INDEPENDENT_AMBULATORY_CARE_PROVIDER_SITE_OTHER): Admitting: Urology

## 2024-07-27 ENCOUNTER — Encounter (INDEPENDENT_AMBULATORY_CARE_PROVIDER_SITE_OTHER): Payer: Self-pay | Admitting: Urology

## 2024-08-07 ENCOUNTER — Ambulatory Visit (INDEPENDENT_AMBULATORY_CARE_PROVIDER_SITE_OTHER): Admitting: Urology

## 2024-08-07 ENCOUNTER — Encounter (INDEPENDENT_AMBULATORY_CARE_PROVIDER_SITE_OTHER): Payer: Self-pay | Admitting: Urology

## 2024-08-07 VITALS — BP 134/82 | HR 79 | Temp 98.4°F | Ht 64.17 in | Wt 120.0 lb

## 2024-08-07 DIAGNOSIS — K30 Functional dyspepsia: Secondary | ICD-10-CM

## 2024-08-07 DIAGNOSIS — R3915 Urgency of urination: Secondary | ICD-10-CM

## 2024-08-07 DIAGNOSIS — R3911 Hesitancy of micturition: Secondary | ICD-10-CM

## 2024-08-07 DIAGNOSIS — Q796 Ehlers-Danlos syndrome, unspecified: Secondary | ICD-10-CM

## 2024-08-07 DIAGNOSIS — N390 Urinary tract infection, site not specified: Secondary | ICD-10-CM

## 2024-08-07 DIAGNOSIS — R3914 Feeling of incomplete bladder emptying: Secondary | ICD-10-CM

## 2024-08-07 DIAGNOSIS — R1115 Cyclical vomiting syndrome unrelated to migraine: Secondary | ICD-10-CM

## 2024-08-07 DIAGNOSIS — G90A Postural orthostatic tachycardia syndrome (POTS): Secondary | ICD-10-CM

## 2024-08-07 DIAGNOSIS — N809 Endometriosis, unspecified: Secondary | ICD-10-CM

## 2024-08-07 LAB — URINALYSIS POCT
Urine Bilirubin POCT: NEGATIVE
Urine Blood POCT: NEGATIVE
Urine Glucose POCT: NEGATIVE mg/dL
Urine Ketones POCT: NEGATIVE mg/dL
Urine Leukocyte Esterase POCT: NEGATIVE
Urine Nitrites POCT: NEGATIVE
Urine Protein POCT: NEGATIVE mg/dL
Urine Specific Gravity POCT: <=1.005
Urine Urobilinogen POCT: 0.2 mg/dL (ref 0.2–2.0)
Urine pH POCT: 6.5 (ref 5.0–8.0)

## 2024-08-07 NOTE — Progress Notes (Signed)
 Subjective:      Patient ID: Bridget [name] is a 28 y.o. female     Chief Complaint:  1. Delayed gastric emptying    2. Ehlers-Danlos syndrome    3. Endometriosis    4. Cyclic vomiting syndrome    5. Postural orthostatic tachycardia syndrome    6. Urinary hesitancy    7. Urinary urgency    8. Feeling of incomplete bladder emptying    9. Recurrent UTI        History of Present Illness  Bridget [name] is a 28 year old female with POTS, Ehlers-Danlos syndrome, cyclical vomiting syndrome, endometriosis, gastroparesis, and recurrent urinary tract infections who presents for evaluation of chronic lower urinary tract symptoms.  For the past 12 to 18 months, she has experienced feeling of incomplete bladder emptying, urinary hesitancy with difficulty initiating the stream, and increased urinary frequency, voiding at least once per hour, often with low volumes. She sometimes suppresses the urge to void, but at times has significant urgency. She denies recent changes in chronic medications, prior pelvic surgery, or participation in pelvic physical therapy. She has not had recent urinary tract imaging; her last ultrasound was in high school.  She has a lifelong history of recurrent urinary tract infections, with repeated episodes of Klebsiella isolated on urine culture over the past two years, typically months apart. She has not had a urinary tract infection for approximately six months, with no changes in sexual activity, supplements, or other interventions. Urine cultures during her annual physical last year were negative for Klebsiella.   She is sexually active with a long-term partner and experiences occasional dyspareunia, which she attributes to a short cervix. The discomfort is mild and intermittent. Her sister has endometriosis and POTS with similar urinary symptoms but has not sought evaluation. Her sister has undergone pelvic surgery for endometriosis, whereas she has not.        The following portions of  the patient's history were reviewed and updated as appropriate: allergies, current medications, past family history, past medical history, past social history, past surgical history and problem list.    Review of Systems  As per HPI      Objective:   BP 134/82 (BP Site: Right arm, Patient Position: Sitting, Cuff Size: Medium)   Pulse 79   Temp 98.4 F (36.9 C) (Oral)   Ht 1.63 m (5' 4.17)   Wt 54.4 kg (120 lb)   BMI 20.49 kg/m   General appearance - alert, well appearing, and in no distress  Mental status - alert, oriented to person, place, and time, appropriate affect      Lab Review   Today I have personally reviewed the following urinary test results:    Results for orders placed or performed in visit on 08/07/24 (from the past 24 hours)   Urinalysis POCT    Collection Time: 08/07/24 10:05 AM   Result Value    Urine Color POCT Light Yellow    Urine Clarity POCT Clear    Urine pH POCT 6.5    Urine Leukocyte Esterase POCT Negative    Urine Nitrites POCT Negative    Urine Protein POCT Negative    Urine Glucose POCT Negative    Urine Ketones POCT Negative    Urine Urobilinogen POCT 0.2    Urine Bilirubin POCT Negative    Urine Blood POCT Negative    Urine Specific Gravity POCT <=1.005          Radiology Review  Today I have personally reviewed the following radiology exams:    none  Assessment:     1. Delayed gastric emptying    2. Ehlers-Danlos syndrome    3. Endometriosis    4. Cyclic vomiting syndrome    5. Postural orthostatic tachycardia syndrome    6. Urinary hesitancy    7. Urinary urgency    8. Feeling of incomplete bladder emptying    9. Recurrent UTI        Assessment & Plan  Neurogenic bladder dysfunction  Chronic incomplete bladder emptying, urinary hesitancy, and increased frequency likely due to Ehlers-Danlos syndrome, POTS, and delayed gastric emptying. Objective bladder emptying assessment needed and antimuscarinics contraindicated.  - Ordered renal and bladder ultrasound with post-void  residual.  - Referred to pelvic floor physical therapy.  - Discussed potential future need for self-catheterization if significant retention identified.  - Advised to arrive for ultrasound with a comfortably full bladder and void as usual during the test.    Recurrent urinary tract infection  Lifelong recurrent UTIs with repeated Klebsiella isolation suggest possible colonization rather than true infection. No current acute infection or symptom change. Minimize antibiotic use without acute symptoms.  - Sent urine for standard culture.  - Discussed minimizing antibiotic use without acute symptoms.    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 Currently asymptomatic for acute infection. Treatment only if organisms detected and symptoms significant.  - Sent urine for Ureaplasma and Mycoplasma testing.  - Discussed potential antibiotic treatment if organisms detected and symptoms significant, with shared decision making.  - Advised partner testing and treatment if pursued, with guidance on obtaining partner testing and treatment at a freestanding clinic.    I spent more than 60 total minutes for the following activities:  Interviewing/examining the patient  Reviewing relevant notes, and prior history  Reviewing/interpreting/ordering tests and medications  Counseling and educating the patient/family/caregiver with care coordination  Documenting/charting clinical information in EPIC.          Please pardon any potential grammatical errors or typos as aspects of this note may have been created through speech-to-text software      Plan:     Patient Instructions   Today we discussed my suspicion that some of your other medical conditions including POTS, Ehlers-Danlos, and gastroparesis are contributing to bothersome urinary issues as well    We discussed the importance of checking how well the bladder actually empties  with urination, and to make sure that no urine is backing up to the kidneys.  Please do this by completing an ultrasound of the urinary tract.    I have written an order for your radiology imaging study.  There are many convenient locations where these tests can be performed, below are two options (Please pick one).  To schedule the test, you will need to call or go online:     [facility]:                                  [phone]) [phone] or [email or web address]  [facility]:              [phone] or [email or web address]        Given your      Today we discussed my recommendations to start PFPT - Pelvic Floor Physical Therapy   - I have placed a referral for  this today   - please see below for a list of recommended providers. Call to make an appointment for yourself.     Please follow-up in 4 to 6 months or sooner if needed      [place] [facility] Pelvic Floor Program  [facility]  [facility]  [address]  [place], [place] [address]   (P):[phone]  Ronal Landry Hummer PT    [facility] - [place]  [phone]  [place], [place] [address]   (P): [phone]  PAIGE): [phone]  Camelia Alu PT, DPT, Delon Shaker PTA    /[place]  [facility] Center - [place]  [address]  [place], [place] [address]  (P):  [phone]  (F): [phone]   Peyton Real PT, Odella Ada PTA, Lendia Bile PT    [facility] Physical Therapy Center- [facility]  [address]  [place], [place] [address]  (P): [phone]  (F): [phone]  Elysa Fiddler PT, DPT    [place] Center- [facility]  [address]  [place], [place] [address]  (P): [phone]  Leita Nose PT, DPT, Baldomero Argyle PT, DPT    [facility]  [facility]  [address]  [place], [place] [address]  P):[phone]  Marylynn Mill PT, Isaac Kava  PT    [facility] - [place]  [address]  [place], [place] [address]  (P):[phone]  Armando Meier PT MDT, Izetta Fetters PT, DPT, CCCE , Corean Needle PT, DPT, Debby Kraft PT,   DPT, Laneta Novak PT, DPT [facility]  [facility]  [address]  [place], [place] [address]  (P): [phone]  Loney Lack PT, DPT      [facility]  Address: [address]  [place], [place] [address]  (P): [phone]  Kate Shank PT, DPT      [facility] Physical Therapy Center - [place]  [address]  [place], [place] [address]  ELONA): [phone]  (F): [phone]  Tinnie Pope PT, DPT    [facility] - [facility]  [address]  [place], [place] [address]  ELONA): [phone]  (F): [phone]  Corean Needle PT DPT, Andrea Nims PT, DPT, Delon Shaker PTA    [facility] Physical Therapy Center - [place]  [address]  [place], [place] [address]  [phone],  (F): [phone]  Laymon Hidden PT, DPT, Sunday Bathe PT, DPT Babara Revering, LPTA, Doneta Bones, PT, DPT    [facility] Physical Therapy - [facility]  [address]  [place], [place] [address]  (P): [phone]  Kate Shank PT, DPT, Laymon Hidden PT, DPT, Odella Ada PTA, Marina  Aimee PT, DPT Neil Opitz PT, DPT Kaelie Jager, PT, DPT    [facility]  [address]  [place], [place] [address]  (P):[phone]  (F): [phone]  Mliss (Rimel) Baldwin PT, DPT, CLT, Adrien Kiang LPTA, Olam Snowball PT, DPT       EXTERNAL Pelvic Floor Physical Therapy Providers    [facility]  [address], [place] [address]   Elijah Bogus, MHS,PT  Ph: [phone] Fax: [phone]    [facility]  [address] [place], [place] [address]  Dr Vernell Love, DFT  Ph: [phone]    [facility]  [address] [facility] [address]   Pany Nazari, PT, BCB-PMD Ph: [phone] Fax: [phone]    [facility] [address], [place], [place] [address]   Wanda Mare, [place] Ph: [phone] Fax: [phone]    [facility] [address], [place] [address]   Guss Solders DPT  Ph: [phone] Fax: [phone]    [facility]  [address] [place] [address]   Darice Martini, MPT  Ph: [phone] Fax: [phone]    [facility] [address] [place], [place] , [place] [address]  Angeline Citron, MPT/Karen Melbeta MSPT Ph: [phone] Fax: [phone]    [facility] [address], [place], [place] [address]   Winton Embs, PT, [place]  Ey:[phone] Fax: [phone]    [facility] [address], [place] [address]   Ronal Polka, MPT/ Mickel Rideau, PT, MHA/Lynn Nicholes Monty Boehringer, PT, DPT       Ph: [phone] Fax: [phone]    [facility]    [address], [place] [address]       [address], [place] [address]  Delon Seip, MS, PT  Rh: [phone] Fax: [phone]    [facility]  [address], [place], [place] [address]   Elijah Bogus, PT, MHS  Ph: [phone] Fax: [phone]    Rollene Shutter, MSPT  [address], [place], [place] [address]  Edsel Ee, PT, DPT   Ph: [phone] Fax: [phone]           Orders  Orders Placed This Encounter   Procedures    Culture, Urine     Standing Status:   Future     Expected Date:   08/07/2024     Expiration Date:   08/07/2025     Release to patient:   Immediate    Mycoplasma hominis/ Ureaplasma culture     Standing Status:   Future     Number of Occurrences:   1     Expected Date:   08/07/2024     Expiration Date:   08/07/2025     Specimen Source::   Urine, Clean  Catch [76]     Release to patient:   Immediate    US  Renal Kidney Bladder Complete     Scheduling Instructions:      urgency and hesitancy,  assess bladder emptying     Additional reasons for exam:   See below     Release to patient:   Immediate     Reason for Exam::   See below    Ambulatory referral to Physical Therapy     Standing Status:   Future     Expected Date:   08/08/2024     Expiration Date:   08/07/2025     Referral Priority:   Routine     Referral Type:   Consultation     Referral Reason:   Services Requested     Requested Specialty:   Physical Therapy     Number of Visits Requested:   1

## 2024-08-07 NOTE — Patient Instructions (Addendum)
 Today we discussed my suspicion that some of your other medical conditions including POTS, Ehlers-Danlos, and gastroparesis are contributing to bothersome urinary issues as well    We discussed the importance of checking how well the bladder actually empties with urination, and to make sure that no urine is backing up to the kidneys.  Please do this by completing an ultrasound of the urinary tract.    I have written an order for your radiology imaging study.  There are many convenient locations where these tests can be performed, below are two options (Please pick one).  To schedule the test, you will need to call or go online:     [facility]:                                  [phone] or [email or web address]  [facility]:              [phone] or [email or web address]        Given your history of recurrent urinary infections, today urine will be sent for culture both for standard organisms as well as for Ureaplasma and mycoplasma   - We discussed that since you are not having acutely worsening urinary symptoms we may very well hold off on the use of antibiotics   - The exception to this may be if urine tests come back positive for Ureaplasma and mycoplasma, we would advise additional testing for your partner, and then would need to consider treatment for both of you to see if this may improve some of your baseline urinary symptoms    Today we discussed my recommendations to start PFPT - Pelvic Floor Physical Therapy   - I have placed a referral for this today   - please see below for a list of recommended providers. Call to make an appointment for yourself.     Please follow-up in 4 to 6 months or sooner if needed      [place] [facility] Pelvic Floor Program  [facility]  [facility]  [address]  [place], [place] [address]   (P):[phone]  Ronal Landry Hummer PT    [facility] Physical Therapy Center - [place]  [phone]  [place], [place]  [address]   (P): [phone]  PAIGE): [phone]  Camelia Alu PT, DPT, Delon Shaker PTA    [place]  [facility] Therapy Center - [facility]  [address]  [place], [place] [address]  (P):  [phone]  (F): [phone]   Peyton Real PT, Odella Ada PTA, Lendia Bile PT    [facility] Physical Therapy Center- [facility]  [address]  [place], [place] [address]  (P): [phone]  (F): [phone]  Elysa Fiddler PT, DPT    [place] Therapy Center- [facility]  [address]  [place], [place] [address]  (P): [phone]  Leita Nose PT, DPT, Baldomero Argyle PT, DPT    [facility]  [address] 4th Floor  [place], [place] [address]  P):[phone]  Marylynn Mill PT, Isaac Kava PT    [facility] Physical Therapy Center - [place]  [address]  [place], [place] [address]  (P):[phone]  Armando Meier PT MDT, Izetta Fetters PT, DPT, CCCE , Corean Needle PT, DPT, Debby Kraft PT,   DPT, Natalie  Myrna KLEIN, DPT [facility]  [facility]  [address]  [place], [place] [address]  (P): [phone]  Loney Lack PT, DPT      [facility]  Address: [address]  [place], [place] [address]  (P): [phone]  Kate Shank PT, DPT      [facility] Physical Therapy Center - [place]  [address]  [place], [place] [address]  ELONA): [phone]  PAIGE): [phone]  Tinnie Pope PT, DPT    [facility] - [facility]  [address]  [place], [place] [address]  EL[place]): [phone]  (F): [phone]  Corean Needle PT DPT, Andrea Nims PT, DPT, Delon Shaker PTA    [facility] Physical Therapy Center - [place]  [address]  [place], [place] [address]  [phone],  (F): [phone]  Laymon Hidden PT, DPT, Sunday Bathe PT, DPT Babara Revering, LPTA, Doneta Bones, PT, DPT    [facility] Physical Therapy - [facility]  [address]  [place], [place] [address]  (P): [phone]  Kate Shank PT, DPT, Laymon Hidden PT, DPT, Odella Ada PTA, Marina  Aimee PT, DPT Neil Opitz PT, DPT Kaelie Jager, PT, DPT    [facility]  [address]  [place], [place] [address]  (P):[phone]  (F): [phone]  Mliss (Rimel) Baldwin PT, DPT, CLT, Adrien Kiang LPTA, Olam Snowball PT, DPT       EXTERNAL Pelvic Floor Physical Therapy Providers    [facility]  [address], [place] [address]   Elijah Bogus, MHS,PT  Ph: [phone] Fax: [phone]    [facility] [address] [place], [place] [address]  Dr Vernell Love, DFT  Ph: [phone]    [facility]  [address] [facility] [address]   Pany Nazari, PT, BCB-PMD Ph: [phone] Fax: [phone]    [facility] [address], [place], [place] [address]   Wanda Mare, [place] Ph: [phone] Fax: [phone]    [facility] [address], [place] [address]   Guss Solders DPT  Ph: [phone] Fax: [phone]    [facility]  [address], [place] [address]   Darice Martini, MPT  Ph: [phone] Fax: [phone]    [facility] [address] [place], [place] , [place] [address]  Angeline Citron, MPT/Karen [place] MSPT Ph: [phone] Fax: [phone]    [facility] [address], [place], [place] [address]   Winton Embs, PT, [place]  Ey:[phone] Fax: [phone]    [facility] [address], [place] [address]   Ronal Polka, MPT/ Mickel Rideau, PT, MHA/Lynn Nicholes Monty Boehringer, PT, DPT       Ph: [phone] Fax: [phone]    [facility]    [address],  [place] [address]       [address], [place] [address]  Delon Seip, MS, PT  Rh: [phone] Fax: [phone]    [facility]  [address], [place], [place] [address]   Elijah Bogus, PT, MHS  Ph: [phone] Fax: [phone]    [facility] Shutter, MSPT  [address], [place], [place] [address]  Edsel  Rumalda, PT, DPT   Ph: [phone] Fax: [phone]

## 2024-08-08 LAB — CULTURE, URINE: Culture Urine: NO GROWTH

## 2024-08-10 ENCOUNTER — Ambulatory Visit (INDEPENDENT_AMBULATORY_CARE_PROVIDER_SITE_OTHER): Payer: Self-pay | Admitting: Urology

## 2024-08-14 LAB — MYCOPLASMA HOMINIS / UREAPLASMA CULTURE
Mycoplasma hominis Culture: NEGATIVE
Ureaplasma/Mycoplasma Culture: NEGATIVE

## 2024-09-20 ENCOUNTER — Ambulatory Visit (INDEPENDENT_AMBULATORY_CARE_PROVIDER_SITE_OTHER): Admitting: Urology

## 2025-02-18 ENCOUNTER — Ambulatory Visit (INDEPENDENT_AMBULATORY_CARE_PROVIDER_SITE_OTHER): Admitting: Urology
